# Patient Record
Sex: Female | Born: 1960 | Race: Black or African American | Hispanic: No | Marital: Married | Smoking: Former smoker
Health system: Southern US, Community
[De-identification: ages and names within clinical notes are randomized; demographics above are authoritative.]

## PROBLEM LIST (undated history)

## (undated) DIAGNOSIS — I4891 Unspecified atrial fibrillation: Secondary | ICD-10-CM

## (undated) DIAGNOSIS — I219 Acute myocardial infarction, unspecified: Secondary | ICD-10-CM

## (undated) DIAGNOSIS — I4892 Unspecified atrial flutter: Secondary | ICD-10-CM

## (undated) DIAGNOSIS — E7800 Pure hypercholesterolemia, unspecified: Secondary | ICD-10-CM

## (undated) DIAGNOSIS — I10 Essential (primary) hypertension: Secondary | ICD-10-CM

## (undated) DIAGNOSIS — I509 Heart failure, unspecified: Secondary | ICD-10-CM

## (undated) DIAGNOSIS — Z9289 Personal history of other medical treatment: Secondary | ICD-10-CM

## (undated) DIAGNOSIS — E669 Obesity, unspecified: Secondary | ICD-10-CM

## (undated) DIAGNOSIS — Z951 Presence of aortocoronary bypass graft: Secondary | ICD-10-CM

## (undated) DIAGNOSIS — I2510 Atherosclerotic heart disease of native coronary artery without angina pectoris: Secondary | ICD-10-CM

## (undated) DIAGNOSIS — I214 Non-ST elevation (NSTEMI) myocardial infarction: Secondary | ICD-10-CM

## (undated) HISTORY — DX: Unspecified atrial fibrillation: I48.91

## (undated) HISTORY — DX: Obesity, unspecified: E66.9

## (undated) HISTORY — PX: ABDOMINAL HYSTERECTOMY: SHX81

## (undated) HISTORY — DX: Pure hypercholesterolemia, unspecified: E78.00

## (undated) HISTORY — DX: Personal history of other medical treatment: Z92.89

## (undated) HISTORY — PX: MITRAL VALVE REPAIR: SHX2039

## (undated) HISTORY — DX: Essential (primary) hypertension: I10

## (undated) HISTORY — DX: Presence of aortocoronary bypass graft: Z95.1

## (undated) HISTORY — DX: Atherosclerotic heart disease of native coronary artery without angina pectoris: I25.10

---

## 2000-01-12 ENCOUNTER — Other Ambulatory Visit: Admission: RE | Admit: 2000-01-12 | Discharge: 2000-01-12 | Payer: Self-pay | Admitting: Obstetrics and Gynecology

## 2000-12-27 ENCOUNTER — Ambulatory Visit (HOSPITAL_COMMUNITY): Admission: RE | Admit: 2000-12-27 | Discharge: 2000-12-27 | Payer: Self-pay | Admitting: Obstetrics and Gynecology

## 2000-12-27 ENCOUNTER — Encounter: Payer: Self-pay | Admitting: Obstetrics and Gynecology

## 2000-12-30 ENCOUNTER — Encounter: Admission: RE | Admit: 2000-12-30 | Discharge: 2000-12-30 | Payer: Self-pay | Admitting: Obstetrics and Gynecology

## 2000-12-30 ENCOUNTER — Encounter: Payer: Self-pay | Admitting: Obstetrics and Gynecology

## 2001-07-19 HISTORY — PX: CORONARY ANGIOPLASTY WITH STENT PLACEMENT: SHX49

## 2001-10-13 ENCOUNTER — Encounter: Payer: Self-pay | Admitting: Family Medicine

## 2001-10-13 ENCOUNTER — Encounter: Admission: RE | Admit: 2001-10-13 | Discharge: 2001-10-13 | Payer: Self-pay | Admitting: Family Medicine

## 2002-02-21 ENCOUNTER — Encounter: Payer: Self-pay | Admitting: Interventional Cardiology

## 2002-02-21 ENCOUNTER — Encounter (INDEPENDENT_AMBULATORY_CARE_PROVIDER_SITE_OTHER): Payer: Self-pay | Admitting: Interventional Cardiology

## 2002-02-21 ENCOUNTER — Inpatient Hospital Stay (HOSPITAL_COMMUNITY): Admission: EM | Admit: 2002-02-21 | Discharge: 2002-02-25 | Payer: Self-pay | Admitting: Emergency Medicine

## 2005-07-19 DIAGNOSIS — I219 Acute myocardial infarction, unspecified: Secondary | ICD-10-CM

## 2005-07-19 HISTORY — DX: Acute myocardial infarction, unspecified: I21.9

## 2006-03-19 HISTORY — PX: CORONARY ARTERY BYPASS GRAFT: SHX141

## 2006-03-25 ENCOUNTER — Encounter (INDEPENDENT_AMBULATORY_CARE_PROVIDER_SITE_OTHER): Payer: Self-pay | Admitting: Interventional Cardiology

## 2006-03-25 ENCOUNTER — Inpatient Hospital Stay (HOSPITAL_COMMUNITY): Admission: EM | Admit: 2006-03-25 | Discharge: 2006-04-01 | Payer: Self-pay | Admitting: Emergency Medicine

## 2006-03-28 ENCOUNTER — Encounter: Payer: Self-pay | Admitting: Vascular Surgery

## 2006-03-29 ENCOUNTER — Ambulatory Visit: Payer: Self-pay | Admitting: Dentistry

## 2006-03-30 ENCOUNTER — Ambulatory Visit: Payer: Self-pay | Admitting: Dentistry

## 2006-04-06 ENCOUNTER — Inpatient Hospital Stay (HOSPITAL_COMMUNITY): Admission: RE | Admit: 2006-04-06 | Discharge: 2006-04-13 | Payer: Self-pay | Admitting: Cardiothoracic Surgery

## 2006-08-19 ENCOUNTER — Encounter: Admission: RE | Admit: 2006-08-19 | Discharge: 2006-08-19 | Payer: Self-pay | Admitting: Family Medicine

## 2006-08-25 ENCOUNTER — Ambulatory Visit (HOSPITAL_COMMUNITY): Admission: RE | Admit: 2006-08-25 | Discharge: 2006-08-25 | Payer: Self-pay | Admitting: Family Medicine

## 2006-08-25 ENCOUNTER — Ambulatory Visit: Payer: Self-pay | Admitting: Vascular Surgery

## 2008-09-13 ENCOUNTER — Encounter: Admission: RE | Admit: 2008-09-13 | Discharge: 2008-09-13 | Payer: Self-pay | Admitting: Obstetrics and Gynecology

## 2010-08-09 ENCOUNTER — Encounter: Payer: Self-pay | Admitting: Family Medicine

## 2010-10-28 ENCOUNTER — Other Ambulatory Visit: Payer: Self-pay | Admitting: Obstetrics and Gynecology

## 2010-10-28 DIAGNOSIS — Z1231 Encounter for screening mammogram for malignant neoplasm of breast: Secondary | ICD-10-CM

## 2010-11-03 ENCOUNTER — Ambulatory Visit: Payer: Self-pay

## 2010-12-04 NOTE — Cardiovascular Report (Signed)
NAMEJENAY, [name] NO.:  [id number]   MEDICAL RECORD NO.:  [id number]          PATIENT TYPE:  INP   LOCATION:  3307                         FACILITY:  [facility]   PHYSICIAN:  Lyn Records, M.D.   DATE OF BIRTH:  Dec 11, 1960   DATE OF PROCEDURE:  03/28/2006  DATE OF DISCHARGE:                              CARDIAC CATHETERIZATION   INDICATIONS FOR PROCEDURE:  The patient is 31 and has a prior history of  anterior myocardial infarction in 2003 treated with angioplasty and  stenting.  Over the past 2 years, she has been off of medical therapy  because she has lost her insurance and has been unable to afford her  medications.  For the past 2-3 weeks, she has noticed increasing shortness  of breath and chest tightness and was admitted to the hospital on March 25, 2006, because of CHF and chest discomfort.  Evaluation while hospitalized  demonstrated clinical evidence of significant mitral regurgitation which was  confirmed by echocardiography which demonstrated 3-4+ MR.  There is also a  new lateral wall motion abnormality.  This study is being done to define  coronary anatomy.   PROCEDURES PERFORMED:  1. Left heart catheterization.  2. Selective coronary angiography..  3. Left ventriculography.  4. Right heart catheterization.   DESCRIPTION:  After informed consent, a 6-French sheath was placed in the  right femoral artery using modified Seldinger technique, and a 7-French  sheath in the right femoral vein, again using modified Seldinger technique.  Both sheaths were placed for local Xylocaine infiltration.   Right heart catheterization was performed with a 6-French balloon-tipped  Swan-Ganz catheter.  Thermodilution cardiac outputs were obtained, and Fick  outputs were obtained from the Swan-Ganz catheter and also from an A2  multipurpose catheter which was used for right heart catheterization.  A2  catheter had been advanced to the ascending aorta over a  guidewire.   After hemodynamic measurements were made, we performed selective coronary  angiography using the A2 multipurpose catheter.  We then performed left  ventriculography using an angled pigtail catheter; 40 mL of contrast at 12  mL per second was administered.  This demonstrated severe mitral  regurgitation and decreased LV function.  The procedure was then terminated.  The patient was taken to the holding area for manual compression for  hemostasis.   RESULTS:  1. Hemodynamic data.      a.     Left ventricular pressure 143/27.      b.     Aortic pressure 143/103.      c.     Right atrial mean pressure 15 mmHg.[name]      d.     Right ventricular pressure 64/69.      e.     Pulmonary artery pressure 63/32.      f.     Pulmonary capillary wedge mean pressure 29 with a V-wave to 48       mmHg.      g.     Cardiac output by thermodilution 2.4 liters per minute and by  Fick 2.3 liters per minute.   1. Left ventriculography:  Left ventricle is mildly dilated.  There is      distal inferior wall severe hypokinesis as well as distal anteroapical      hypokinesis.  EF is estimated to be 30-40%.  Mitral regurgitation is      3+.   1. Coronary angiography.      a.     Left main coronary:  Widely patent.      b.     Left anterior descending coronary:  This is large vessel that       wraps around left ventricular apex and gives origin to two diagonals.       The second diagonal is a large vessel.  The LAD contains proximal 50-       60% narrowing.  The mid LAD, which contains a drug-eluting stent,       contains mild to moderate in-stent restenosis with up to 50%       narrowing.  The first diagonal contains a mid vessel 50% narrowing.       The LAD, just at the proximal margin of the stent in the mid LAD,       contains a saccular aneurysm..      c.     Circumflex artery:  Circumflex is totally occluded after a small       first obtuse marginal branch.  The first OM contains an  ostial 70%       narrowing.      d.     Right coronary:  This vessel was moderately diffusely diseased       with 50-60% narrowing proximally and in the mid vessel.   CONCLUSION:  1. Severe mitral regurgitation that is ischemic in origin secondary to      papillary muscle dysfunction.  The mitral regurgitation is greater 3+.      The consequence of this is pulmonary hypertension.  Her presentation      with congestive heart failure is certainly due in part to the severity      of the mitral regurgitation, but also decreased left ventricular      function has some impact.  2. Moderately severe coronary artery disease with totally occluded mid      circumflex, 60-70% obtuse marginal #1, 50-60% proximal and mid left      anterior descending (including in-stent restenosis of the Cypher      stent), and moderate right coronary disease.  3. Decreased left ventricular function with ejection fraction in the 30-      40% range depending upon views and technique used to assess left      ventricle.  The patient has had both an anterior and inferolateral      myocardial infarction in the past.  The inferolateral infarction is the      cause of the patient's severe mitral regurgitation.  4. Pulmonary hypertension secondary to systolic and diastolic dysfunction      as well as severe mitral regurgitation.   PLAN:  1. Intensification of medical therapy.  2. CVTS evaluation for consideration of coronary artery bypass grafting      and mitral valve repair.  3. The patient may need transesophageal echo for further evaluation.      Lyn Records, M.D.  Electronically Signed     HWS/MEDQ  D:  03/28/2006  T:  03/28/2006  Job:  [id number]   cc:   Bryan Lemma. Manus Gunning, M.D.  CVTS

## 2010-12-04 NOTE — Cardiovascular Report (Signed)
[name], [name]                            ACCOUNT NO.:  [id number]   MEDICAL RECORD NO.:  [id number]                   PATIENT TYPE:  INP   LOCATION:  6533                                 FACILITY:  [facility]   PHYSICIAN:  Lesleigh Noe, M.D.            DATE OF BIRTH:  1961/03/20   DATE OF PROCEDURE:  02/22/2002  DATE OF DISCHARGE:                              CARDIAC CATHETERIZATION   INDICATIONS FOR PROCEDURE:  Recent apical infarction documented by EKG and 2-  D echocardiogram.   PROCEDURE PERFORMED:  1. Left heart catheterization.  2. Selective coronary angiography.  3. Left ventriculography.  4. Stent LAD.   DESCRIPTION OF PROCEDURE:  After informed consent, a 6 French sheath was  placed in the right femoral artery using the modified Seldinger technique.  A 6 French _________  catheter  was used for hemodynamic quadrants, left  ventriculography by hand injection, left-to-right selective coronary  angiography.   After studying the cineangiograms, it was felt that intervention on the LAD  which was a covered vessel was indicated.  We changed to a 6 [place]  guide catheter using BMW 0.14 short wire and gave the patient a bolus  followed by an infusion of Angiomax.  The ACT was documented to be greater  than 300 seconds.  An angioplasty was then performed using a 2.5 mm 9 mm  long Maverick balloon.  After predilatation we placed a 13 mm long 2.5 mm  diameter Cyper drug eluting stent to 11 atmospheres.  Because there appeared  to be inadequate stent expansion, we upgraded to a 2.75 mm Maverick 9 mm  long balloon and expanded to 14 atmospheres which was an effective diameter  of 3.1 mm.  With this expansion there appeared to be better stent  apposition.  There was noticeable thrombus in the septal perforator.  TIMI  flow was graded at 2.5 to 3.  The patient was stable without chest pain post  procedure.  Post stent angiographic appearance in the LAD was less than  10%.  ACT post procedure 284 seconds.  A single bolus followed by an infusion of  Integrelin was started because of what appeared to be thrombus within the  septal perforator. The patient tolerated the procedure without  complications.   RESULTS:  1. Hemodynamic data:     a. Aortic pressure 145/89.     b. Left ventricular pressure 143/21 mmHg.  2. Left ventriculography:  There is enteral apical akinesis with an apical     thrombus noted.  Ejection fraction is 55 to 60%.  3. Selective coronary angiography.     a. Left main coronary:  Normal.     b. Left anterior descending coronary artery:  The left anterior        descending gives origin in two diagonal branches, the second diagonal        branch is large and contains 50  to 70% mid stenosis.  The proximal and        mid left anterior descending contain luminal irregularities after the        second septal perforator.  There is 99% stenosis in the left anterior        descending. There is TIMI grade 1 to 2 flow and luminal irregularities        noted beyond the stenosis within the left anterior descending.  The        left anterior descending wraps around the left ventricular apex.     c. Circumflex artery:  Circumflex coronary artery gives origin to three        obtuse marginal branches.  The third obtuse marginal trifurcation on        the inferior wall.  There is an eccentric 50% mid circumflex stenosis        beyond the first obtuse marginal.     d. Right coronary artery:  The right coronary artery contains luminal        irregularities but no significant obstruction. The right coronary        artery is dominant.  4. Percutaneous coronary intervention:  The left anterior descending is     reduced from 99% to less than 10% with TIMI grade 3 flow.  Despite     expansion of the stent to 3.1 mm, there still appears to be some mild     recoil within the stented region, although flow is brisk. There also     appears to be some thrombus  within the ostium of the septal perforator     over which the proximal portion of the stent is deployed.   CONCLUSION:  1. Successful left anterior descending stent from 99% to less than 10% with     TIMI grade 3 flow.  Stenting was done with a drug eluting Cypher stent to     3.1 mm diameter effective orifice.  2. Normal overall left ventricular function with apical akinesis and apical     thrombus noted.  3. Moderate circumflex and mild right coronary disease.   PLAN:  1. Integrelin x18 to 24 hours because of what appears to be thrombus in the     septal perforator.  2. Aspirin and Plavix for three months.  3. Coumadin therapy will be started for the apical thrombus.  4. Close clinical follow-up.                                               Lesleigh Noe, M.D.    HWS/MEDQ  D:  02/22/2002  T:  02/27/2002  Job:  [id number]   cc:   Desma Maxim, M.D.

## 2010-12-04 NOTE — H&P (Signed)
[name], [name]                            ACCOUNT NO.:  [id number]   MEDICAL RECORD NO.:  [id number]                   PATIENT TYPE:  EMS   LOCATION:  MAJO                                 FACILITY:  [facility]   PHYSICIAN:  Salomon Fick, N.P.                 DATE OF BIRTH:  04-19-1961   DATE OF ADMISSION:  02/21/2002  DATE OF DISCHARGE:                                HISTORY & PHYSICAL   PRIMARY CARE PHYSICIAN:  Desma Maxim, M.D.   IMPRESSION:  As dictated by Lyn Records, M.D.  1. Status post anterior myocardial infarction.  Initial event likely nine     days prior to admission.  The first set of cardiac enzymes was elevated,     but likely reflecting tracking downward from initial event.  The EKG was     consistent with old MI in evolution.  A 2-D echocardiogram revealed     severe apical hypokinesis with low-end normal ejection fraction.  The     patient continues with mild left arm tingling and left subscapular     discomfort which is without change from the last nine days.  2. Hypertension.  Good control of blood pressure on current medical regimen.  3. History of tobacco abuse.   PLAN:  As dictated by Lyn Records, M.D.  1. Admit to telemetry.  Continue with serial cardiac enzymes x 3 with daily     EKG.  2. Aspirin, IV nitrates, IV heparin, and Plavix.  3. Cardiac catheterization tomorrow with possible percutaneous intervention     if indicated and able.  The risks, potential complications, benefits, and     alternatives of the procedure were discussed in detail.  The patient has     indicated that her questions and concerns have been addressed and is     agreeable to proceed.   HISTORY OF PRESENT ILLNESS:  The patient is a very pleasant 50 year old with  a history of tobacco use.  Nine days prior to admission, she had an episode  of sudden onset of mid substernal dull achiness which was constant with  subsequent left side of the head, left jaw, left face,  left arm, and left  leg down to feet achiness.  She had associated symptoms of presyncope,  weakness, nausea, diaphoresis, and palpitations.  She denies shortness of  breath.  She felt like she would pass out if she laid down.  She rated the  pain an 8/10 on scale.  The discomfort lasted about 45 minutes.  She had  residual persistent left arm tingling with slight achiness and left  subscapular achiness as well, nonpleuritic and not musculoskeletal.  The  left subscapular ache seemed to be exacerbated with movement of the left  arm.  She had recurrent episode that next day and a latter episode four days  prior to admission.  Otherwise  she feels okay, except for residual left arm  tingling and left subscapular achiness _________  last nine days.  She  presented to her primary M.D. today.  Her EKG changes were consistent with  MI in evolution, inferior/anterior/lateral leads.  A 2-D echocardiogram here  in the emergency room revealed severe hypokinesis of her apex with an EF of  approximately 50%.   PAST MEDICAL HISTORY:  1. Hypertension.  2. Tobacco abuse.   PAST SURGICAL HISTORY:  1. C-section.  2. Hysterectomy without BSO.   ALLERGIES:  No known drug allergies.  Okay with seafood, shellfish, and  iodine products.   MEDICATIONS:  1. Lotensin/HCTZ 20/12.5 mg p.o. q.d.  2. Clarinex one p.o. q.d. for the last two months.  3. Nexium p.r.n.   SOCIAL HISTORY:  Tobacco Use:  One pack per day for the 23 years.  ETOH:  Occasional weekend light use.  She works out of her home for [employer].  Married for 23 years.  A 30 year old son, alive and well.   FAMILY HISTORY:  Her mother is age 33 with no history of CAD and positive  hypertension.  Her father died at age 15 and had cardiac problems and  history of multiple strokes.  Ten siblings, none with CAD.   REVIEW OF SYSTEMS:  Wears glasses.  Episodic GERD.  Otherwise the total  review of systems was benign.   PHYSICAL  EXAMINATION:  As performed by Lyn Records, M.D.  VITAL SIGNS:  Blood pressure 146/102, heart rate 72 and regular, respiratory  rate 20, temperature 97.9 degrees.  GENERAL APPEARANCE:  She is a well-nourished, pleasantly conversant, middle-  aged female in no acute distress with some residual left arm tingling and  left subscapular achiness.  NECK:  Brisk bilateral carotid upstroke without bruit.  No JVD.  No  thyromegaly.  CHEST:  Lung sounds clear with equal bilateral excursion.  CARDIAC:  Regular rate and rhythm without murmur, rub, or gallop.  She does  have a soft S4.  ABDOMEN:  Soft and nondistended.  Normoactive bowel sounds.  Negative  abdominal aorta, renal, or femoral bruit.  Nontender to applied pressure.  No masses.  No organomegaly appreciated.  VASCULAR:  Negative pedal edema.  Distal pulses intact.  NEUROLOGIC:  cranial nerves II-XII grossly intact.  Alert and oriented x 3.  GENITALIA:  Deferred.  RECTAL:  Deferred.   LABORATORY DATA:  The EKG reveals changes consistent with old MI in  evolution involving inferior/anterior/lateral areas.   First CK of 258, MB fraction 11.8, relative index 4.6, and troponin I  elevated at 1.4.   The 2-D echocardiogram revealed severe hypokinesis of the apex, EF low  normal, and otherwise appeared normal.   WBC 9.1, hemoglobin 14.9, hematocrit 43.8, platelets 245.  PT 12.4, INR 0.9,  PTT 27.  Sodium 138, K 3.7, chloride 103, CO2 28, glucose 89, BUN 9,  creatinine 0.9.  LFTs all within normal range, though slightly elevated  alkaline phosphatase at 120.                                               Salomon Fick, N.P.    MES/MEDQ  D:  02/21/2002  T:  02/25/2002  Job:  [id number]

## 2010-12-04 NOTE — Op Note (Signed)
[name], [name]                ACCOUNT NO.:  [id number]   MEDICAL RECORD NO.:  [id number]          PATIENT TYPE:  INP   LOCATION:  3728                         FACILITY:  [facility]   PHYSICIAN:  Charlynne Pander, D.D.S.DATE OF BIRTH:  Dec 16, 1960   DATE OF PROCEDURE:  03/30/2006  DATE OF DISCHARGE:                                 OPERATIVE REPORT   PREOPERATIVE DIAGNOSES:  1. Severe mitral regurgitation.  2. Pre-Mitral valve replacement/repair dental protocol.  3. Coronary artery disease.  4. Chronic apical periodontitis.  5. Chronic periodontitis.  6. Multiple retained root segments.  7. Bilateral mandibular lingual tori.  8. Accretions.   POSTOPERATIVE DIAGNOSES:  1. Severe mitral regurgitation.  2. Pre-Mitral valve replacement/repair dental protocol.  3. Coronary artery disease.  4. Chronic apical periodontitis.  5. Chronic periodontitis.  6. Multiple retained root segments.  7. Bilateral mandibular lingual tori.  8. Accretions.   OPERATIONS:  1. Extraction of tooth numbers 3, 4, 5, 6, 7, 8, 9, 10, 12, 13, 14, 18,      21, 29 and 31.  2. Four quadrants of alveoloplasty.  3. Gross debridement of the remaining dentition.   SURGEON:  Charlynne Pander, D.D.S.   ASSISTANT:  Elliot Dally (Sales executive).   ANESTHESIA:  General anesthesia via an oral endotracheal tube.   MEDICATIONS:  1. Ancef 1 g IV prior to invasive dental procedures.  2. Local anesthesia with a total utilization of 4 carpules each containing      36 mg of Xylocaine with 0.018 mg of epinephrine as well as 2 carpules      each containing 9 mg of bupivacaine with 0.009 mg of epinephrine.   SPECIMENS:  There were 15 teeth, which were discarded.   COMPLICATIONS:  None.   FLUIDS:  700 mL lactated Ringer solution.   ESTIMATED BLOOD LOSS:  100 mL.   INDICATIONS:  The patient was recently diagnosed with severe mitral  regurgitation as well as coronary artery disease.  Patient with anticipated  mitral valve replacement/repair heart surgery along with a coronary artery  bypass graft procedure with Dr. Donata Clay.  Dental consultation was  requested to rule out dental infection which would affect the patient's  systemic health and anticipated heart valve surgery.  The patient was  examined and treatment planned for multiple extractions with alveoplasty and  gross debridement of the remaining dentition.  Preprosthetic surgery of the  bilateral mandibular lingual tori would be evaluated at the time of surgery.  This treatment plan was formulated, again, to decrease the risks and  complications associated with dental infection from an affecting the  patient's systemic health.   OPERATIVE FINDINGS:  The patient was examined in operating room #10.  The  teeth were identified for extraction.  The patient was noted to be affected  by chronic apical periodontitis, chronic periodontitis, multiple retained  root segments, accretions, and the presence of small bilateral mandibular  lingual tori.  It was determined that the mandibular lingual tori would be  left as is, as a mandibular cast partial denture should be able to be  fabricated around these small mandibular tori.  The aforementioned,  therefore, necessitated the removal of all the remaining upper teeth as well  as tooth numbers 18, 21, 29 and 31 with alveoloplasty and gross debridement  of the remaining dentition.  No mandibular tori reductions would be  performed at this time.   DESCRIPTION OF PROCEDURE:  The patient was brought to the main operating  room #10.  The patient was then placed in a supine position on the operating  room table.  General anesthesia was induced via an oral endotracheal tube.  A throat pack was placed at this time.  The patient was then prepped and  draped in usual manner for a dental medicine procedure.  The oral cavity was  thoroughly examined with the findings as noted above.  The patient was then   ready for the dental medicine procedure as follows:   Local anesthesia was administered sequentially over the dental procedure  with a total utilization of 4 carpules each containing 36 mg Xylocaine with  0.018 mg of epinephrine as well as 2 carpules each containing 9 mg of  bupivacaine with 0.009 mg of epinephrine.   The maxillary quadrants were first approached.  Anesthesia was achieved  utilizing the Xylocaine with epinephrine.  A 15 blade incision was made from  the distal of #2 and extended to the distal of #15.  A surgical flap was  then carefully reflected.  The maxillary teeth were then subluxated with a  series of straight elevators.  Appropriate amounts of buccal and interseptal  bone were removed with the surgical handpiece and bur and copious amounts  sterile saline as indicated.  Tooth numbers 3, 4, 5, 6, 7, 8, 9, 10, 12, 13  and 14 were then removed with a 150 forceps without complications.  Alveoplasty was then performed utilizing rongeurs and bone file.  These  surgical sites were then irrigated with copious amounts of sterile saline.  A piece of Surgicel was then placed carefully into each extraction socket  appropriately.  Maxillary right surgical site was then closed utilizing 3-0  chromic gut suture from the distal of #2 and extended to the mesial of #8.  The maxillary left quadrant was then closed from the distal of #15 to the  mesial of #9 utilizing 3-0 chromic gut suture in a continuous interrupted  suture technique x1.   At this point time the mandibular arches were approached.  The patient was  given bilateral inferior alveolar nerve blocks utilizing the bupivacaine  with epinephrine.  Further infiltration was then achieved utilizing  Xylocaine with epinephrine.  A 15 blade incision was made from the distal of  #17 and extended to the mesial of #21.  A surgical flap was then carefully  reflected.  Appropriate amounts of buccal and interseptal bone were  removed around tooth #17 and 21 appropriately.  The coronal segment of tooth #17 was  removed at this time.  Tooth #21 was then removed with a 151 forceps without  complications.  Tooth #17 was then sectioned with a surgical handpiece and  bur and copious amounts sterile saline.  The roots were then elevated out  separately with a Theatre manager.  Alveoplasty was then performed utilizing  rongeurs and bone file.  The surgical site was then irrigated with copious  amounts of sterile saline.  The tissues were approximated and trimmed  appropriately.  A piece of Surgicel was placed in the extraction site of #17  and #21 appropriately.  The surgical site was then closed from the distal of  #17 and extended to the mesial of #21 utilizing 3-0 chromic gut suture in a  continuous interrupted suture technique x1.  The mandibular right quadrant  was then approached.  The root segments in the area of #29 and 31 were then  removed with a rongeur appropriately.  There were no complications.  These  sockets were curetted and compressed appropriately.  The extraction site was  then irrigated with copious amounts of sterile saline.  A piece of Surgicel  was then placed in each extraction socket appropriately.  The surgical site  #29 was then closed utilizing 3 separate interrupted sutures.  The  extraction site #31 was then closed utilizing 2 separate interrupted  sutures.   At this point in time the remaining teeth were approached.  A KaVo [employer]  Scaler was then utilized to remove accretions around tooth numbers 20  through 27 appropriately.  A series of hand curettes were then utilized to  further complete the debridement.  The KaVo [employer] was then again  utilized to refine the removal of the accretions.  At this point in time the  entire mouth was irrigated with copious amounts of sterile saline.  The  patient was examined for complications.  Seeing none, the dental medicine  procedure was  deemed to be complete.  The throat pack was removed at this  time.  A series of 4x4 gauzes was placed in the mouth to aid hemostasis.  The patient was then handed off to the anesthesia team for final  disposition.  After the appropriate amount of time the patient was extubated  and taken to the post anesthesia care unit with stable vital signs and a  good oxygenation level.  All counts were correct for the dental medicine  procedure.  The patient will be given Amicar 5% rinses, rinsing with 10 mL  every hour for the next 10 hours.  This will aid hemostasis and prevent  fibrinolysis.  The patient will then be followed and Lovenox therapy will be  reinstituted per the medical team.  The patient will be followed for healing  and then mitral valve replacement heart surgery will proceed with Dr. Donata Clay as per his scheduling.      Charlynne Pander, D.D.S.  Electronically Signed     RFK/MEDQ  D:  03/30/2006  T:  03/31/2006  Job:  [id number]   cc:   Lyn Records, M.D.  Kerin Perna, M.D.

## 2010-12-04 NOTE — Consult Note (Signed)
[name], [name]                ACCOUNT NO.:  [id number]   MEDICAL RECORD NO.:  [id number]          PATIENT TYPE:  INP   LOCATION:  3728                         FACILITY:  [facility]   PHYSICIAN:  Charlynne Pander, D.D.S.DATE OF BIRTH:  Dec 02, 1960   DATE OF CONSULTATION:  03/29/2006  DATE OF DISCHARGE:                                   CONSULTATION   [name] is a 50 year old female referred by Dr. Kathlee Nations Thomas for  dental consultation.  Patient with recent identification of severe mitral  regurgitation.  Patient with anticipated mitral valve replacement along with  coronary artery bypass graft procedure.  The patient now seen as part of a  pre-heart valve surgery dental protocol evaluation.   MEDICAL HISTORY.:  1. Severe mitral regurgitation requiring mitral valve replacement or      repair with Dr. Donata Thomas.  2. Class IV congestive heart failure.  3. Coronary artery disease.      a.     History of apical MI in 2003.      b.     Status post drug-eluting stent placement to the LAD in 2003 by       Dr. Verdis Thomas.      c.     Recent cardiac catheterization which revealed severe mitral       regurgitation, coronary artery disease and ejection fraction of 30-       40%.      d.     Patient with anticipated mitral valve replacement and coronary       artery bypass graft heart procedure with Dr. Donata Thomas.  4. History of hypertension.  5. Hyperlipidemia.  6. Status post hysterectomy.   ALLERGIES:  ACE INHIBITORS cause a cough.   MEDICATIONS:  1. Aspirin 325 mg daily.  2. Lipitor 40 mg every evening.  3. Coreg 3.125 mg twice daily.  4. Lasix 40 mg twice daily.  5. Heparin per protocol.  6. Avapro 3 mg daily.  7. Imdur 60 mg daily.  8. K-Dur 20 mEq daily.   SOCIAL HISTORY:  The patient is married.  The patient to quit smoking  approximately 2 years ago.  The patient is a nondrinker.  The patient has a  80 year old son.   FAMILY HISTORY:  Is positive for diabetes  mellitus, hypertension and a  stroke.   FUNCTIONAL ASSESSMENT:  The patient was independent for ADLs prior to this  admission.   REVIEW OF SYSTEMS:  This is reviewed from the chart and Health and History  Assessment form for this admission.   DENTAL HISTORY.:   CHIEF COMPLAINT:  The patient needs a pre-heart valve surgery dental  protocol evaluation.   HISTORY OF PRESENT ILLNESS:  Patient with recent identification of severe  mitral regurgitation requiring a mitral valve repair replacement with Dr.  Donata Thomas.  The patient is now seen as part of a pre-heart valve surgery  dental protocol evaluation to rule out dental infection which may affect the  patient's systemic health and anticipated heart valve surgery.   The patient currently denies acute toothache, swellings  or abscesses.  The  patient was last seen approximately 2 years ago to have a tooth extracted.  The patient denies complications from that dental extraction.  The patient  does not have a regular dentist.  The patient has no dentures.   PHYSICAL EXAMINATION:  GENERAL:  The patient is well-developed, well-  nourished female in no acute distress.  VITAL SIGNS:  Blood pressure is 132/83, pulse equal to 104. Respirations are  22, temperature 97.7.  HEAD AND NECK: There is no palpable lymphadenopathy.  There are no acute TMJ  symptoms.   DENTAL EXAM:  Intraoral Exam: The patient has normal saliva.  The patient  has small bilateral mandibular lingual tori. There is no evidence of abscess  formation at this time.  DENTITION.  Patient with multiple missing teeth.  Patient with multiple  retained root segments.  PERIODONTAL: Patient with chronic advanced periodontal disease with plaque  and calculus accumulations, generalized gingival recession, generalized  tooth mobility, and moderate to severe bone loss.  DENTAL CARIES:  There are dental caries noted within the mouth.  CROWN OR BRIDGE:  There are no crown or bridge  restorations.  ENDODONTIST: The patient currently denies acute pulpitis symptoms.  The  patient does have multiple areas of chronic apical periodontitis.  PROSTHODONTIC: There are no dentures.  OCCLUSION: Patient with a poor occlusal scheme secondary to multiple missing  teeth, multiple retained root segments, supereruption and drifting of the  unopposed teeth into the edentulous areas, and lack of replacement of the  missing teeth with dental prosthesis.  [name]   RADIOGRAPHIC IRRITATION:  Panoramic x-ray was taken in the Department of  Radiology on March 29, 2006.   There are multiple missing teeth.  There are multiple retained root  segments.  There is moderate to severe bone loss.  There are dental caries  noted.  There is poor occlusal scheme noted.  There is evidence of chronic  apical periodontitis.   ASSESSMENT:  1. Plaque and calculus accumulations.  2. Chronic periodontitis bone loss.  3. Gingival recession.  4. Tooth mobility.  5. Multiple missing teeth.  6. Multiple retained root segments.  7. Dental caries.  8. Bilateral mandibular lingual tori.  9. Supereruption and drifting of the unopposed teeth into the edentulous      areas.  10.Poor occlusal scheme.  11.History of oral neglect.  12.Heparin therapy with a risk for bleeding with invasive dental      procedures.  13.Need for antibiotic premedication prior to invasive dental procedures.   PLAN AND RECOMMENDATIONS:  1. I have discussed the risks, benefits, complications of various      treatment option with the patient in relationship to patient's medical      and dental conditions and anticipated heart valve surgery, and risk for      subacute bacterial endocarditis.  We discussed various treatment      options to include no treatment, total and subtotal extractions with      alveoplasty, periodontal therapy, dental restorations, crown or bridge     therapy, root canal therapy, implant therapy, replacing missing  teeth      as indicated after adequate healing.  Patient currently wishes to      proceed with extraction of the remaining upper teeth and the lower      teeth with the exception of tooth numbers 22-27.  The patient also      agrees to alveoloplasty, preprosthetic surgery as indicated, and gross  debridement of the remaining dentition.  This will take place in the      operating room under general anesthesia.  Patient is aware of the      significant risks up to including death due to significant      cardiovascular compromise.  The operating room procedure will be      scheduled as soon as possible pending operating room availability.  2. Discussion of findings with Dr. Verdis Thomas and Dr. Kathlee Nations Thomas as      indicated.      Charlynne Pander, D.D.S.  Electronically Signed     RFK/MEDQ  D:  03/29/2006  T:  03/29/2006  Job:  [id number]   cc:   Kerin Perna, M.D.  Lyn Records, M.D.

## 2010-12-04 NOTE — H&P (Signed)
NAMESHALEKA, BRINES NO.:  [id number]   MEDICAL RECORD NO.:  [id number]          PATIENT TYPE:  INP   LOCATION:  1846                         FACILITY:  [facility]   PHYSICIAN:  Lyn Records, M.D.   DATE OF BIRTH:  1961-04-10   DATE OF ADMISSION:  03/25/2006  DATE OF DISCHARGE:                                HISTORY & PHYSICAL   PRIMARY CARE PHYSICIAN:  Donia Guiles, M.D.   CARDIOLOGIST:  Lyn Records, M.D.   CHIEF COMPLAINT:  Shortness of breath and chest tightness.   HPI:  Ms. Barman is a 50 year old African-American female who is well known  to Dr. Katrinka Blazing.  She has a known history of an anterior MI, single vessel  coronary artery disease status post LAD stent implantation with moderate  circumflex and RCA disease, hypertension, and dyslipidemia.  The patient  reports medication noncompliance for the past year and a half to two years  secondary to financial difficulty.  Today she presented to her primary care  physician's office with a chief complaint of shortness of breath and chest  tightness for the past 2 weeks.  Her primary care physician referred her to  the [facility] Emergency Department.  The shortness of breath and chest  tightness was sudden in onset.  The patient admits to orthopnea (sleeping in  a chair or on her knees for the past week), PND for the past week, lower  extremity edema, however denies short term weight gain.  She also admits to  a nonproductive cough, denies fever or chills.  She denies a previous  history of this problem.  The patient's chest tightness is located above her  left breast and radiates to the left shoulder and back.  She admits to heat  (questionable diaphoresis), nausea and vomiting for the past 4 days without  diarrhea, and dizziness (lightheadedness), however, denies syncope.  Chest  tightness is rated a 4/10 and is described as a dull ache.  She admits to  chest tightness upon arrival to the emergency department  today; however,  admits that the tightness is less severe.  There is no change in the chest  tightness with movement or meals.  Upon arrival to the emergency department,  a 12 lead EKG was obtained and revealed normal sinus rhythm at 89 b.p.m.  with nonspecific ST-T wave abnormalities.  No ischemic changes were noted.  Point of care enzymes were obtained and were negative x1 except a mildly  elevated troponin I at 0.10.  A chest x-ray was obtained and revealed  bibasilar atelectasis with airspace disease right greater than left.  Right  lower lobe pneumonia could not be excluded.  Normal LV systolic function  with an EF of 55-65%.  Severe hypokinesis of apical anterior wall.  LV wall  thickness mildly to moderately increased via 2D echocardiogram February 21, 2002.   PAST MEDICAL HISTORY:  1. Status post anterior MI August, 2003.  2. Single vessel coronary artery disease status post LAD stent      implantation with moderate circumflex and RCA disease February 22, 2002.  3. Hypertension.  4. Dyslipidemia.  5. History of tobacco abuse with cessation in 2004.   ALLERGIES:  ACE INHIBITOR - COUGH.   MEDICATIONS:  None.   PAST SURGICAL HISTORY:  1. Cesarean section.  2. Partial hysterectomy without BSO.   FAMILY HISTORY:  1. Father deceased age 83, hypertension, diabetes mellitus, CVA.  2. Mother living, age 51, hypertension.  3. Ten siblings - none with coronary artery disease.  4. Son age 68, healthy.   SOCIAL HISTORY:  Married.  Lives with husband and son.  Employed for a [employer].  Former tobacco abuse with a 25 pack year history with  smoking cessation in 2004.  Positive occasional alcohol use on the weekends.  Denies illicit drug use and a consistent exercise regimen.   REVIEW OF SYSTEMS:  All other systems reviewed are negative, other than what  is stated in the HPI.   PHYSICAL EXAM:  GENERAL:  A 50 year old female, pleasant and cooperative,  NAD.  VITALS:   Temperature 97.1, pulse 87, blood pressure 159/115, repeat blood  pressure 143/98, respiration 28, repeat respirations 24, O2 saturation 98%  over 2L.  HEENT:  Unremarkable.  NECK:  Supple without JVD, carotid bruits bilaterally, or thyromegaly.  Carotid upstrokes 2+.  LUNGS:  Bilateral crackles throughout.  HEART:  Regular rate and rhythm.  Normal S1 and S2 with a I/VI systolic  murmur.  ABDOMEN:  Soft, nontender, nondistended with active bowel sounds.  No  masses, hepatomegaly, or bilateral bruits noted.  EXTREMITIES:  No peripheral edema.  DP and PT pulses 2+/2 bilaterally.  SKIN:  Warm and dry without rashes or lesions.  NEURO:  Alert and oriented x3.  No focal deficits.  PSYCH:  Normal mood and affect.  BACK:  No kyphosis or scoliosis.   LABORATORY DATA:  White blood count 8.6, hemoglobin 12.9, hematocrit 38.7,  platelets 298.  Sodium 138, potassium 3.7, chloride 106, bicarbonate 22.5,  BUN 12, creatinine 1.1, glucose 103.  Point of care enzymes:  Myoglobin  72.8, CK MB 2.6, troponin I 0.1.  BNP, PT, PTT, INR, and D-dimer are  pending.  EKG and chest x-ray as stated in the HPI.   ASSESSMENT:  1. Dyspnea.  2. Chest tightness consistent with unstable angina.  3. Nonproductive cough.  Afebrile.  4. Nausea.  5. Vomiting.  6. Hypertension.  7. Heart murmur.  8. Coronary artery disease status post left anterior descending stent      implantation with moderate circumflex and right coronary artery      disease.  9. Status post anterior myocardial infarction.  10.Dyslipidemia.  11.History of tobacco abuse with cessation in 2004.  12.Medication noncompliance secondary to financial issues.   PLAN:  1. Admit to cardiac telemetry unit under the service of Dr. Verdis Prime.  2. Obtain cardiac panel with troponin-I every 8 hours x3.  First set now.  3. Diet 2 g sodium modified.  4. Medications.      a.     Start aspirin 325 mg daily.      b.     IV Lasix 40 mg q.12h.     c.      K-Dur 20 mEq now and daily.      d.     Coreg 3.125 mg twice daily.  Hold for systolic blood pressure       less than 100 mmHg or heart rate less than 60.      e.     Lipitor 40 mg  daily.  5. Obtain daily weights and strict I/O's.  6. Initiate cardiology p.r.n. orders.  7. Oxygen at 2L per minute.  8. Admit to Step Down Unit.  9. Obtain a STAT 2D echocardiogram for the diagnosis of dyspnea and heart      murmur.  10.Check FLP, EKG, BMET, and CBC in the a.m.  11.The patient was seen, interviewed and examined by Dr. Verdis Prime who      participated in the medical decision-making and plan of care.      Tylene Fantasia, [place]      Lyn Records, M.D.  Electronically Signed    RDM/MEDQ  D:  03/25/2006  T:  03/25/2006  Job:  [id number]   cc:   Donia Guiles, M.D.

## 2010-12-04 NOTE — Discharge Summary (Signed)
[name], [name]                ACCOUNT NO.:  [id number]   MEDICAL RECORD NO.:  [id number]          PATIENT TYPE:  INP   LOCATION:  2025                         FACILITY:  [facility]   PHYSICIAN:  Kerin Perna, M.D.  DATE OF BIRTH:  [birth year]   DATE OF ADMISSION:  04/06/2006  DATE OF DISCHARGE:  04/13/2006                                 DISCHARGE SUMMARY   ADMIT DIAGNOSIS:  Coronary artery disease and mitral regurgitation.   PAST MEDICAL HISTORY AND DISCHARGE DIAGNOSES:  1. Coronary artery disease status post drug-alluding stent to the LAD in      2003.  Status post coronary artery bypass grafting x4.  2. Ischemic mitral regurgitation status post mitral valve annuloplasty.  3. Ischemic cardiomyopathy.  4. Hypertension.  5. Hyperlipidemia.  6. History of tobacco abuse.  7. Hysterectomy.  8. Postoperative left pleural effusion status post ultrasound-guided      thoracentesis.  9. Pulmonary hypertension.   ALLERGIES:  ACE INHIBITORS CAUSE A COUGH.   BRIEF HISTORY:  The patient is a 50 year old African-American female with a  history of myocardial infarction in 2003 treated with PCI of the LAD.  She  recently returned with symptoms of CHF and was found to be in significant  class IV heart failure with reduced left ventricular function and ejection  fraction of 20% with 3+ moderate to severe mitral regurgitation.  The  patient was admitted and stabilized.  She was then taken for cardiac cath on  March 28, 2006 which demonstrated multi-vessel coronary artery disease  and moderate to severe mitral regurgitation.  Secondary to these findings,  Dr. Kathlee Nations Trigt of the CVTS service was consulted and he evaluated the  patient on March 28, 2006.  It was his opinion the patient should  proceed with coronary artery bypass grafting as well as mitral valve repair,  however, the patient would require a dental examination prior to valve  surgery.  This is carried out and the  patient underwent several tooth  extractions.  In order to give the patient time to heal from her oral  surgery, she was allowed to be discharged home.  The patient was then  readmitted for cardiac surgery.   HOSPITAL COURSE:  The patient was admitted and taken to the OR on April 06, 2006 for coronary artery bypass grafting x4 and a mitral valve  annuloplasty with a 26 mm Edwards Ring.  The left internal mammary artery  was grated to the LAD, saphenous vein was grafted to the diagonal, saphenous  vein was grafted to the diagonal, saphenous vein was grafted to the  circumflex marginal, and saphenous vein was grated to the right coronary  artery.  Endoscopic vessel harvesting was performed of the right lower  extremity.  The patient tolerated the procedure well, was hemodynamically  stable immediately postoperatively.  The patient was transferred from the OR  to the [facility] in stable condition.  The patient was extubated without  complication and woke up from anesthesia neurologically intact.   The patient's postoperative course progressed as expected.  On postoperative  day one  she was afebrile with stable vital signs and maintaining a normal  sinus rhythm.  All invasive lines and chest tubes were discontinued in a  routine manner.  The patient has been volume overloaded and has been  diuresed accordingly.  The patient began cardiac rehab on postoperative day  two and has continued to increase her tolerance with a satisfactory level at  this time.  She was noted to have a left pleural effusion on postoperative  day three.  This was monitored for several days and did require a left  thoracentesis in interventional radiology on March 22, 2006.  The patient  tolerated this procedure well and has experienced no difficulties from it.   The remainder of the patient's postoperative course progressed without  difficulty.  On postoperative day seven she is ambulating well and her bowel   function has returned.  She is without complaint.  She is afebrile with  stable vital signs and maintained on normal sinus rhythm.   PHYSICAL EXAMINATION:  Cardiac is regular rate and rhythm.  There are  decreased breath sounds in the bilateral bases.  Abdomen is benign.  Extremities reveal positive edema, right greater than left.  Incision is  clean, dry, and intact.  The patient has recovered well.  Her postoperative  pleural effusion and thoracentesis.  She continues to be volume overloaded  and require further diuresis as an outpatient.  The patient has progressed  and is in stable condition for discharge at this time.   LABS:  INR on April 13, 2006 2.3.  BMP on April 11, 2006.  Sodium  155, potassium 0.6, BUN 17, creatinine 1.1  CBC on April 10, 2006 white  count is 12.5, hemoglobin 9.7, hematocrit 28.5, platelets 230.   CONDITION ON DISCHARGE:  Improved.   INSTRUCTIONS:   MEDICATIONS:  1. Aspirin 81 mg daily.  2. Coreg 6.25 mg b.i.d.  3. Avapro 150 mg daily.  4. Lipitor 40 mg nightly.  5. Amiodarone 200 mg b.i.d.  6. Coumadin 2.5 mg nightly.  7. Lasix 40 mg daily x7 days.  8. K-Dur 20 mEq daily for seven days.  9. Tylox one to two q.4h-q.6h. p.r.n. pain.   The patient was given specific instructions regarding activity, diet, and  wound care.   FOLLOWUP:  1. PT INR blood work on April 18, 2006 at [facility] at 2:30 p.m.  2. EKG on April 19, 2006 at 9:30 a.m. at [facility].  3. Chest x-ray at [facility] on April 27, 2006 at 10 a.m.  4. Dr. Katrinka Blazing on April 27, 1006 at 10:30 a.m.  5. Dr. Donata Clay three weeks after discharge.  His office will consult the      patient with the date and time of that appointment.      Pecola Leisure, [place]      Kerin Perna, M.D.  Electronically Signed    AY/MEDQ  D:  04/13/2006  T:  04/13/2006  Job:  [id number]   cc:   Kerin Perna, M.D. Katrinka Blazing, M.D.

## 2010-12-04 NOTE — Discharge Summary (Signed)
[name], [name]                            ACCOUNT NO.:  [id number]   MEDICAL RECORD NO.:  [id number]                   PATIENT TYPE:  INP   LOCATION:  3742                                 FACILITY:  [facility]   PHYSICIAN:  Lyn Records, MD                  DATE OF BIRTH:  11-18-1960   DATE OF ADMISSION:  02/21/2002  DATE OF DISCHARGE:  02/25/2002                                 DISCHARGE SUMMARY   PROCEDURES:  1. (02/21/2002) A 2-D echocardiogram revealing severe apical hypokinesia.  Low     to normal ejection fraction.  2. (02/22/2002)  Stent to the left anterior descending artery with Cypher drug     eluting stent.  Left ventriculogram was notable for apical akinesia with     thrombus.  Left vein normal, left anterior descending artery 99% mid     after ST #1, 70 diagonal, circumflex 50 to 70% mid stenosis.  Right     coronary artery had luminal irregularities.   DISCHARGE DIAGNOSES:  1. Status post apical wall myocardial infarction with initial event     approximately nine days prior to admission.  The first set of cardiac     enzymes were elevated with CK 58, MB fraction 1.8, troponin I 1.41;     second troponin I elevated at 2.29 but tracking down to CK/MB fraction.     Initial electrocardiogram was consistent with old myocardial infarction     in evolution.  A 2-D echocardiogram in the emergency room revealed severe     apical hypokinesis with low to normal ejection fraction.  The patient was     initially treated with aspirin, intravenous nitrates, intravenous     heparin, and Plavix.  On 02/22/2002, she was taken to the cardiac     catheterization laboratory with results as detailed above.  Underwent     successful percutaneous transluminal coronary angioplasty and drug     eluting Cypher stent mid left anterior descending artery.  Residual 70%     diagonal, 50 to 70% mid circumflex.  She was treated with Integrelin for     approximately 18 hours secondary to thrombus and  ST#1 .  As well, went     with cardiac rehabilitation without problems.  No evidence of congestive     heart failure.  Discharged home on 02/25/2002 in stable condition.  2. Ischemic left ventricular dysfunction secondary to anterior wall     myocardial infarction with left ventriculogram revealing apical akinesia     with thrombus.  Ejection fraction 60%.  She was initially placed on     intravenous heparin.  Post catheterization, started on Coumadin in     anticipation of an three-month course for treatment of the left     ventricular apical thrombus.  At time of discharge, her INR was 1.8.  She     will follow  up with Dawn in our Coumadin clinic with subsequent     adjustment of Coumadin pending INR checks thereafter.  3. Risk factor modification in the setting of coronary artery disease:     History of tobacco abuse at one pack per day for approximately 23 years.     She met with smoking cessation Counsellor who felt patient was in the     contemplation stage.  Counsellor encouraged patient to develop a firm     mind set to quit and will be followed up with private phone call to     patient after discharge.  4. History of hypertension.  Blood pressure within acceptable range during     the course of admission.  5. Insomnia.  Discharged on p.r.n. Ambien.   PLAN:  The patient is discharged home in improved condition.   DISCHARGE MEDICATIONS:  1. (New) Plavix 75 mg 1 p.o. q.d. for six months.  2. (New) Aspirin 81 mg, coated, 1 a day forever.  3. (New) Coumadin 10 mg p.o. on day of discharge (two 5 mg tablets), then     one 5 mg tablet p.o. q.d. thereafter or as otherwise directed.  4. Lotensin/HCTZ 10/12.5 p.o. q.d.  5. (New) Lopressor 50 mg 1/2 tablet p.o. b.i.d.  6. (New) Nitroglycerin tablets 0.4 mg sublingual p.r.n. chest pain, up to 3     tablets in 15 minutes.  7. (New) Ambien 5 mg p.o. q.h.s. p.r.n. insomnia.   DIET:  Low-fat, low-cholesterol, no-added-salt.   SPECIAL  INSTRUCTIONS:  Stop smoking.   FOLLOW UP:  Coumadin clinic at [facility], Tuesday post discharge.  She will follow up with Dr. Katrinka Blazing in approximately two weeks.  (Our office  will call her with that followup office visit.)   HISTORY OF PRESENT ILLNESS:  The patient is an extremely pleasant 50-year-  old female with history of hypertension and tobacco abuse as well as  positive family history for CAD.  On the day prior to admission, she had an  episode of sudden onset mid substernal dull ashiness which was constant with  subsequent left side of head, left jaw, left face, left arm, and left leg  down to feet ashiness with associated symptoms of presyncope, weakness,  nausea, diaphoresis, and palpitations.  She denies shortness of breath.  She  felt like she would pass  out if she did not lie down.  Pain was rated at 8  out of 10 on pain scale.  Discomfort lasted approximately 45 minutes.  She  had residual persistent left arm tingling with slight achiness and left  subscapular achiness as well.  Discomfort was not pleuritic nor  musculoskeletal.  The left subscapular achiness seemed to be exacerbated  with movement of the left arm.  She had a recurrent episode the next day and  later episode four days prior to admission.  Otherwise she felt okay except  for residual left arm tingling and left subscapular achiness, pretty much  consistent over the last nine days.  She saw her primary care Jerad Dunlap where  EKG revealed changes consistent with MI in evolution.  This involved the  inferior, anterior, and lateral leads.  A 2-D echocardiogram in the  emergency room revealed severe hypokinesis of her apex with EF approximately  50%.  Her first CK was elevated at 258, MB fraction 11.8, relative index  4.6, and troponin I elevated 1.4.  She was started on IV nitrates, aspirin,  heparin, and Plavix.  Further details of hospital  admission as above.  LABORATORY TESTS:  Admission WBC 9.1,  hemoglobin 14.9, hematocrit 43.8,  platelets 245.  Admission coags within normal range; discharge pro time  19.7, INR 1.8, PTT 38.  Admission sodium 138, potassium 3.7, chloride 102,  CO2 28, glucose 89, BUN 9, creatinine 0.9.  LFTs all within normal range,  though alkaline phosphatase slightly elevated at 120.  First CK 258, MB  fraction 11.8, MB fraction 1.41.  Second CK 204, MB fraction 7.1, troponin I  2.29.  Cholesterol 167, triglycerides 139, HDL 47, LDL 92.   Admission EKG revealed changes consistent with old MI in evolution  involving inferior, anterior, and lateral leads.  Normal sinus rhythm.   Total time preparing discharge greater than 40 minutes including dictating  discharge summary, filling and reviewing discharge instructions with patient  and setting up followup office visits.      Salomon Fick, NP                         Lyn Records, MD    MES/MEDQ  D:  04/05/2002  T:  04/09/2002  Job:  [id number]   cc:   Desma Maxim, M.D.

## 2010-12-04 NOTE — Op Note (Signed)
NAMEBONNY, VANLEEUWEN                ACCOUNT NO.:  [id number]   MEDICAL RECORD NO.:  [id number]          PATIENT TYPE:  INP   LOCATION:  2316                         FACILITY:  [facility]   PHYSICIAN:  Kerin Perna, M.D.  DATE OF BIRTH:  1961/04/01   DATE OF PROCEDURE:  04/06/2006  DATE OF DISCHARGE:                                 OPERATIVE REPORT   OPERATION:  1. Coronary artery bypass grafting x4 (left internal mammary artery to      left anterior descending, saphenous vein graft to diagonal, saphenous      vein graft to circumflex marginal, saphenous vein graft to right      coronary artery).  2. Mitral valve annuloplasty with a 26-mm Edwards ring, model 4100, serial      number [id number] (McCarthy-Adams).   SURGEON:  Kerin Perna, MD   ASSISTANT:  Danelle Earthly, PA-C   ANESTHESIA:  General by Bedelia Person, MD   PREOPERATIVE DIAGNOSES:  1. Ischemic mitral regurgitation.  2. Severe three-vessel coronary disease.  3. Ischemic cardiomyopathy.   POSTOPERATIVE DIAGNOSES:  1. Ischemic mitral regurgitation.  2. Severe three-vessel coronary disease.  3. Ischemic cardiomyopathy.   INDICATIONS:  The patient is a 50 year old female with a history of an MI in  2003 treated with a PCI of the LAD.  She returned with symptoms of CHF and  was found to be an significant class IV heart failure with reduced LV  function, EF of 20% and 3+ moderate to severe mitral regurgitation.  Cardiac  catheterization demonstrated a recent occlusion of her circumflex with a  subtotal stenosis of the LAD stent and 75% stenosis of the right coronary.  She had pulmonary hypertension by right heart cath and it was felt she would  benefit from surgical revascularization and mitral valve repair.  Prior to  surgery, she was examined by the dentist, who extracted several necrotic  teeth, and while she was recovering from her oral surgery, she was  maintained on Coreg, Avapro, Lasix, and Lipitor.  After recovering  from her  oral surgery, she now presents for surgical coronary revascularization and  mitral valve repair.  I examined the patient in the hospital on several  occasions and reviewed results the cardiac cath and echo with the patient  and discussed CABG with mitral valve repair including the alternatives to  surgery, the details of the operation and the risks involved.  The risks  that we specifically discussed were the risks of MI, CVA, bleeding, blood  transfusion requirement, infection, and death.  After our discussions, she  demonstrated her understanding of the operation and agreed to proceed with  what I felt was an informed consent.   OPERATIVE FINDINGS:  The patient has significant pulmonary hypertension with  PA pressures of 60/30.  She has severe global LV dysfunction and scar was  determined to be present in the anterior and inferior walls.  The  transesophageal echo documented the moderate to severe mitral regurgitation  and after the angioplasty repair, there was no residual mitral  regurgitation.  The patient was given the aprotinin protocol  for this  operation due to her severe LV dysfunction and risk for bleeding.  She  received 2 units of packed cells during the operation to maintain a  hemoglobin over 8 g.   PROCEDURE:  The patient was brought to the operating room and placed supine  on the operating table, where general anesthesia was induced under invasive  hemodynamic monitoring.  The chest, abdomen and legs were prepped with  Betadine and draped as a sterile field.  A sternal incision was made as the  saphenous vein was harvested endoscopically from the right thigh.  The left  internal mammary artery was harvested as a pedicle graft from its origin at  the subclavian vessels.  It was a good vessel with excellent flow.  Heparin  was administered and ACT was documented as being therapeutic.  The sternal  retractor was placed and the pericardium was opened and  suspended.  Pursestrings were placed in the ascending aorta and right atrium and the  patient was cannulated and placed on bypass.  A second pursestring was  placed in the lower right atrium for bicaval venous drainage.  Vesseloops  were placed around the IVC and SVC and the interatrial groove was dissected  out.  The coronaries were identified for grafting and the mammary artery and  vein grafts were prepared for the distal anastomoses.  Cardioplegia  catheters were placed for both antegrade and retrograde cold blood  cardioplegia.  The patient was cooled to 32 degrees and the aortic  crossclamp was applied.  Eight hundred milliliters of cold blood  cardioplegia were delivered in split doses between the antegrade aortic and  retrograde coronary sinus catheters.  There was a good cardioplegic arrest  and the septal temperature dropped to less than 12 degrees.  Topical iced  saline was used to augment myocardial preservation.   The distal coronary anastomoses were then performed.  The first distal  anastomosis was to the distal right coronary.  It was a 1.5-mm vessel and  had a proximal 80% stenosis.  A reversed saphenous vein was sewn end-to-side  with a running 7-0 Prolene and there was good flow through the graft.  The  second distal anastomosis was to the diagonal branch of the LAD.  This a 1.5-  mm vessel and had a proximal 80% stenosis at the origin from the LAD.  A  reversed saphenous vein was sewn end-to-side with a running 7-0 Prolene and  there was good flow through the graft.  Cardioplegia was redosed.  The third  distal anastomosis was to the circumflex marginal.  This had been occluded  proximally.  It was a 1.5-mm vessel and a reversed saphenous vein was sewn  end-to-side with a running 7-0 Prolene.  There was good flow through this  graft.  Cardioplegia was redosed.  The fourth distal anastomosis was to the distal third of the LAD.  This was a 1.7-mm vessel with proximal  90%  stenosis.  The left IMA pedicle was brought through an opening created in  the left lateral pericardium and was brought down onto the LAD and sewn end-  to-side with a running 8-0 Prolene.  There was excellent flow through the  anastomosis after briefly releasing the pedicle bulldog on the mammary  pedicle.  The pedicle was secured to the epicardium and cardioplegia was  then redosed.   Attention was then directed to the mitral valve.  A left atriotomy was  performed and the mitral retractors were positioned to expose  the mitral  valve.  Exposure was felt to be adequate to good.  It was apparent there was  tethering of the P3 segment at the posterior commissure as the cause for the  leak.  2-0 Ethicon sutures were placed around the annulus, numbering 11  total sutures.  The anterior leaflet was sized to a 26-mm McCarthy-Adams  annuloplasty ring.  The sutures were placed through the angioplasty ring and  the ring was seated and sutures were tied.  The valve was again tested with  saline and there was no mitral regurgitation.  The atriotomy was closed in 2  layers using a running 4-0 Prolene.  Cardioplegia was redosed every 20  minutes during the period of crossclamp.   The 3 proximal anastomoses were then placed on the ascending aorta using a  4.0-mm punch with running 6-0 Prolene.  Prior to tying down the final  proximal anastomosis, air was vented from the coronaries and the left side  of heart using a dose of retrograde warm blood cardioplegia and the usual de-  airing maneuvers on bypass.  The final proximal anastomosis was tied and the  crossclamp was removed.   The heart resumed a spontaneous rhythm.  Air was aspirated from the vein  grafts with a 27-gauge needle and each graft was opened and had good flow.  The cardioplegia sutures were removed.  The proximal and distal coronary  anastomoses were checked and found to be hemostatic.  The left atrial  closure was found to be  hemostatic.  The patient was rewarmed to 37 degrees.  Temporary pacing wires were applied.  The lungs re-expanded and the  ventilator was resumed.  When the patient was adequately rewarmed and  reperfused, she was weaned from bypass on low-dose dopamine and milrinone.  Blood pressure and cardiac output were stable.  Protamine was administered  without adverse reaction.  The cannulas were removed.   The transesophageal echo showed no residual mitral regurgitation with  baseline LV dysfunction, although her hemodynamics were improved with a  cardiac output of approximately 4 L.  The mediastinum was irrigated with  warm antibiotic irrigation.  The leg incision was irrigated and closed in a  standard fashion.  The patient was transfused platelets and FFP for a  coagulopathy which persisted after administration of protamine.  The  superior pericardial fat was closed over the aorta and vein grafts.  Two mediastinal and a left pleural chest tube were placed and brought through  separate incisions.  The sternum was closed with interrupted steel wire.  The pectoralis fascia was closed with a running #1 Vicryl.  The subcutaneous  and skin layers were closed with a running Vicryl and sterile dressings were  applied.  Total bypass time was 210 minutes with crossclamp time of 140  minutes.      Kerin Perna, M.D.  Electronically Signed     PV/MEDQ  D:  04/06/2006  T:  04/08/2006  Job:  [id number]   cc:   [facility], M.D.

## 2010-12-04 NOTE — Discharge Summary (Signed)
NAMEKHUSHBU, [name]                ACCOUNT NO.:  [id number]   MEDICAL RECORD NO.:  [id number]          PATIENT TYPE:  INP   LOCATION:  7001                         FACILITY:  [facility]   PHYSICIAN:  Lyn Records, M.D.   DATE OF BIRTH:  11/23/1960   DATE OF ADMISSION:  04/06/2006  DATE OF DISCHARGE:  04/13/2006                               DISCHARGE SUMMARY   DISCHARGE DIAGNOSES:  1. Coronary artery disease, patient is being discharged to home on      leave of absence to return for bypass surgery next week.  2. Mitral regurgitation, patient for mitral valve surgery at the same      time as bypass.  3. Chronic periodontitis, status post multiple dental extractions by      Dr. Kristin Bruins.  4. Congestive heart failure, resolved.  5. Ischemic cardiomyopathy, EF 30% to 40%.  6. Hypertension, treated.  7. Dyslipidemia.  8. Tobacco abuse history.  9. ACE inhibitor causes cough.   HOSPITAL COURSE:  Ms. [name] is a 50 year old female who is admitted to  [facility] on March 25, 2006 with substernal chest pain.  She has  known coronary artery disease and has had multiple stents implanted.  CK-  MBs were normal with the exception of troponins, which were elevated  minimally to 0.0 to 0.9.  In addition the patient's BNP was elevated at  1652 and she was digressively diuresed.   Initially the patient underwent a left and a right heart catheterization  when she had a 3+ MR, EF around 30% to 40%.  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  In addition, the previous stent sites showed  first diagonal 50% stenosis and circumflex was occluded in the mid  vessel and the first OM 70% occluded; RCA had a 50% proximal and a 60%  mid lesion.   With the above findings, the patient was seen in consultation by Dr.  Kathlee Nations Trigt prior to coronary artery bypass grafting and mitral  valve angioplasty.  The patient required multiple dental extractions  under the care of Dr. Kristin Bruins.   Patient tolerated these procedures well  and was discharged on March 31, 2006 to home with the intent of her  returning next week for coronary artery bypass grafting and mitral valve  angioplasty.  The patient was discharged to home on Lovenox until the  surgery could be performed.   MEDICATIONS:  Include Coreg 6.25 mg p.o. b.i.d., potassium 20 mEq,  enteric-coated aspirin 325 mg a day, Avapro 300 mg daily, Lasix 40 mg a  day, Imdur 60 mg a day, sublingual nitroglycerin p.r.n. chest pain,  Lipitor 40 mg a day.   Patient is to call for any recurrent chest pain.  The patient to return  on Wednesday, April 06, 2006, as short stay, section A, for coronary  artery bypass grafting and mitral valve angioplasty by Dr. Donata Clay.   Part of the patient's workup included carotid artery evaluation.  There  was no RCA stenosis bilaterally and the patient's palpable lower  extremity pulses and did not require  lower extremity ABI.      Guy Franco, P.A.      Lyn Records, M.D.  Electronically Signed    LB/MEDQ  D:  06/08/2006  T:  06/09/2006  Job:  [id number]   cc:   Kerin Perna, M.D.  Lyn Records, M.D.

## 2010-12-04 NOTE — Op Note (Signed)
NAMEMILEA, KLINK                ACCOUNT NO.:  [id number]   MEDICAL RECORD NO.:  [id number]          PATIENT TYPE:  INP   LOCATION:  2316                         FACILITY:  [facility]   PHYSICIAN:  Bedelia Person, M.D.        DATE OF BIRTH:  Sep 10, 1960   DATE OF PROCEDURE:  04/06/2006  DATE OF DISCHARGE:                                 OPERATIVE REPORT   ANESTHESIOLOGIST:  Bedelia Person, M.D.   PROCEDURE:  Coronary artery bypass grafting with mitral valve repair or  replacement; the transesophageal echocardiography will be used  intraoperatively to assess the extent of the mitral valve damage, and  whether repair or replacement is necessary; the patient has no history of  esophageal or gastric abnormalities.   DESCRIPTION OF PROCEDURE:  After induction with general anesthesia, the  airway was secured with an oroendotracheal tube, and the gastric contents  were suctioned with a temporary orogastric tube, which was then removed.  The transesophageal probe was lubricated and placed in a sleeve, which was  then heavily lubricated and then placed blindly down the oropharynx to the  40-cm mark, where it remained throughout the remainder of the case.  Multiplane views were obtained throughout the case.  At the completion of  the procedure, the probe was removed.  There was no evidence of oral or  pharyngeal damage.  Prebypass examination revealed the left ventricle to be  slightly enlarged.  The appendix was clean. The septum was intact.  The left  ventricle was overall hypokinetic in nature, estimated ejection fraction of  35 to 40%.  There was akinesis noted of the anteroseptal wall, as well as  the anterior septal wall and hypokinesis of the posterior wall.  There were  no aneurysmal dilatations noted.  The mitral valve was normal in appearance,  with thin, freely mobile leaflets.  There was no calcification noted.  Color  Doppler reveals a 3 to 4+ central regurgitant jet.  Depending upon the  patient's blood pressure, there could be evidence of reversal of flow in  both pulmonaries, both the right and the left pulmonary veins.  The aortic  valve had 3 leaflets.  No calcifications were noted.  They were all opening  and closing appropriately.  No aortic insufficiency was noted.  The patient  was placed on cardiopulmonary bypass and underwent coronary artery bypass  grafting, as well as Carpentier-Edwards ring placement in the mitral valve  position.  Post bypass, there were a few air bubbles, which were coming from  the right pulmonary vein.  These quickly cleared with left ventricular vent.  Post-bypass examination revealed the left ventricle to show overall improved  contractility on inotropic dopamine and milrinone vasal dilatation.  There  was a continued akinesis of the anteroseptal and inferior septal walls.  The  posterior wall hypokinesis had improved marginally.  The mitral valve ring  appeared to be well seated.  Leaflets continued to open and close  appropriately.  Color Doppler revealed no mitral regurgitation.  The aortic  valve was unchanged in appearance, continued to have 3 freely mobile  leaflets, and no aortic insufficiency was detected.           ______________________________  Bedelia Person, M.D.     LK/MEDQ  D:  04/06/2006  T:  04/06/2006  Job:  [id number]

## 2010-12-04 NOTE — Consult Note (Signed)
NAMELUCEE, BRISSETT NO.:  [id number]   MEDICAL RECORD NO.:  [id number]          PATIENT TYPE:  INP   LOCATION:  3307                         FACILITY:  [facility]   PHYSICIAN:  Kerin Perna, M.D.  DATE OF BIRTH:  1960-08-14   DATE OF CONSULTATION:  03/28/2006  DATE OF DISCHARGE:                                   CONSULTATION   REFERRING PHYSICIAN:  Lyn Records, M.D.   PRIMARY CARE PHYSICIAN:  Donia Guiles, M.D.   REASON FOR CONSULTATION:  Ischemic mitral regurgitation with class IV CHF.   CHIEF COMPLAINT:  Shortness of breath and chest tightness.   HISTORY OF PRESENT ILLNESS:  I was asked to evaluate this 50 year old  African-American female for possible surgical coronary revascularization and  mitral valve repair for recently diagnosed severe mitral regurgitation with  class IV symptoms of congestive heart failure.  The patient has a history of  coronary artery disease and underwent an LAD stent placement with a drug  eluting stent by Dr. Katrinka Blazing in 2003, at which time she presented with an  apical MI.  She did well until approximately 2 weeks ago when she developed  shortness of breath and chest pain.  She developed orthopnea and PND as well  as dyspnea on exertion and ankle edema.  She has had intermittent left chest  tightness associated with some nausea and vomiting.  The patient has been  unable to comply with her medical treatment of her hypertension and  hyperlipidemia and known coronary artery disease due to financial  difficulties from losing her job.  She presented to her family physician on  Friday and was told to report to the emergency department at [facility]  where she was admitted.  Her chest x-ray showed pulmonary edema and her  troponin was 0.1.  She was in a sinus rhythm with nonspecific ST segment  changes.  An echocardiogram performed on that date demonstrated reduced LV  function with severe mitral regurgitation.  She was placed on  Lasix and ARB  and beta blocker therapy with significant diuresis and improvement of her  symptoms.  Her cardiac enzymes were never significantly elevated.   The patient underwent diagnostic left and right heart catheterization today  by Dr. Katrinka Blazing.  This demonstrated a patent stent to the LAD with a 50-60%  stenosis.  A circumflex was completely occluded at the proximal segment  without good collateralization.  The dominant right coronary had a 60-70%  mid distal stenosis.  Ejection fraction was 30% and there was severe mitral  regurgitation.  Right heart catheterization data included PA pressures of  63/32, wedge pressure of 30, cardiac output of 2.4 liters per minute, mixed  venous saturation of 49%.  The patient had undergone a CT scan of the chest  on admission which showed no pulmonary emboli and air space disease at the  right lower lung base.  Because of the patient's severe mitral regurgitation  associated with LV dysfunction and coronary artery disease, a cardiothoracic  surgical evaluation was requested.   PAST MEDICAL HISTORY:  1. Status post drug  eluting stent to the LAD in 2003.  2. Hypertension.  3. Hyperlipidemia.  4. History of tobacco abuse stopped in 2004.  5. Medical noncompliance due to financial issues.   PAST SURGICAL HISTORY:  Positive for hysterectomy.   HOME MEDICATIONS:  None.   ALLERGIES:  ACE INHIBITOR causes cough.   SOCIAL HISTORY:  The patient is married and previously worked for an  Scientist, forensic.  She stopped smoking 2 years ago and does not use  alcohol.  She has a 36 year old son.   FAMILY HISTORY:  Positive for diabetes, hypertension, and stroke.  Negative  for coronary artery disease.   REVIEW OF SYSTEMS:  CONSTITUTIONAL:  Negative for fever or weight loss.  ENT:  Significant for no dental exams for the past several years, but no  dental complaints.  She denies difficulty swallowing.  THORACIC:  Negative  for history of chest trauma or  abnormal chest x-ray with pulmonary nodule.  Positive for her shortness of breath on admission.  CARDIAC:  Positive for a  prior known history of coronary artery disease and now ischemic mitral  regurgitation.  She has maintained sinus rhythm.  She has a new onset mitral  murmur at this presentation.  GI:  Negative for hepatitis, jaundice,  gallstones, or blood per rectum.  GU:  Negative for UTI or kidney stones.  ENDOCRINE:  Negative for diabetes or thyroid disease.  VASCULAR:  Negative  for DVT, claudication, or TIA.  HEMATOLOGIC:  Negative for bleeding  disorder, blood transfusion.  NEUROLOGY:  Negative for stroke or seizure.  MUSCULOSKELETAL:  Negative for significant trauma or fracture.   PHYSICAL EXAMINATION:  VITAL SIGNS:  The patient is 5 feet 4 inches and  weighs 170 pounds after diuresis.  Blood pressure 140/90, pulse 80 and  sinus, respirations 20.  GENERAL:  Small, middle-aged, black female in the catheterization lab  holding are, anxious, but in no acute distress.  She is breathing  comfortably while supine.  She has a compression dressing in the right  groin.  HEENT:  Normocephalic.  She has poor dental hygiene with several necrotic  teeth in the mandible and maxilla.  NECK:  Without JVD, mass, or carotid bruit.  LYMPHATICS:  Revealed no palpable adenopathy.  LUNGS:  Breath sounds are with basilar rales right greater than left.  HEART:  Grade 2-3/6 holosystolic murmur without S3 gallop.  ABDOMEN:  Soft without pulsatile mass or focal tenderness.  EXTREMITIES:  No cyanosis, clubbing, or edema.  Peripheral pulses are 2+ in  the upper extremities, 1+ in the pedal region.  SKIN:  Without rash or lesion.  NEUROLOGY:  Alert and oriented without focal motor deficit.  She is limited  to bed rest at this time following cardiac catheterization.   LABORATORY DATA:  I reviewed the coronary arteriograms and two-dimensional echocardiogram from Dr. Katrinka Blazing as well as the chest x-rays and  CT scan of the  chest.  The patient has severe ischemic mitral regurgitation with LV  dysfunction and three-vessel coronary artery disease.   PLAN:  The patient will need preoperative dental evaluation and probable  dental extraction.  After her dental issues are resolved, she will be  scheduled for CABG with mitral valve repair.   Thank you for the consultation.      Kerin Perna, M.D.  Electronically Signed     PV/MEDQ  D:  03/28/2006  T:  03/28/2006  Job:  [id number]

## 2010-12-15 ENCOUNTER — Ambulatory Visit
Admission: RE | Admit: 2010-12-15 | Discharge: 2010-12-15 | Disposition: A | Discharge status: Home / Self Care | Payer: 59 | Source: Ambulatory Visit | Attending: Obstetrics and Gynecology | Admitting: Obstetrics and Gynecology

## 2010-12-15 DIAGNOSIS — Z1231 Encounter for screening mammogram for malignant neoplasm of breast: Secondary | ICD-10-CM

## 2010-12-17 ENCOUNTER — Other Ambulatory Visit: Payer: Self-pay | Admitting: Obstetrics and Gynecology

## 2010-12-17 DIAGNOSIS — R928 Other abnormal and inconclusive findings on diagnostic imaging of breast: Secondary | ICD-10-CM

## 2010-12-25 ENCOUNTER — Ambulatory Visit
Admission: RE | Admit: 2010-12-25 | Discharge: 2010-12-25 | Disposition: A | Discharge status: Home / Self Care | Payer: 59 | Source: Ambulatory Visit | Attending: Obstetrics and Gynecology | Admitting: Obstetrics and Gynecology

## 2010-12-25 DIAGNOSIS — R928 Other abnormal and inconclusive findings on diagnostic imaging of breast: Secondary | ICD-10-CM

## 2012-12-14 ENCOUNTER — Other Ambulatory Visit: Payer: Self-pay

## 2012-12-14 DIAGNOSIS — Z1231 Encounter for screening mammogram for malignant neoplasm of breast: Secondary | ICD-10-CM

## 2012-12-15 ENCOUNTER — Ambulatory Visit: Payer: 59

## 2013-05-28 ENCOUNTER — Encounter: Payer: Self-pay | Admitting: Interventional Cardiology

## 2013-08-11 ENCOUNTER — Encounter: Payer: Self-pay | Admitting: *Deleted

## 2013-08-11 ENCOUNTER — Encounter: Payer: Self-pay | Admitting: Interventional Cardiology

## 2013-08-11 DIAGNOSIS — E669 Obesity, unspecified: Secondary | ICD-10-CM | POA: Insufficient documentation

## 2013-08-11 DIAGNOSIS — I4891 Unspecified atrial fibrillation: Secondary | ICD-10-CM | POA: Insufficient documentation

## 2013-08-11 DIAGNOSIS — I10 Essential (primary) hypertension: Secondary | ICD-10-CM | POA: Insufficient documentation

## 2013-08-11 DIAGNOSIS — I5042 Chronic combined systolic (congestive) and diastolic (congestive) heart failure: Secondary | ICD-10-CM | POA: Insufficient documentation

## 2013-08-11 DIAGNOSIS — I25709 Atherosclerosis of coronary artery bypass graft(s), unspecified, with unspecified angina pectoris: Secondary | ICD-10-CM | POA: Insufficient documentation

## 2013-08-11 DIAGNOSIS — E7800 Pure hypercholesterolemia, unspecified: Secondary | ICD-10-CM | POA: Insufficient documentation

## 2013-08-15 ENCOUNTER — Ambulatory Visit: Payer: 59 | Admitting: Interventional Cardiology

## 2013-10-02 ENCOUNTER — Ambulatory Visit: Payer: 59 | Admitting: Interventional Cardiology

## 2014-01-15 ENCOUNTER — Other Ambulatory Visit: Payer: Self-pay

## 2014-01-15 DIAGNOSIS — Z1231 Encounter for screening mammogram for malignant neoplasm of breast: Secondary | ICD-10-CM

## 2014-01-22 ENCOUNTER — Encounter (INDEPENDENT_AMBULATORY_CARE_PROVIDER_SITE_OTHER): Payer: Self-pay

## 2014-01-22 ENCOUNTER — Ambulatory Visit
Admission: RE | Admit: 2014-01-22 | Discharge: 2014-01-22 | Disposition: A | Discharge status: Home / Self Care | Payer: 59 | Source: Ambulatory Visit

## 2014-01-22 ENCOUNTER — Telehealth: Payer: Self-pay | Admitting: *Deleted

## 2014-01-22 DIAGNOSIS — Z1231 Encounter for screening mammogram for malignant neoplasm of breast: Secondary | ICD-10-CM

## 2014-01-22 NOTE — Telephone Encounter (Signed)
[facility] [address] requests furosemide refill for patient. Thanks, MI

## 2014-01-23 MED ORDER — FUROSEMIDE 40 MG PO TABS: 40.0000 mg | ORAL_TABLET | Freq: Every day | ORAL | Status: DC

## 2014-01-23 NOTE — Telephone Encounter (Signed)
Furosemide refilled pt needs to schedule an appt for add refills

## 2014-01-29 ENCOUNTER — Other Ambulatory Visit: Payer: Self-pay

## 2014-01-29 MED ORDER — LISINOPRIL 40 MG PO TABS: 40.0000 mg | ORAL_TABLET | Freq: Every day | ORAL | Status: DC

## 2014-02-07 ENCOUNTER — Telehealth: Payer: Self-pay

## 2014-02-07 MED ORDER — LISINOPRIL 40 MG PO TABS: 40.0000 mg | ORAL_TABLET | Freq: Every day | ORAL | Status: DC

## 2014-02-07 MED ORDER — FUROSEMIDE 40 MG PO TABS: 40.0000 mg | ORAL_TABLET | Freq: Every day | ORAL | Status: DC

## 2014-02-07 NOTE — Telephone Encounter (Signed)
pt past due for f/u with Dr.Smith appt sh for 9/24 @8am . furosemide and lisinopril refilled [facility]. pt aware of appt an adv she needs to keep appt to receiveing add refills

## 2014-02-07 NOTE — Telephone Encounter (Signed)
Candice StanleyLisa, pt sees Dr. Katrinka BlazingSmith. Can we refill? She is overdue for 8 month follow up appt.

## 2014-03-29 ENCOUNTER — Encounter: Payer: Self-pay | Admitting: *Deleted

## 2014-04-02 ENCOUNTER — Other Ambulatory Visit: Payer: Self-pay | Admitting: Interventional Cardiology

## 2014-04-11 ENCOUNTER — Ambulatory Visit: Payer: 59 | Admitting: Interventional Cardiology

## 2014-04-26 ENCOUNTER — Other Ambulatory Visit: Payer: Self-pay | Admitting: *Deleted

## 2014-04-26 MED ORDER — CARVEDILOL 12.5 MG PO TABS: 12.5000 mg | ORAL_TABLET | Freq: Two times a day (BID) | ORAL | Status: DC

## 2014-06-04 ENCOUNTER — Other Ambulatory Visit: Payer: Self-pay | Admitting: Interventional Cardiology

## 2014-07-03 ENCOUNTER — Ambulatory Visit: Payer: 59 | Admitting: Interventional Cardiology

## 2014-08-29 ENCOUNTER — Ambulatory Visit: Payer: 59 | Admitting: Interventional Cardiology

## 2014-09-11 ENCOUNTER — Other Ambulatory Visit: Payer: Self-pay | Admitting: Interventional Cardiology

## 2014-09-11 ENCOUNTER — Other Ambulatory Visit: Payer: Self-pay | Admitting: *Deleted

## 2014-09-11 MED ORDER — CARVEDILOL 12.5 MG PO TABS: 12.5000 mg | ORAL_TABLET | Freq: Two times a day (BID) | ORAL | Status: DC

## 2014-10-31 ENCOUNTER — Ambulatory Visit: Payer: Self-pay | Admitting: Interventional Cardiology

## 2014-11-06 ENCOUNTER — Telehealth: Payer: Self-pay

## 2014-11-07 ENCOUNTER — Other Ambulatory Visit: Payer: Self-pay

## 2014-11-07 NOTE — Telephone Encounter (Signed)
No further refills should be granted

## 2014-12-17 ENCOUNTER — Other Ambulatory Visit: Payer: Self-pay | Admitting: Interventional Cardiology

## 2015-01-01 ENCOUNTER — Other Ambulatory Visit: Payer: Self-pay

## 2015-01-01 DIAGNOSIS — Z1231 Encounter for screening mammogram for malignant neoplasm of breast: Secondary | ICD-10-CM

## 2015-01-06 ENCOUNTER — Ambulatory Visit: Payer: Self-pay | Admitting: Physician Assistant

## 2015-01-07 ENCOUNTER — Ambulatory Visit: Payer: Self-pay | Admitting: Physician Assistant

## 2015-01-07 ENCOUNTER — Ambulatory Visit: Payer: Self-pay

## 2015-01-21 ENCOUNTER — Other Ambulatory Visit: Payer: Self-pay | Admitting: Interventional Cardiology

## 2015-01-23 ENCOUNTER — Other Ambulatory Visit: Payer: Self-pay | Admitting: *Deleted

## 2015-01-23 ENCOUNTER — Telehealth: Payer: Self-pay

## 2015-01-23 MED ORDER — CARVEDILOL 12.5 MG PO TABS: 12.5000 mg | ORAL_TABLET | Freq: Two times a day (BID) | ORAL | Status: DC

## 2015-01-23 NOTE — Telephone Encounter (Signed)
I don't see where I've ever seen this patient.  Has seen Hank Smith in past.

## 2015-01-23 NOTE — Telephone Encounter (Signed)
Pt. requesting Lasix refill, however has not ever been seen in this office. She has scheduled appts., but cancelled.

## 2015-01-23 NOTE — Telephone Encounter (Signed)
Okay to refill but needs OV with me in next 4-6 weeks with a BMET.

## 2015-01-28 NOTE — Telephone Encounter (Signed)
Needs furosemide refilled. Give enough for 1 month. She needs to see me in that time frame to re-establish.

## 2015-01-31 ENCOUNTER — Other Ambulatory Visit: Payer: Self-pay

## 2015-01-31 MED ORDER — FUROSEMIDE 40 MG PO TABS: ORAL_TABLET | ORAL | Status: DC

## 2015-03-05 ENCOUNTER — Ambulatory Visit: Payer: Self-pay | Admitting: Physician Assistant

## 2015-03-11 ENCOUNTER — Other Ambulatory Visit: Payer: Self-pay | Admitting: Interventional Cardiology

## 2015-03-11 NOTE — Telephone Encounter (Signed)
Ok to refuse.

## 2015-03-11 NOTE — Telephone Encounter (Signed)
Just wanted to check with you before this is refused. Patient has once again cancelled her appt and re-scheduled it for next month.

## 2015-03-13 ENCOUNTER — Other Ambulatory Visit: Payer: Self-pay | Admitting: Interventional Cardiology

## 2015-03-24 ENCOUNTER — Other Ambulatory Visit: Payer: Self-pay | Admitting: Interventional Cardiology

## 2015-04-03 ENCOUNTER — Telehealth: Payer: Self-pay | Admitting: Interventional Cardiology

## 2015-04-03 ENCOUNTER — Encounter: Payer: Self-pay | Admitting: Interventional Cardiology

## 2015-04-03 NOTE — Telephone Encounter (Signed)
Called pt and left message informing her that we needed more information concerning her family history and if she could give our office a call back to update her information.

## 2015-04-07 ENCOUNTER — Ambulatory Visit: Payer: Self-pay | Admitting: Physician Assistant

## 2015-05-06 ENCOUNTER — Other Ambulatory Visit: Payer: Self-pay | Admitting: Interventional Cardiology

## 2015-05-21 ENCOUNTER — Ambulatory Visit: Payer: Self-pay | Admitting: Physician Assistant

## 2015-06-09 ENCOUNTER — Other Ambulatory Visit: Payer: Self-pay | Admitting: Interventional Cardiology

## 2015-06-17 ENCOUNTER — Other Ambulatory Visit: Payer: Self-pay | Admitting: Interventional Cardiology

## 2015-07-07 ENCOUNTER — Telehealth: Payer: Self-pay | Admitting: Interventional Cardiology

## 2015-07-07 NOTE — Telephone Encounter (Signed)
Called pt and left message asking pt to call back to get fm hx updated and if she could not call back that we would get information on her appt date on 07/16/15.

## 2015-07-16 ENCOUNTER — Other Ambulatory Visit: Payer: Self-pay | Admitting: Interventional Cardiology

## 2015-07-16 ENCOUNTER — Ambulatory Visit: Payer: 59 | Admitting: Physician Assistant

## 2015-08-05 ENCOUNTER — Telehealth: Payer: Self-pay | Admitting: Interventional Cardiology

## 2015-08-05 ENCOUNTER — Encounter: Payer: Self-pay | Admitting: Interventional Cardiology

## 2015-08-05 NOTE — Telephone Encounter (Signed)
Called pt and left message for pt to call back to update her Fm and medical information.

## 2015-08-14 NOTE — Progress Notes (Signed)
Cardiology Office Note:    Date:  08/14/2015   ID:  [name], DOB December 24, 1960, MRN [id number]  PCP:  No primary care provider on file.  Cardiologist:  Dr. Verdis Prime   Electrophysiologist:  n/a  Chief Complaint  Patient presents with  . Coronary Artery Disease    Follow-up    History of Present Illness:    [name] is a 55 y.o. female with a hx of     Past Medical History  Diagnosis Date  . Hypercholesteremia   . Atrial fibrillation (HCC)   . HF (heart failure) (HCC)     with LVEF 40% 2008, Echo, functional class 2.  . Obesity   . Coronary atherosclerosis of native coronary artery   . HTN (hypertension)   . Hx of CABG     Past Surgical History  Procedure Laterality Date  . Coronary artery bypass graft      Current Medications: Outpatient Prescriptions Prior to Visit  Medication Sig Dispense Refill  . amLODipine (NORVASC) 5 MG tablet Take 5 mg by mouth daily.    [name] aspirin 81 MG tablet Take 81 mg by mouth daily.    . carvedilol (COREG) 12.5 MG tablet TAKE 1 TABLET (12.5 MG TOTAL) BY MOUTH 2 (TWO) TIMES DAILY WITH A MEAL. 60 tablet 0  . furosemide (LASIX) 40 MG tablet TAKE 1 TABLET (40 MG TOTAL) BY MOUTH DAILY. 30 tablet 2  . furosemide (LASIX) 40 MG tablet TAKE 1 TABLET BY MOUTH EVERY DAY PLEASE SCHEDULE APPT [id number] 30 tablet 0  . lisinopril (PRINIVIL,ZESTRIL) 40 MG tablet TAKE 1 TABLET (40 MG TOTAL) BY MOUTH DAILY. 30 tablet 1  . nitroGLYCERIN (NITROSTAT) 0.4 MG SL tablet Place 0.4 mg under the tongue every 5 (five) minutes as needed for chest pain.    . potassium chloride (K-DUR) 10 MEQ tablet Take 10 mEq by mouth daily.    . pravastatin (PRAVACHOL) 40 MG tablet Take 40 mg by mouth daily.     No facility-administered medications prior to visit.     Allergies:   Prinivil   Social History   Social History  . Marital Status: Married    Spouse Name: N/A  . Number of Children: N/A  . Years of Education: N/A   Social History Main Topics  .  Smoking status: Former Games developer  . Smokeless tobacco: Not on file  . Alcohol Use: 0.0 oz/week    0 Standard drinks or equivalent per week  . Drug Use: No  . Sexual Activity: Not on file   Other Topics Concern  . Not on file   Social History Narrative     Family History:  The patient's Family history is unknown by patient.   ROS:   Please see the history of present illness.    ROS All other systems reviewed and are negative.   Physical Exam:    VS:  There were no vitals taken for this visit.   GEN: Well nourished, well developed, in no acute distress HEENT: normal Neck: no JVD, no masses Cardiac: Normal S1/S2, RRR; no murmurs, rubs, or gallops, no edema;   carotid bruits,   Respiratory:  clear to auscultation bilaterally; no wheezing, rhonchi or rales GI: soft, nontender, nondistended, + BS MS: no deformity or atrophy Skin: warm and dry, no rash Neuro:  Bilateral strength equal, no focal deficits  Psych: Alert and oriented x 3, normal affect  Wt Readings from Last 3 Encounters:  No data found for  Wt      Studies/Labs Reviewed:    EKG:  EKG is  ordered today.  The ekg ordered today demonstrates   Recent Labs: No results found for requested labs within last 365 days.   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  PLAN:    In order of problems listed above:  1.     Medication Adjustments/Labs and Tests Ordered: Current medicines are reviewed at length with the  patient today.  Concerns regarding medicines are outlined above.  Medication changes, Labs and Tests ordered today are outlined in the Patient Instructions noted below. There are no Patient Instructions on file for this visit.   Signed, Tereso Newcomer, PA-C  08/14/2015 5:44 PM    [facility] [address] [place], [place], [place]  [address] Phone: [phone]; Fax: [phone]     This encounter was created in error - please disregard.

## 2015-08-15 ENCOUNTER — Encounter: Payer: 59 | Admitting: Physician Assistant

## 2015-08-20 ENCOUNTER — Encounter: Payer: Self-pay | Admitting: Interventional Cardiology

## 2015-09-10 ENCOUNTER — Other Ambulatory Visit: Payer: Self-pay | Admitting: Interventional Cardiology

## 2015-09-10 ENCOUNTER — Other Ambulatory Visit: Payer: Self-pay

## 2015-09-10 MED ORDER — LISINOPRIL 40 MG PO TABS: ORAL_TABLET | ORAL | Status: DC

## 2015-09-10 NOTE — Telephone Encounter (Signed)
Sent refill in for lisinopril in error. I discovered that she was not to have any refills granted from past telephone encounter. Patient chart says she is allergic to lisinopril. Patient has an upcoming appt to see Dr. Katrinka Blazing in June but has cancelled many times and not showed up to appts. [facility] and [facility] her [facility] on lisinopril.

## 2015-09-25 ENCOUNTER — Ambulatory Visit: Payer: 59 | Admitting: Physician Assistant

## 2015-10-13 ENCOUNTER — Emergency Department (HOSPITAL_COMMUNITY): Payer: 59

## 2015-10-13 ENCOUNTER — Encounter (HOSPITAL_COMMUNITY): Payer: Self-pay | Admitting: Emergency Medicine

## 2015-10-13 ENCOUNTER — Other Ambulatory Visit: Payer: Self-pay | Admitting: Interventional Cardiology

## 2015-10-13 ENCOUNTER — Inpatient Hospital Stay (HOSPITAL_COMMUNITY)
Admission: EM | Admit: 2015-10-13 | Discharge: 2015-10-15 | DRG: 247 | Disposition: A | Discharge status: Home / Self Care | Payer: 59 | Attending: Interventional Cardiology | Admitting: Interventional Cardiology

## 2015-10-13 DIAGNOSIS — I249 Acute ischemic heart disease, unspecified: Secondary | ICD-10-CM | POA: Diagnosis present

## 2015-10-13 DIAGNOSIS — I10 Essential (primary) hypertension: Secondary | ICD-10-CM | POA: Diagnosis not present

## 2015-10-13 DIAGNOSIS — Z7982 Long term (current) use of aspirin: Secondary | ICD-10-CM

## 2015-10-13 DIAGNOSIS — I214 Non-ST elevation (NSTEMI) myocardial infarction: Principal | ICD-10-CM

## 2015-10-13 DIAGNOSIS — I2510 Atherosclerotic heart disease of native coronary artery without angina pectoris: Secondary | ICD-10-CM | POA: Diagnosis present

## 2015-10-13 DIAGNOSIS — I25810 Atherosclerosis of coronary artery bypass graft(s) without angina pectoris: Secondary | ICD-10-CM | POA: Diagnosis present

## 2015-10-13 DIAGNOSIS — E669 Obesity, unspecified: Secondary | ICD-10-CM | POA: Diagnosis present

## 2015-10-13 DIAGNOSIS — I480 Paroxysmal atrial fibrillation: Secondary | ICD-10-CM | POA: Diagnosis not present

## 2015-10-13 DIAGNOSIS — Z6831 Body mass index (BMI) 31.0-31.9, adult: Secondary | ICD-10-CM | POA: Diagnosis not present

## 2015-10-13 DIAGNOSIS — Z79899 Other long term (current) drug therapy: Secondary | ICD-10-CM

## 2015-10-13 DIAGNOSIS — E7800 Pure hypercholesterolemia, unspecified: Secondary | ICD-10-CM | POA: Diagnosis present

## 2015-10-13 DIAGNOSIS — I4891 Unspecified atrial fibrillation: Secondary | ICD-10-CM | POA: Diagnosis present

## 2015-10-13 DIAGNOSIS — Z888 Allergy status to other drugs, medicaments and biological substances status: Secondary | ICD-10-CM | POA: Diagnosis not present

## 2015-10-13 DIAGNOSIS — E785 Hyperlipidemia, unspecified: Secondary | ICD-10-CM | POA: Diagnosis present

## 2015-10-13 DIAGNOSIS — Z87891 Personal history of nicotine dependence: Secondary | ICD-10-CM

## 2015-10-13 DIAGNOSIS — Z23 Encounter for immunization: Secondary | ICD-10-CM

## 2015-10-13 DIAGNOSIS — I25110 Atherosclerotic heart disease of native coronary artery with unstable angina pectoris: Secondary | ICD-10-CM | POA: Diagnosis not present

## 2015-10-13 DIAGNOSIS — I25709 Atherosclerosis of coronary artery bypass graft(s), unspecified, with unspecified angina pectoris: Secondary | ICD-10-CM | POA: Diagnosis present

## 2015-10-13 DIAGNOSIS — T82897A Other specified complication of cardiac prosthetic devices, implants and grafts, initial encounter: Secondary | ICD-10-CM | POA: Diagnosis present

## 2015-10-13 DIAGNOSIS — I110 Hypertensive heart disease with heart failure: Secondary | ICD-10-CM | POA: Diagnosis present

## 2015-10-13 DIAGNOSIS — Z9119 Patient's noncompliance with other medical treatment and regimen: Secondary | ICD-10-CM

## 2015-10-13 DIAGNOSIS — I5042 Chronic combined systolic (congestive) and diastolic (congestive) heart failure: Secondary | ICD-10-CM | POA: Diagnosis present

## 2015-10-13 DIAGNOSIS — Z955 Presence of coronary angioplasty implant and graft: Secondary | ICD-10-CM

## 2015-10-13 DIAGNOSIS — I25710 Atherosclerosis of autologous vein coronary artery bypass graft(s) with unstable angina pectoris: Secondary | ICD-10-CM | POA: Diagnosis not present

## 2015-10-13 HISTORY — DX: Heart failure, unspecified: I50.9

## 2015-10-13 HISTORY — DX: Non-ST elevation (NSTEMI) myocardial infarction: I21.4

## 2015-10-13 HISTORY — DX: Acute myocardial infarction, unspecified: I21.9

## 2015-10-13 LAB — COMPREHENSIVE METABOLIC PANEL
ALT: 15 U/L (ref 14–54)
ANION GAP: 7 (ref 5–15)
AST: 17 U/L (ref 15–41)
Albumin: 3.8 g/dL (ref 3.5–5.0)
Alkaline Phosphatase: 54 U/L (ref 38–126)
BUN: 10 mg/dL (ref 6–20)
CALCIUM: 9.3 mg/dL (ref 8.9–10.3)
CHLORIDE: 109 mmol/L (ref 101–111)
CO2: 27 mmol/L (ref 22–32)
CREATININE: 1.05 mg/dL — AB (ref 0.44–1.00)
GFR, EST NON AFRICAN AMERICAN: 58 mL/min — AB (ref >60)
Glucose, Bld: 99 mg/dL (ref 65–99)
Potassium: 4.4 mmol/L (ref 3.5–5.1)
SODIUM: 143 mmol/L (ref 135–145)
Total Bilirubin: 0.5 mg/dL (ref 0.3–1.2)
Total Protein: 6.8 g/dL (ref 6.5–8.1)

## 2015-10-13 LAB — CBC
HCT: 39.9 % (ref 36.0–46.0)
Hemoglobin: 13.1 g/dL (ref 12.0–15.0)
MCH: 30.8 pg (ref 26.0–34.0)
MCHC: 32.8 g/dL (ref 30.0–36.0)
MCV: 93.9 fL (ref 78.0–100.0)
Platelets: 205 10*3/uL (ref 150–400)
RBC: 4.25 MIL/uL (ref 3.87–5.11)
RDW: 14.2 % (ref 11.5–15.5)
WBC: 4.6 10*3/uL (ref 4.0–10.5)

## 2015-10-13 LAB — BASIC METABOLIC PANEL
Anion gap: 7 (ref 5–15)
BUN: 11 mg/dL (ref 6–20)
CO2: 25 mmol/L (ref 22–32)
Calcium: 8.6 mg/dL — ABNORMAL LOW (ref 8.9–10.3)
Chloride: 111 mmol/L (ref 101–111)
Creatinine, Ser: 1.03 mg/dL — ABNORMAL HIGH (ref 0.44–1.00)
GFR calc Af Amer: >60 mL/min (ref >60)
GFR calc non Af Amer: 60 mL/min — ABNORMAL LOW (ref >60)
Glucose, Bld: 106 mg/dL — ABNORMAL HIGH (ref 65–99)
Potassium: 5.1 mmol/L (ref 3.5–5.1)
Sodium: 143 mmol/L (ref 135–145)

## 2015-10-13 LAB — CBC WITH DIFFERENTIAL/PLATELET
BASOS ABS: 0 10*3/uL (ref 0.0–0.1)
Basophils Relative: 1 %
EOS PCT: 3 %
Eosinophils Absolute: 0.2 10*3/uL (ref 0.0–0.7)
HCT: 41.1 % (ref 36.0–46.0)
Hemoglobin: 13.4 g/dL (ref 12.0–15.0)
LYMPHS PCT: 39 %
Lymphs Abs: 2.2 10*3/uL (ref 0.7–4.0)
MCH: 30.6 pg (ref 26.0–34.0)
MCHC: 32.6 g/dL (ref 30.0–36.0)
MCV: 93.8 fL (ref 78.0–100.0)
MONO ABS: 0.4 10*3/uL (ref 0.1–1.0)
Monocytes Relative: 7 %
Neutro Abs: 3 10*3/uL (ref 1.7–7.7)
Neutrophils Relative %: 52 %
PLATELETS: 207 10*3/uL (ref 150–400)
RBC: 4.38 MIL/uL (ref 3.87–5.11)
RDW: 14 % (ref 11.5–15.5)
WBC: 5.7 10*3/uL (ref 4.0–10.5)

## 2015-10-13 LAB — PROTIME-INR
INR: 1.18 (ref 0.00–1.49)
PROTHROMBIN TIME: 15.2 s (ref 11.6–15.2)

## 2015-10-13 LAB — BRAIN NATRIURETIC PEPTIDE: B NATRIURETIC PEPTIDE 5: 38.8 pg/mL (ref 0.0–100.0)

## 2015-10-13 LAB — I-STAT TROPONIN, ED: Troponin i, poc: 0.12 ng/mL (ref 0.00–0.08)

## 2015-10-13 LAB — MAGNESIUM: MAGNESIUM: 2.2 mg/dL (ref 1.7–2.4)

## 2015-10-13 LAB — TSH: TSH: 0.721 u[IU]/mL (ref 0.350–4.500)

## 2015-10-13 LAB — HEPARIN LEVEL (UNFRACTIONATED): Heparin Unfractionated: 0.62 IU/mL (ref 0.30–0.70)

## 2015-10-13 MED ORDER — SODIUM CHLORIDE 0.9 % IV SOLN
250.0000 mL | INTRAVENOUS | Status: DC | PRN
Start: 2015-10-13 — End: 2015-10-14

## 2015-10-13 MED ORDER — NITROGLYCERIN 2 % TD OINT: 1.0000 [in_us] | TOPICAL_OINTMENT | Freq: Three times a day (TID) | TRANSDERMAL | Status: DC

## 2015-10-13 MED ORDER — ONDANSETRON HCL 4 MG/2ML IJ SOLN
4.0000 mg | Freq: Four times a day (QID) | INTRAMUSCULAR | Status: DC | PRN
Start: 2015-10-13 — End: 2015-10-15

## 2015-10-13 MED ORDER — SODIUM CHLORIDE 0.9 % WEIGHT BASED INFUSION
1.0000 mL/kg/h | INTRAVENOUS | Status: DC
  Administered 2015-10-14: 1 mL/kg/h via INTRAVENOUS

## 2015-10-13 MED ORDER — HEPARIN BOLUS VIA INFUSION
3500.0000 [IU] | Freq: Once | INTRAVENOUS | Status: AC
Start: 2015-10-13 — End: 2015-10-13
  Administered 2015-10-13: 3500 [IU] via INTRAVENOUS
  Filled 2015-10-13: qty 3500

## 2015-10-13 MED ORDER — INFLUENZA VAC SPLIT QUAD 0.5 ML IM SUSY
0.5000 mL | PREFILLED_SYRINGE | INTRAMUSCULAR | Status: AC
  Administered 2015-10-15: 11:00:00 0.5 mL via INTRAMUSCULAR
  Filled 2015-10-13: qty 0.5

## 2015-10-13 MED ORDER — ASPIRIN 81 MG PO CHEW
81.0000 mg | CHEWABLE_TABLET | ORAL | Status: AC
Start: 2015-10-14 — End: 2015-10-14
  Administered 2015-10-14: 81 mg via ORAL
  Filled 2015-10-13: qty 1

## 2015-10-13 MED ORDER — ASPIRIN EC 81 MG PO TBEC
81.0000 mg | DELAYED_RELEASE_TABLET | Freq: Every day | ORAL | Status: DC
  Administered 2015-10-15: 11:00:00 81 mg via ORAL
  Filled 2015-10-13: qty 1

## 2015-10-13 MED ORDER — NITROGLYCERIN 2 % TD OINT
1.0000 [in_us] | TOPICAL_OINTMENT | Freq: Three times a day (TID) | TRANSDERMAL | Status: DC
  Administered 2015-10-14: 1 [in_us] via TOPICAL
  Filled 2015-10-13: qty 1

## 2015-10-13 MED ORDER — NITROGLYCERIN 0.4 MG SL SUBL: 0.4000 mg | SUBLINGUAL_TABLET | SUBLINGUAL | Status: DC | PRN

## 2015-10-13 MED ORDER — NITROGLYCERIN 2 % TD OINT
1.0000 [in_us] | TOPICAL_OINTMENT | Freq: Once | TRANSDERMAL | Status: AC
  Administered 2015-10-13: 1 [in_us] via TOPICAL
  Filled 2015-10-13: qty 1

## 2015-10-13 MED ORDER — LISINOPRIL 40 MG PO TABS: 40.0000 mg | ORAL_TABLET | Freq: Every day | ORAL | Status: DC

## 2015-10-13 MED ORDER — ASPIRIN 81 MG PO CHEW: 324.0000 mg | CHEWABLE_TABLET | ORAL | Status: AC

## 2015-10-13 MED ORDER — ATORVASTATIN CALCIUM 80 MG PO TABS
80.0000 mg | ORAL_TABLET | Freq: Every day | ORAL | Status: DC
  Administered 2015-10-13 – 2015-10-14 (×2): 80 mg via ORAL
  Filled 2015-10-13 (×2): qty 1

## 2015-10-13 MED ORDER — FUROSEMIDE 10 MG/ML IJ SOLN: 40.0000 mg | Freq: Every day | INTRAMUSCULAR | Status: DC

## 2015-10-13 MED ORDER — ASPIRIN 300 MG RE SUPP: 300.0000 mg | RECTAL | Status: AC

## 2015-10-13 MED ORDER — SODIUM CHLORIDE 0.9% FLUSH: 3.0000 mL | Freq: Two times a day (BID) | INTRAVENOUS | Status: DC

## 2015-10-13 MED ORDER — ACETAMINOPHEN 325 MG PO TABS
650.0000 mg | ORAL_TABLET | ORAL | Status: DC | PRN
  Administered 2015-10-13 – 2015-10-14 (×2): 650 mg via ORAL
  Filled 2015-10-13 (×2): qty 2

## 2015-10-13 MED ORDER — HEPARIN (PORCINE) IN NACL 100-0.45 UNIT/ML-% IJ SOLN
775.0000 [IU]/h | INTRAMUSCULAR | Status: DC
  Administered 2015-10-13: 800 [IU]/h via INTRAVENOUS
  Filled 2015-10-13: qty 250

## 2015-10-13 MED ORDER — METOPROLOL TARTRATE 12.5 MG HALF TABLET
12.5000 mg | ORAL_TABLET | Freq: Two times a day (BID) | ORAL | Status: DC
  Administered 2015-10-13 – 2015-10-15 (×3): 12.5 mg via ORAL
  Filled 2015-10-13 (×3): qty 1

## 2015-10-13 MED ORDER — SODIUM CHLORIDE 0.9% FLUSH: 3.0000 mL | INTRAVENOUS | Status: DC | PRN

## 2015-10-13 NOTE — H&P (Signed)
[name] is a 55 y.o. female  Admit Date: 10/13/2015 Referring Physician: Kirtland Bouchard. 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 Cardiovascular follow-up has been spotty.  This morning while preparing for work she suddenly developed left arm pain, dizziness, and chest pressure. After the discomfort persisted for 10 minutes, she called EMS. NTG was administered and subsequently NTG paste applied in the ER. She is currently pain free. Says the discomfort persisted for 1 hour before the mentioned therapies led to improvement.  Denies recent chest or similar symptoms. Does note that last week after missing furosemide, she became short of breath. She sleeps on her stomach on her sides without orthopnea. No lower extremity swelling. She has not had palpitations and there is no history of arrhythmia. [name] CABG with MVR 2007: OPERATION: 1. Coronary artery bypass grafting x4 (left internal mammary artery to  left anterior descending, saphenous vein graft to diagonal, saphenous  vein graft to circumflex marginal, saphenous vein graft to right  coronary artery). 2. Mitral valve annuloplasty with a 26-mm Edwards ring, model 4100, serial  number [id number] (McCarthy-Adams).  PMH:    Past Medical History  Diagnosis Date  . Hypercholesteremia   . Atrial fibrillation (HCC)   . HF (heart failure) (HCC)     with LVEF 40% 2008, Echo, functional class 2.  . Obesity   . Coronary atherosclerosis of native coronary artery   . HTN (hypertension)   . Hx of CABG     PSH:    Past Surgical History  Procedure Laterality Date  . Coronary artery bypass graft     ALLERGIES:   Prinivil Prior to Admit Meds:   (Not in a hospital admission) Family HX:    Family History  Problem Relation Age of  Onset  . Family history unknown: Yes   Social HX:    Social History   Social History  . Marital Status: Married    Spouse Name: N/A  . Number of Children: N/A  . Years of Education: N/A   Occupational History  . Not on file.   Social History Main Topics  . Smoking status: Former Games developer  . Smokeless tobacco: Not on file  . Alcohol Use: 0.6 oz/week    0 Standard drinks or equivalent, 1 Cans of beer per week  . Drug Use: No  . Sexual Activity: Not on file   Other Topics Concern  . Not on file   Social History Narrative     ROS She denies tobacco use, chills, fever, and history of syncope. There is dyspnea with moderate exertion. 12 point review of systems is otherwise unremarkable other than as noted above.  Physical Exam: Blood pressure 117/66, pulse 57, temperature 98.1 F (36.7 C), temperature source Oral, resp. rate 22, height  (1.575 m), weight 172 lb (78.019 kg), SpO2 98 %.    Moderately obese African-American female who is in no acute distress. Lying comfortably on her left side watching television on the hospital gurney in room e43. Skin is clear. No jaundice or cyanosis is noted. HEENT exam reveals no jaundice or pallor. Extraocular movements are full. Neck exam reveals no JVD or carotid bruits. Chest is clear to auscultation and percussion.  Cardiac exam reveals no rub. An apical systolic murmur graded 2/6 is  heard. No gallop is audible. Abdomen is soft. Bowel sounds are normal. No fluid wave is noted. Bowel sounds are normal. Extremities reveal no edema. Radial pulses are 2+ and symmetric. Posterior tibial pulses are 1-2+ on the right trace 1+ on the left. Neuro exam reveals no focal deficits.   Labs: Lab Results  Component Value Date   WBC 4.6 10/13/2015   HGB 13.1 10/13/2015   HCT 39.9 10/13/2015   MCV 93.9 10/13/2015   PLT 205 10/13/2015    Recent Labs Lab 10/13/15 0940  NA 143  K 5.1  CL 111  CO2 25  BUN 11  CREATININE 1.03*  CALCIUM  8.6*  GLUCOSE 106*      Radiology:  Dg Chest 2 View  10/13/2015  CLINICAL DATA:  Chest pain and left arm numbness, initial encounter EXAM: CHEST  2 VIEW COMPARISON:  08/19/2006 FINDINGS: Postsurgical changes are again seen. Cardiac shadow is within normal limits. Stable aortic calcifications are seen. The lungs are well aerated bilaterally. No focal infiltrate or sizable effusion is seen. No bony abnormality is noted. IMPRESSION: No active cardiopulmonary disease. Electronically Signed   By: Alcide CleverMark  Lukens M.D.   On: 10/13/2015 10:02   Echocardiogram: 03/2006 SUMMARY - The left ventricle was mildly dilated. Overall left ventricular    systolic function was mildly to moderately decreased. Left    ventricular ejection fraction was estimated , range being 40    % to 45 %. There was mild diffuse left ventricular    hypokinesis with moderate hypokinesis of the posterior wall. - There was trivial aortic valvular regurgitation. - There was severe mitral valvular regurgitation. - The left atrium was mildly dilated. - The estimated peak pulmonary artery systolic pressure was    moderately increased. - There was mild to moderate tricuspid valvular regurgitation. - The right atrium was mildly dilated. - The inferior vena cava was mildly dilated. ---------------------------------------------------------------  EKG:  NSR with inferolateral TWI.  ASSESSMENT: 1. Non-ST elevation myocardial infarction. Currently clinically stable. Suspect bypass graft occlusion. Rule out progression of disease. 2. Status post mitral valve repair with persistent although I suspect clinically mild mitral regurgitation. 3. Hyperlipidemia 4. Essential hypertension 5. Probable medical compliance issues.  Plan: 1. Admit to the hospital for an ischemic evaluation in setting of non-ST elevation MI 2.The patient was counseled to undergo left heart catheterization, coronary angiography,  and possible percutaneous coronary intervention with stent implantation. The procedural risks and benefits were discussed in detail. The risks discussed included death, stroke, myocardial infarction, life-threatening bleeding, limb ischemia, kidney injury, allergy, and possible emergency cardiac surgery. The risk of these significant complications were estimated to occur less than 1% of the time. After discussion, the patient has agreed to proceed. 3. IV heparin 4. Long-acting nitrates  5. 2-D Doppler echocardiogram 6. High intensity statin therapy 7. Hemoglobin A1c  Lesleigh NoeSMITH III,Yeslin Delio W 10/13/2015 12:37 PM

## 2015-10-13 NOTE — ED Notes (Signed)
Per EMS, patient at home with chest pain L chest that radiates to back.   Patient states that she was sitting at her computer/desk and started having chest pain 6/10.  Patient states some nausea with chest pain but denies other symptoms.   Per EMS, patient given NTG x 2, ASA 324mg  en route.   Patient states her pain at 1/10 at this point.   Patient vital signs WNL until after her NTG and had a small drop in pressure (no values given from EMS).    Patient A&O x 4.

## 2015-10-13 NOTE — ED Notes (Signed)
Verified heparin drip with Chrislyn, RN.

## 2015-10-13 NOTE — ED Notes (Signed)
Paged and spoke with Cardiology regarding no troponin labs ordered. Stated will order troponin labs.

## 2015-10-13 NOTE — ED Provider Notes (Signed)
CSN: [id number]     Arrival date & time 10/13/15  0909 History   First MD Initiated Contact with Patient 10/13/15 0913     Chief Complaint  Patient presents with  . Chest Pain     (Consider location/radiation/quality/duration/timing/severity/associated sxs/prior Treatment) Patient is a 55 y.o. female presenting with chest pain. The history is provided by the patient.  Chest Pain Pain location:  L chest Pain quality: tightness   Pain radiates to:  L shoulder and L arm Pain radiates to the back: no   Pain severity:  Moderate Onset quality:  Sudden Duration:  2 hours Timing:  Constant Progression:  Partially resolved Chronicity:  New Context: at rest   Relieved by:  Aspirin and nitroglycerin Worsened by:  Nothing tried Ineffective treatments:  None tried Associated symptoms: nausea   Associated symptoms: no abdominal pain, no cough, no fever, no shortness of breath and not vomiting   Risk factors: coronary artery disease (s/p remote CABG), high cholesterol, hypertension and obesity     Past Medical History  Diagnosis Date  . Hypercholesteremia   . Atrial fibrillation (HCC)   . HF (heart failure) (HCC)     with LVEF 40% 2008, Echo, functional class 2.  . Obesity   . Coronary atherosclerosis of native coronary artery   . HTN (hypertension)   . Hx of CABG    Past Surgical History  Procedure Laterality Date  . Coronary artery bypass graft     Family History  Problem Relation Age of Onset  . Family history unknown: Yes   Social History  Substance Use Topics  . Smoking status: Former Games developer  . Smokeless tobacco: Not on file  . Alcohol Use: 0.0 oz/week    0 Standard drinks or equivalent per week   OB History    No data available     Review of Systems  Constitutional: Negative for fever.  Respiratory: Negative for cough and shortness of breath.   Cardiovascular: Positive for chest pain.  Gastrointestinal: Positive for nausea. Negative for vomiting and abdominal  pain.  All other systems reviewed and are negative.     Allergies  Prinivil  Home Medications   Prior to Admission medications   Medication Sig Start Date End Date Taking? Authorizing Provider  amLODipine (NORVASC) 5 MG tablet Take 5 mg by mouth daily.    Historical Provider, MD  aspirin 81 MG tablet Take 81 mg by mouth daily.    Historical Provider, MD  carvedilol (COREG) 12.5 MG tablet TAKE 1 TABLET (12.5 MG TOTAL) BY MOUTH 2 (TWO) TIMES DAILY WITH A MEAL. 09/10/15   Lyn Records, MD  furosemide (LASIX) 40 MG tablet TAKE 1 TABLET BY MOUTH EVERY DAY PLEASE SCHEDULE APPT [phone] 09/10/15   Lyn Records, MD  lisinopril (PRINIVIL,ZESTRIL) 40 MG tablet TAKE 1 TABLET (40 MG TOTAL) BY MOUTH DAILY. 09/10/15   Lyn Records, MD  nitroGLYCERIN (NITROSTAT) 0.4 MG SL tablet Place 0.4 mg under the tongue every 5 (five) minutes as needed for chest pain.    Historical Provider, MD  potassium chloride (K-DUR) 10 MEQ tablet Take 10 mEq by mouth daily.    Historical Provider, MD  pravastatin (PRAVACHOL) 40 MG tablet Take 40 mg by mouth daily.    Historical Provider, MD   There were no vitals taken for this visit. Physical Exam  Constitutional: She is oriented to person, place, and time. She appears well-developed and well-nourished. No distress.  HENT:  Head: Normocephalic.  Eyes: Conjunctivae are normal.  Neck: Neck supple. No tracheal deviation present.  Cardiovascular: Normal rate, regular rhythm and normal heart sounds.   Pulmonary/Chest: Effort normal and breath sounds normal. No respiratory distress.  Abdominal: Soft. She exhibits no distension. There is no tenderness.  Neurological: She is alert and oriented to person, place, and time.  Skin: Skin is warm and dry.  Psychiatric: She has a normal mood and affect.  Vitals reviewed.   ED Course  Procedures (including critical care time)  CRITICAL CARE Performed by: Lyndal PulleyKnott, Ellakate Gonsalves Total critical care time: 30 minutes Critical care  time was exclusive of separately billable procedures and treating other patients. Critical care was necessary to treat or prevent imminent or life-threatening deterioration. Critical care was time spent personally by me on the following activities: development of treatment plan with patient and/or surrogate as well as nursing, discussions with consultants, evaluation of patient's response to treatment, examination of patient, obtaining history from patient or surrogate, ordering and performing treatments and interventions, ordering and review of laboratory studies, ordering and review of radiographic studies, pulse oximetry and re-evaluation of patient's condition.   Labs Review Labs Reviewed  Rosezena SensorI-STAT TROPOININ, ED - Abnormal; Notable for the following:    Troponin i, poc 0.12 (*)    All other components within normal limits  CBC  BASIC METABOLIC PANEL  MAGNESIUM    Imaging Review Dg Chest 2 View  10/13/2015  CLINICAL DATA:  Chest pain and left arm numbness, initial encounter EXAM: CHEST  2 VIEW COMPARISON:  08/19/2006 FINDINGS: Postsurgical changes are again seen. Cardiac shadow is within normal limits. Stable aortic calcifications are seen. The lungs are well aerated bilaterally. No focal infiltrate or sizable effusion is seen. No bony abnormality is noted. IMPRESSION: No active cardiopulmonary disease. Electronically Signed   By: Alcide CleverMark  Lukens M.D.   On: 10/13/2015 10:02   I have personally reviewed and evaluated these images and lab results as part of my medical decision-making.   EKG Interpretation   Date/Time:  Monday October 13 2015 09:20:21 EDT Ventricular Rate:  70 PR Interval:  250 QRS Duration: 102 QT Interval:  448 QTC Calculation: 483 R Axis:   24 Text Interpretation:  Sinus rhythm Prolonged PR interval LVH with  secondary repolarization abnormality Nonspecific T wave abnormality now  evident in Inferolateral leads New since previous tracing from 04-11-2006  Reconfirmed by  Glendale Wherry MD, Diamantina Edinger [phone]) on 10/13/2015 9:27:57 AM      MDM   Final diagnoses:  NSTEMI (non-ST elevated myocardial infarction) [facility](HCC)    55 year old female with history of coronary artery disease status post stenting and remote CABG presents with chest pain described as tightness over her left chest at rest, improved with sublingual nitroglycerin and aspirin en route with EMS. No recent EKGs are available but compared to previous tracing there are new T-wave changes concerning for cardiac ischemia accompanied by typical symptoms. Initial troponin is positive despite recent onset of pain, started on heparin drip and nitroglycerin paste applied for treatment of NSTEMI. Cardiology was consulted for admission and will see the patient in the emergency department.     Lyndal Pulleyaniel Shambhavi Salley, MD 10/13/15 2204

## 2015-10-13 NOTE — ED Notes (Signed)
ED Doctor stated to wait Cardiology if patient can eat or drink. Patient verbalized understanding.

## 2015-10-13 NOTE — Progress Notes (Signed)
ANTICOAGULATION CONSULT NOTE - Follow Up Consult  Pharmacy Consult for heparin Indication: chest pain/ACS  Allergies  Allergen Reactions  . Prinivil [Lisinopril]     COUGH    Patient Measurements: Height: 5\' 2"  (157.5 cm) Weight: 172 lb (78.019 kg) IBW/kg (Calculated) : 50.1 Heparin Dosing Weight: 65  Vital Signs: Temp: 98.1 F (36.7 C) (03/27 0925) Temp Source: Oral (03/27 0925) BP: 115/57 mmHg (03/27 1645) Pulse Rate: 79 (03/27 1515)  Labs:  Recent Labs  10/13/15 0940 10/13/15 1345 10/13/15 1701  HGB 13.1 13.4  --   HCT 39.9 41.1  --   PLT 205 207  --   LABPROT  --  15.2  --   INR  --  1.18  --   HEPARINUNFRC  --   --  0.62  CREATININE 1.03* 1.05*  --     Medical History: Past Medical History  Diagnosis Date  . Hypercholesteremia   . Atrial fibrillation (HCC)   . HF (heart failure) (HCC)     with LVEF 40% 2008, Echo, functional class 2.  . Obesity   . Coronary atherosclerosis of native coronary artery   . HTN (hypertension)   . Hx of CABG   . NSTEMI (non-ST elevated myocardial infarction) (HCC) 10/13/2015    Assessment: [age] yoF admitted with left CP radiating to shoulder and arm. Troponin 0.12 and EKG concerning for T-wave changes. Pharmacy consulted for heparin dosing. No anticoagulation PTA, ASA received en route to hospital Heparin drip 800 uts/hr HL 0.62 at goal.  No bleeding noted  Goal of Therapy:  Heparin level 0.3-0.7 units/ml Monitor platelets by anticoagulation protocol: Yes   Plan:  Continue Heparin drip 800 uts/hr Daily HL, CBC  Leota SauersLisa Laiyah Exline Pharm.D. CPP, BCPS Clinical Pharmacist [phone] 10/13/2015 6:29 PM

## 2015-10-13 NOTE — ED Notes (Signed)
Gave pt Sprite, per Tammy SoursGreg - RN.

## 2015-10-13 NOTE — Progress Notes (Signed)
ANTICOAGULATION CONSULT NOTE - Initial Consult  Pharmacy Consult for heparin Indication: chest pain/ACS  Allergies  Allergen Reactions  . Prinivil [Lisinopril]     COUGH    Patient Measurements: Height: 5\' 2"  (157.5 cm) Weight: 172 lb (78.019 kg) IBW/kg (Calculated) : 50.1 Heparin Dosing Weight: 65  Vital Signs: Temp: 98.1 F (36.7 C) (03/27 0925) Temp Source: Oral (03/27 0925) BP: 109/67 mmHg (03/27 0945) Pulse Rate: 57 (03/27 0945)  Labs:  Recent Labs  10/13/15 0940  HGB 13.1  HCT 39.9  PLT 205    Medical History: Past Medical History  Diagnosis Date  . Hypercholesteremia   . Atrial fibrillation (HCC)   . HF (heart failure) (HCC)     with LVEF 40% 2008, Echo, functional class 2.  . Obesity   . Coronary atherosclerosis of native coronary artery   . HTN (hypertension)   . Hx of CABG     Assessment: [name] admitted with left CP radiating to shoulder and arm. Troponin 0.12 and EKG concerning for T-wave changes. Pharmacy consulted for heparin dosing. No anticoagulation PTA, ASA received en route to hospital  Goal of Therapy:  Heparin level 0.3-0.7 units/ml Monitor platelets by anticoagulation protocol: Yes   Plan:  Give 3500 units bolus x 1 Start heparin infusion at 800 units/hr Check anti-Xa level in 6 hours and daily while on heparin Continue to monitor H&H and platelets  Candice NightingaleJames A Amardeep Thomas 10/13/2015,10:25 AM

## 2015-10-14 ENCOUNTER — Encounter (HOSPITAL_COMMUNITY)
Admission: EM | Disposition: A | Discharge status: Home / Self Care | Payer: Self-pay | Source: Home / Self Care | Attending: Interventional Cardiology

## 2015-10-14 ENCOUNTER — Other Ambulatory Visit (HOSPITAL_COMMUNITY): Payer: 59

## 2015-10-14 ENCOUNTER — Encounter (HOSPITAL_COMMUNITY): Payer: Self-pay | Admitting: Cardiology

## 2015-10-14 DIAGNOSIS — I480 Paroxysmal atrial fibrillation: Secondary | ICD-10-CM

## 2015-10-14 DIAGNOSIS — I25710 Atherosclerosis of autologous vein coronary artery bypass graft(s) with unstable angina pectoris: Secondary | ICD-10-CM

## 2015-10-14 HISTORY — PX: CARDIAC CATHETERIZATION: SHX172

## 2015-10-14 LAB — LIPID PANEL
CHOLESTEROL: 177 mg/dL (ref 0–200)
HDL: 50 mg/dL (ref >40)
LDL CALC: 105 mg/dL — AB (ref 0–99)
TRIGLYCERIDES: 108 mg/dL (ref <150)
Total CHOL/HDL Ratio: 3.5 RATIO
VLDL: 22 mg/dL (ref 0–40)

## 2015-10-14 LAB — HEMOGLOBIN A1C
HEMOGLOBIN A1C: 5.9 % — AB (ref 4.8–5.6)
MEAN PLASMA GLUCOSE: 123 mg/dL

## 2015-10-14 LAB — BASIC METABOLIC PANEL
Anion gap: 7 (ref 5–15)
BUN: 13 mg/dL (ref 6–20)
CO2: 25 mmol/L (ref 22–32)
Calcium: 8.9 mg/dL (ref 8.9–10.3)
Chloride: 109 mmol/L (ref 101–111)
Creatinine, Ser: 1.07 mg/dL — ABNORMAL HIGH (ref 0.44–1.00)
GFR calc Af Amer: >60 mL/min (ref >60)
GFR, EST NON AFRICAN AMERICAN: 57 mL/min — AB (ref >60)
GLUCOSE: 111 mg/dL — AB (ref 65–99)
POTASSIUM: 3.8 mmol/L (ref 3.5–5.1)
Sodium: 141 mmol/L (ref 135–145)

## 2015-10-14 LAB — HEPARIN LEVEL (UNFRACTIONATED): Heparin Unfractionated: 0.53 IU/mL (ref 0.30–0.70)

## 2015-10-14 LAB — POCT ACTIVATED CLOTTING TIME
ACTIVATED CLOTTING TIME: 214 s
ACTIVATED CLOTTING TIME: 286 s
ACTIVATED CLOTTING TIME: 461 s

## 2015-10-14 SURGERY — LEFT HEART CATH AND CORS/GRAFTS ANGIOGRAPHY

## 2015-10-14 MED ORDER — SODIUM CHLORIDE 0.9 % IV SOLN: 250.0000 mL | INTRAVENOUS | Status: DC | PRN

## 2015-10-14 MED ORDER — MIDAZOLAM HCL 2 MG/2ML IJ SOLN
INTRAMUSCULAR | Status: DC | PRN
  Administered 2015-10-14: 1 mg via INTRAVENOUS

## 2015-10-14 MED ORDER — FUROSEMIDE 10 MG/ML IJ SOLN
INTRAMUSCULAR | Status: AC
  Filled 2015-10-14: qty 4

## 2015-10-14 MED ORDER — IOPAMIDOL (ISOVUE-370) INJECTION 76%
INTRAVENOUS | Status: DC | PRN
  Administered 2015-10-14: 210 mL via INTRA_ARTERIAL

## 2015-10-14 MED ORDER — VERAPAMIL HCL 2.5 MG/ML IV SOLN
INTRAVENOUS | Status: DC | PRN
  Administered 2015-10-14 (×2): 20 mL via INTRA_ARTERIAL

## 2015-10-14 MED ORDER — LIDOCAINE HCL (PF) 1 % IJ SOLN
INTRAMUSCULAR | Status: AC
  Filled 2015-10-14: qty 30

## 2015-10-14 MED ORDER — MIDAZOLAM HCL 2 MG/2ML IJ SOLN
INTRAMUSCULAR | Status: AC
  Filled 2015-10-14: qty 2

## 2015-10-14 MED ORDER — FENTANYL CITRATE (PF) 100 MCG/2ML IJ SOLN
INTRAMUSCULAR | Status: DC | PRN
  Administered 2015-10-14: 25 ug via INTRAVENOUS

## 2015-10-14 MED ORDER — SODIUM CHLORIDE 0.9% FLUSH
3.0000 mL | Freq: Two times a day (BID) | INTRAVENOUS | Status: DC
  Administered 2015-10-14: 3 mL via INTRAVENOUS

## 2015-10-14 MED ORDER — IOPAMIDOL (ISOVUE-370) INJECTION 76%
INTRAVENOUS | Status: AC
  Filled 2015-10-14: qty 100

## 2015-10-14 MED ORDER — HEPARIN (PORCINE) IN NACL 2-0.9 UNIT/ML-% IJ SOLN
INTRAMUSCULAR | Status: AC
  Filled 2015-10-14: qty 1000

## 2015-10-14 MED ORDER — HEPARIN SODIUM (PORCINE) 1000 UNIT/ML IJ SOLN
INTRAMUSCULAR | Status: AC
  Filled 2015-10-14: qty 1

## 2015-10-14 MED ORDER — VERAPAMIL HCL 2.5 MG/ML IV SOLN
INTRAVENOUS | Status: AC
  Filled 2015-10-14: qty 2

## 2015-10-14 MED ORDER — SODIUM CHLORIDE 0.9% FLUSH: 3.0000 mL | INTRAVENOUS | Status: DC | PRN

## 2015-10-14 MED ORDER — HEPARIN (PORCINE) IN NACL 2-0.9 UNIT/ML-% IJ SOLN
INTRAMUSCULAR | Status: DC | PRN
  Administered 2015-10-14: 1000 mL

## 2015-10-14 MED ORDER — FUROSEMIDE 10 MG/ML IJ SOLN
40.0000 mg | Freq: Once | INTRAMUSCULAR | Status: AC
  Administered 2015-10-14: 40 mg via INTRAVENOUS

## 2015-10-14 MED ORDER — HEPARIN SODIUM (PORCINE) 1000 UNIT/ML IJ SOLN
INTRAMUSCULAR | Status: DC | PRN
  Administered 2015-10-14: 2000 [IU] via INTRAVENOUS
  Administered 2015-10-14: 4000 [IU] via INTRAVENOUS
  Administered 2015-10-14: 3000 [IU] via INTRAVENOUS

## 2015-10-14 MED ORDER — TICAGRELOR 90 MG PO TABS
ORAL_TABLET | ORAL | Status: AC
  Filled 2015-10-14: qty 2

## 2015-10-14 MED ORDER — FENTANYL CITRATE (PF) 100 MCG/2ML IJ SOLN
INTRAMUSCULAR | Status: AC
  Filled 2015-10-14: qty 2

## 2015-10-14 MED ORDER — TICAGRELOR 90 MG PO TABS
90.0000 mg | ORAL_TABLET | Freq: Two times a day (BID) | ORAL | Status: DC
Start: 2015-10-14 — End: 2015-10-15
  Administered 2015-10-14 – 2015-10-15 (×2): 90 mg via ORAL
  Filled 2015-10-14 (×2): qty 1

## 2015-10-14 MED ORDER — LIDOCAINE HCL (PF) 1 % IJ SOLN
INTRAMUSCULAR | Status: DC | PRN
  Administered 2015-10-14: 5 mL

## 2015-10-14 MED ORDER — SODIUM CHLORIDE 0.9 % WEIGHT BASED INFUSION: 1.0000 mL/kg/h | INTRAVENOUS | Status: AC

## 2015-10-14 MED ORDER — TICAGRELOR 90 MG PO TABS
ORAL_TABLET | ORAL | Status: DC | PRN
  Administered 2015-10-14: 180 mg via ORAL

## 2015-10-14 MED ORDER — IOPAMIDOL (ISOVUE-370) INJECTION 76%
INTRAVENOUS | Status: AC
  Filled 2015-10-14: qty 50

## 2015-10-14 MED ORDER — MORPHINE SULFATE (PF) 2 MG/ML IV SOLN: 2.0000 mg | INTRAVENOUS | Status: DC | PRN

## 2015-10-14 SURGICAL SUPPLY — 19 items
BALLN EUPHORA RX 3.0X12 (BALLOONS) ×2
BALLN ~~LOC~~ EMERGE MR 4.5X12 (BALLOONS) ×2
BALLOON EUPHORA RX 3.0X12 (BALLOONS) IMPLANT
BALLOON ~~LOC~~ EMERGE MR 4.5X12 (BALLOONS) IMPLANT
CATH INFINITI 5FR MULTPACK ANG (CATHETERS) ×1 IMPLANT
DEVICE RAD COMP TR BAND LRG (VASCULAR PRODUCTS) ×1 IMPLANT
FILTERWIRE EZ 3.5-5.5 190CM (WIRE) ×1 IMPLANT
GLIDESHEATH SLEND A-KIT 6F 22G (SHEATH) ×1 IMPLANT
GUIDE CATH RUNWAY 6FR AL 1 (CATHETERS) ×1 IMPLANT
GUIDE CATH RUNWAY 6FR FR4 (CATHETERS) ×1 IMPLANT
KIT ENCORE 26 ADVANTAGE (KITS) ×1 IMPLANT
KIT HEART LEFT (KITS) ×2 IMPLANT
PACK CARDIAC CATHETERIZATION (CUSTOM PROCEDURE TRAY) ×2 IMPLANT
STENT SYNERGY DES 4X16 (Permanent Stent) ×1 IMPLANT
SYR MEDRAD MARK V 150ML (SYRINGE) ×2 IMPLANT
TRANSDUCER W/STOPCOCK (MISCELLANEOUS) ×2 IMPLANT
TUBING CIL FLEX 10 FLL-RA (TUBING) ×2 IMPLANT
WIRE HI TORQ BMW 190CM (WIRE) ×1 IMPLANT
WIRE SAFE-T 1.5MM-J .035X260CM (WIRE) ×1 IMPLANT

## 2015-10-14 NOTE — Care Management Note (Signed)
Case Management Note  Patient Details  Name: [name] MRN: [id number] Date of Birth: 14-Feb-1961  Subjective/Objective:   Patient will be participating in the Twilight study for [place].                  Action/Plan:   Expected Discharge Date:                  Expected Discharge Plan:  Home/Self Care  In-House Referral:     Discharge planning Services  CM Consult  Post Acute Care Choice:    Choice offered to:     DME Arranged:    DME Agency:     HH Arranged:    HH Agency:     Status of Service:  Completed, signed off  Medicare Important Message Given:    Date Medicare IM Given:    Medicare IM give by:    Date Additional Medicare IM Given:    Additional Medicare Important Message give by:     If discussed at Long Length of Stay Meetings, dates discussed:    Additional Comments:  Leone Havenaylor, Makynzee Tigges Clinton, RN 10/14/2015, 2:13 PM

## 2015-10-14 NOTE — Research (Signed)
TWILIGHT Informed Consent   Subject Name: [name]  Subject met inclusion and exclusion criteria.  The informed consent form, study requirements and expectations were reviewed with the subject and questions and concerns were addressed prior to the signing of the consent form.  The subject verbalized understanding of the trail requirements.  The subject agreed to participate in the TWILIGHT trial and signed the informed consent.  The informed consent was obtained prior to performance of any protocol-specific procedures for the subject.  A copy of the signed informed consent was given to the subject and a copy was placed in the subject's medical record.  Jake Bathe Jr. 10/14/2015, 1240PM

## 2015-10-14 NOTE — Progress Notes (Signed)
Assisted to BR to void. Back to stretcher. Not as short of breath.

## 2015-10-14 NOTE — Progress Notes (Signed)
       Patient Name: Candice ShortJanet Thomas Date of Encounter: 10/14/2015    SUBJECTIVE: No recurrent chest or dyspnea. Having cath soon.  TELEMETRY:  NSR Filed Vitals:   10/13/15 1645 10/13/15 1839 10/14/15 0552 10/14/15 0905  BP: 115/57 123/57 140/72   Pulse:  59 70   Temp:   98 F (36.7 C)   TempSrc:   Oral   Resp: 28  20   Height:      Weight:   169 lb (76.658 kg)   SpO2:  100% 100% 100%   No intake or output data in the 24 hours ending 10/14/15 0950 LABS: Basic Metabolic Panel:  Recent Labs  13/02/6502/27/17 0940 10/13/15 1345 10/14/15 0431  NA 143 143 141  K 5.1 4.4 3.8  CL 111 109 109  CO2 25 27 25   GLUCOSE 106* 99 111*  BUN 11 10 13   CREATININE 1.03* 1.05* 1.07*  CALCIUM 8.6* 9.3 8.9  MG 2.2  --   --    CBC:  Recent Labs  10/13/15 0940 10/13/15 1345  WBC 4.6 5.7  NEUTROABS  --  3.0  HGB 13.1 13.4  HCT 39.9 41.1  MCV 93.9 93.8  PLT 205 207   Hemoglobin A1C:  Recent Labs  10/13/15 1346  HGBA1C 5.9*   Fasting Lipid Panel:  Recent Labs  10/14/15 0431  CHOL 177  HDL 50  LDLCALC 105*  TRIG 108  CHOLHDL 3.5    Radiology/Studies:  CXR with no acute problem.  Physical Exam: Blood pressure 140/72, pulse 70, temperature 98 F (36.7 C), temperature source Oral, resp. rate 20, height 5\' 2"  (1.575 m), weight 169 lb (76.658 kg), SpO2 100 %. Weight change:   Wt Readings from Last 3 Encounters:  10/14/15 169 lb (76.658 kg)   2/6 systolic murmur  ASSESSMENT:  1. CAD with suspected bypass graft failure. 2. Hypertension 3. Mitral regurgitation, ? Significance.  Plan:  1. Cath today 2. Cycle cardiac markers 3. Encourage medication compliance.   Selinda EonSigned, SMITH III,HENRY W 10/14/2015, 9:50 AM

## 2015-10-14 NOTE — Progress Notes (Signed)
Unable to use bedpan after 2 attempts. Assisted to BR to void; uop missed measuring hat. Back to stretcher. O2 sat 98% room air. RR 27, regular and slightly labored. Skin w/d. Pink. Placed on O2 2L [place].

## 2015-10-15 ENCOUNTER — Telehealth: Payer: Self-pay

## 2015-10-15 ENCOUNTER — Encounter (HOSPITAL_COMMUNITY): Payer: Self-pay | Admitting: Cardiology

## 2015-10-15 ENCOUNTER — Inpatient Hospital Stay (HOSPITAL_COMMUNITY): Payer: 59

## 2015-10-15 ENCOUNTER — Other Ambulatory Visit: Payer: Self-pay | Admitting: *Deleted

## 2015-10-15 DIAGNOSIS — I2510 Atherosclerotic heart disease of native coronary artery without angina pectoris: Secondary | ICD-10-CM

## 2015-10-15 DIAGNOSIS — I249 Acute ischemic heart disease, unspecified: Secondary | ICD-10-CM | POA: Diagnosis present

## 2015-10-15 LAB — CBC
HEMATOCRIT: 39.3 % (ref 36.0–46.0)
Hemoglobin: 12.7 g/dL (ref 12.0–15.0)
MCH: 30.3 pg (ref 26.0–34.0)
MCHC: 32.3 g/dL (ref 30.0–36.0)
MCV: 93.8 fL (ref 78.0–100.0)
PLATELETS: 192 10*3/uL (ref 150–400)
RBC: 4.19 MIL/uL (ref 3.87–5.11)
RDW: 14.1 % (ref 11.5–15.5)
WBC: 5.4 10*3/uL (ref 4.0–10.5)

## 2015-10-15 LAB — ECHOCARDIOGRAM COMPLETE
Height: 62 in
WEIGHTICAEL: 2719.59 [oz_av]

## 2015-10-15 LAB — BASIC METABOLIC PANEL
Anion gap: 10 (ref 5–15)
BUN: 11 mg/dL (ref 6–20)
CHLORIDE: 107 mmol/L (ref 101–111)
CO2: 25 mmol/L (ref 22–32)
CREATININE: 1.09 mg/dL — AB (ref 0.44–1.00)
Calcium: 9.8 mg/dL (ref 8.9–10.3)
GFR calc Af Amer: >60 mL/min (ref >60)
GFR calc non Af Amer: 56 mL/min — ABNORMAL LOW (ref >60)
GLUCOSE: 111 mg/dL — AB (ref 65–99)
POTASSIUM: 4.3 mmol/L (ref 3.5–5.1)
Sodium: 142 mmol/L (ref 135–145)

## 2015-10-15 MED ORDER — ASPIRIN 81 MG PO TBEC: 81.0000 mg | DELAYED_RELEASE_TABLET | Freq: Every day | ORAL | Status: DC

## 2015-10-15 MED ORDER — LISINOPRIL 40 MG PO TABS: 40.0000 mg | ORAL_TABLET | Freq: Every day | ORAL | Status: DC

## 2015-10-15 MED ORDER — FUROSEMIDE 40 MG PO TABS: 40.0000 mg | ORAL_TABLET | Freq: Every day | ORAL | Status: DC

## 2015-10-15 MED ORDER — ANGIOPLASTY BOOK
Freq: Once | Status: DC
  Filled 2015-10-15: qty 1

## 2015-10-15 MED ORDER — AMBULATORY NON FORMULARY MEDICATION: 90.0000 mg | Freq: Two times a day (BID) | Status: DC

## 2015-10-15 MED ORDER — CARVEDILOL 12.5 MG PO TABS: 12.5000 mg | ORAL_TABLET | Freq: Two times a day (BID) | ORAL | Status: DC

## 2015-10-15 MED ORDER — TICAGRELOR 90 MG PO TABS: 90.0000 mg | ORAL_TABLET | Freq: Two times a day (BID) | ORAL | Status: DC

## 2015-10-15 MED ORDER — AMBULATORY NON FORMULARY MEDICATION: 81.0000 mg | Freq: Every day | Status: DC

## 2015-10-15 MED ORDER — METOPROLOL TARTRATE 25 MG PO TABS: 12.5000 mg | ORAL_TABLET | Freq: Two times a day (BID) | ORAL | Status: DC

## 2015-10-15 MED ORDER — LOSARTAN POTASSIUM 25 MG PO TABS: 25.0000 mg | ORAL_TABLET | Freq: Every day | ORAL | Status: DC

## 2015-10-15 MED ORDER — HEART ATTACK BOUNCING BOOK
Freq: Once | Status: AC
  Administered 2015-10-15: 11:00:00
  Filled 2015-10-15: qty 1

## 2015-10-15 MED ORDER — ATORVASTATIN CALCIUM 80 MG PO TABS: 80.0000 mg | ORAL_TABLET | Freq: Every day | ORAL | Status: DC

## 2015-10-15 MED ORDER — NITROGLYCERIN 0.4 MG SL SUBL: 0.4000 mg | SUBLINGUAL_TABLET | SUBLINGUAL | Status: DC | PRN

## 2015-10-15 MED FILL — Verapamil HCl IV Soln 2.5 MG/ML: INTRAVENOUS | Qty: 2 | Status: AC

## 2015-10-15 NOTE — Progress Notes (Signed)
  Echocardiogram 2D Echocardiogram has been performed.  Janalyn HarderWest, Kessa Fairbairn R 10/15/2015, 10:52 AM

## 2015-10-15 NOTE — Discharge Summary (Signed)
Discharge Summary    Patient ID: [name],  MRN: [id number], DOB/AGE: [birth year] 55 y.o.  Admit date: 10/13/2015 Discharge date: 10/15/2015  Primary Care Provider: Cam Hai Primary Cardiologist: Dr Katrinka Blazing  Discharge Diagnoses    Principal Problem:   ACS (acute coronary syndrome) [facility]) Active Problems:   Hypercholesteremia   Atrial fibrillation (HCC)   Chronic combined systolic and diastolic HF (heart failure), NYHA class 2 (HCC)   Coronary atherosclerosis of native coronary artery   HTN (hypertension)   NSTEMI (non-ST elevated myocardial infarction) (HCC)   Allergies Allergies  Allergen Reactions  . Prinivil [Lisinopril]     COUGH    Diagnostic Studies/Procedures    Cardiac cath: 10/14/2015 1. Mid Graft lesion in SVG-dRCA , 95% stenosed. Post protected intervention Synergy DES 4.0 mm x 60 mm (4.6 m) , there is a 0% residual stenosis. 2. Mid RCA lesion, 100% stenosed. Mid RCA to Dist RCA lesion, 45% stenosed - at the anastomosis. Minimal disease downstream. 3. Prox Cx lesion, 70% stenosed. Prox Cx to Mid Cx lesion, 100% stenosed. 1st Mrg lesion, 65% stenosed. 4. Prox LAD to Mid LAD lesion, 100% stenosed. - Fills the widely patent LIMA graft 5. SVG-DIAG widely patent. Antegrade flow fills a moderate caliber diagonal vessel. Retrograde flow fills into the LAD, perfusing the SP1 with competitive flow in the LAD 6. Origin to Prox SVG-OM Graft lesion, 100% stenosed. 7. There is moderate to severe left ventricular systolic dysfunction, EF 25-35%. Initial post CABG angiography reveals widely patent LIMA graft to a relatively normal distal LAD as well as widely patent vein graft to moderate size D1. The native circumflex that was noted to be occluded is still occluded and the graft to this vessel is also occluded. The native RCA is now 100% occluded in the mid vessel with severe disease in the vein graft to the distal RCA. Successful PCI of the large caliber vein graft to  the RCA with a DES stent. There is some mild-moderate disease at the anastomosis. Plan:  Transfer to 6 [facility] for standard post radial PCI care. TR band removal per protocol.  Oral Brilinta plus aspirin for minimum of 3 months. At that time aspirin can be suspended with Brilinta monotherapy.  Continue aggressive risk factor modification with blood pressure and lipid control.  Would recommend 2-D echocardiogram to better assess the LVEF and consider addition of standing diuretic as well as increased afterload reduction. _____________   History of Present Illness     55 yo with h/o CAD, prior LAD Promus DES 2003 and ultimately CABG with MV repair in 2007 for CHF related to multivessel CAD and MR. Cardiovascular follow-up has been spotty.  While preparing for work she suddenly developed left arm pain, dizziness, and chest pressure. She called EMS and was pain-free on nitrates. She was admitted for further evaluation and treatment.  Hospital Course     Consultants: None   Her POC troponin was mildly elevated at 0.12. Her symptoms were concerning for angina/ACS, so she was taken to the cath lab on 03/22.  Results are above, she got a DES to the SVG-dRCA, with improvement from 95% to 0%. Medical therapy for 100% CFX and its graft occlusion, other grafts patent with minimal distal disease. EF 25-35% at cath, echo performed for further evaluation of EF and MR, results pending.  On 03/29, Ms Kerkman was seen by Dr 57 Katrinka Blazing and all data were reviewed. She is reportedly intolerant of Prinivil 2nd cough,  but has been tolerating lisinopril 40 mg daily without difficulty. She was also on Coreg 12.5 mg and Lasix 40 mg PTA, these were continued. She will be on Brilinta plus ASA for now, participating in the [place] study. She was seen by cardiac rehab and educated on stent restrictions, heart-healthy lifestyle modifications and exercise guidelines. No further inpatient workup was  indicated and she is considered stable for discharge, to follow up as an outpatient. _____________  Discharge Vitals Blood pressure 157/60, pulse 65, temperature 98.2 F (36.8 C), temperature source Oral, resp. rate 19, height  (1.575 m), weight 169 lb 15.6 oz (77.1 kg), SpO2 100 %.  Filed Weights   10/13/15 0925 10/14/15 0552 10/15/15 0602  Weight: 172 lb (78.019 kg) 169 lb (76.658 kg) 169 lb 15.6 oz (77.1 kg)  General: Well developed, well nourished, female in no acute distress Head: Eyes PERRLA, No xanthomas.   Normocephalic and atraumatic  Lungs: Clear bilaterally to auscultation. Heart: HRRR S1 S2, without MRG.  Pulses are 2+ & equal. No JVD. Abdomen: Bowel sounds are present, abdomen soft and non-tender without masses or  hernias noted. Msk: Normal strength and tone for age. Extremities: No clubbing, cyanosis or edema.    Skin:  No rashes or lesions noted. Neuro: Alert and oriented X 3. Psych:  Good affect, responds appropriately   Labs & Radiologic Studies    CBC  Recent Labs  10/13/15 1345 10/15/15 0525  WBC 5.7 5.4  NEUTROABS 3.0  --   HGB 13.4 12.7  HCT 41.1 39.3  MCV 93.8 93.8  PLT 207 192   Basic Metabolic Panel  Recent Labs  10/13/15 0940  10/14/15 0431 10/15/15 0525  NA 143  < > 141 142  K 5.1  < > 3.8 4.3  CL 111  < > 109 107  CO2 25  < > 25 25  GLUCOSE 106*  < > 111* 111*  BUN 11  < > 13 11  CREATININE 1.03*  < > 1.07* 1.09*  CALCIUM 8.6*  < > 8.9 9.8  MG 2.2  --   --   --   < > = values in this interval not displayed. Liver Function Tests  Recent Labs  10/13/15 1345  AST 17  ALT 15  ALKPHOS 54  BILITOT 0.5  PROT 6.8  ALBUMIN 3.8   Hemoglobin A1C  Recent Labs  10/13/15 1346  HGBA1C 5.9*   Fasting Lipid Panel  Recent Labs  10/14/15 0431  CHOL 177  HDL 50  LDLCALC 105*  TRIG 108  CHOLHDL 3.5   Thyroid Function Tests  Recent Labs  10/13/15 1345  TSH 0.721   _____________  Dg Chest 2 View 10/13/2015  CLINICAL  DATA:  Chest pain and left arm numbness, initial encounter EXAM: CHEST  2 VIEW COMPARISON:  08/19/2006 FINDINGS: Postsurgical changes are again seen. Cardiac shadow is within normal limits. Stable aortic calcifications are seen. The lungs are well aerated bilaterally. No focal infiltrate or sizable effusion is seen. No bony abnormality is noted. IMPRESSION: No active cardiopulmonary disease. Electronically Signed   By: Alcide Clever M.D.   On: 10/13/2015 10:02   Disposition   Pt is being discharged home today in good condition.  Follow-up Plans & Appointments    Follow-up Information    Follow up with Tereso Newcomer, PA-C On 10/30/2015.   Specialties:  Physician Assistant, Radiology, Interventional Cardiology   Why:  See provider at 9:10 am, please arrive 15 minutes early for paperwork.  Contact information:   [address] [place] [id number] [phone]      Discharge Instructions    (HEART FAILURE PATIENTS) Call MD:  Anytime you have any of the following symptoms: 1) 3 pound weight gain in 24 hours or 5 pounds in 1 week 2) shortness of breath, with or without a dry hacking cough 3) swelling in the hands, feet or stomach 4) if you have to sleep on extra pillows at night in order to breathe.    Complete by:  As directed      Amb Referral to Cardiac Rehabilitation    Complete by:  As directed   Diagnosis:   Myocardial Infarction PCI       Diet - low sodium heart healthy    Complete by:  As directed      Increase activity slowly    Complete by:  As directed            Discharge Medications   Discharge Medication List as of 10/15/2015 11:31 AM    START taking these medications   Details  aspirin EC 81 MG EC tablet Take 1 tablet (81 mg total) by mouth daily., Starting 10/15/2015, Until Discontinued, OTC    atorvastatin (LIPITOR) 80 MG tablet Take 1 tablet (80 mg total) by mouth daily at 6 PM., Starting 10/15/2015, Until Discontinued, Normal      CONTINUE  these medications which have CHANGED   Details  carvedilol (COREG) 12.5 MG tablet Take 1 tablet (12.5 mg total) by mouth 2 (two) times daily with a meal., Starting 10/15/2015, Until Discontinued, Normal    furosemide (LASIX) 40 MG tablet Take 1 tablet (40 mg total) by mouth daily., Starting 10/15/2015, Until Discontinued, Normal    lisinopril (PRINIVIL,ZESTRIL) 40 MG tablet Take 1 tablet (40 mg total) by mouth daily., Starting 10/15/2015, Until Discontinued, Normal    nitroGLYCERIN (NITROSTAT) 0.4 MG SL tablet Place 1 tablet (0.4 mg total) under the tongue every 5 (five) minutes as needed for chest pain., Starting 10/15/2015, Until Discontinued, Normal    ticagrelor (BRILINTA) 90 MG TABS tablet Take 1 tablet (90 mg total) by mouth 2 (two) times daily., Starting 10/15/2015, Until Discontinued, Normal      STOP taking these medications     metoprolol tartrate (LOPRESSOR) 25 MG tablet          Aspirin prescribed at discharge?  Yes High Intensity Statin Prescribed? (Lipitor 40-80mg  or Crestor 20-40mg ): Yes Beta Blocker Prescribed? Yes For EF 45% or less, Was ACEI/ARB Prescribed? Yes ADP Receptor Inhibitor Prescribed? (i.e. Plavix etc.-Includes Medically Managed Patients): Yes For EF <40%, Aldosterone Inhibitor Prescribed? No: BP too low Was EF assessed during THIS hospitalization? Yes Was Cardiac Rehab II ordered? (Included Medically managed Patients): Yes   Outstanding Labs/Studies   2D echocardiogram  Duration of Discharge Encounter   Greater than 30 minutes including physician time.  Melida QuitterSigned, Barrett, Rhonda NP 10/15/2015, 2:37 PM

## 2015-10-15 NOTE — Progress Notes (Signed)
   Ready for DC  RTW Monday  Cancel tomorrows OV and reschedule for 1-2 weeks.

## 2015-10-15 NOTE — Care Management Note (Signed)
Case Management Note  Patient Details  Name: Candice ShortJanet Gilleland MRN: [id number] Date of Birth: Nov 03, 1960  Subjective/Objective:   Patient is for dc today, pta indep, she has PCP, does not need HH services. Will be participating in the [place] study for Brilinta.                  Action/Plan:   Expected Discharge Date:                  Expected Discharge Plan:  Home/Self Care  In-House Referral:     Discharge planning Services  CM Consult  Post Acute Care Choice:    Choice offered to:     DME Arranged:    DME Agency:     HH Arranged:    HH Agency:     Status of Service:  Completed, signed off  Medicare Important Message Given:    Date Medicare IM Given:    Medicare IM give by:    Date Additional Medicare IM Given:    Additional Medicare Important Message give by:     If discussed at Long Length of Stay Meetings, dates discussed:    Additional Comments:  Leone Havenaylor, Demetrick Eichenberger [place], RN 10/15/2015, 11:42 AM

## 2015-10-15 NOTE — Telephone Encounter (Signed)
Prior auth for Brilinta 90mg  obtained through [employer]. PA- [id number].

## 2015-10-15 NOTE — Discharge Instructions (Signed)

## 2015-10-15 NOTE — Research (Signed)
Ticagrelor 90 mg and Asa 81 mg dispensed to patient via Twilight Study. Subject verbalized understanding to take Ticagrelor 90mg  capsules twice daily, 12 hours apart. Subject verbalized to take Asa 81mg  tablets one by mouth once daily. Subject verbalized to place above medications in pillbox to decrease risk of omitting medication doses. Subject given contact information for study coordinator and verbalized understanding to call with any questions regarding Ticagrelor or Asa. Subject will expect phone call from study coordinator in one month.

## 2015-10-15 NOTE — Progress Notes (Addendum)
CARDIAC REHAB PHASE I   PRE:  Rate/Rhythm: 70 SR  BP:  Sitting: 140/54        SaO2: 100 RA  MODE:  Ambulation: 500 ft   POST:  Rate/Rhythm: 85 SR  BP:  Sitting: 157/60         SaO2: 100 RA  Pt ambulated 500 ft on RA, independent, steady gait, tolerated well.  Pt c/o mild DOE, denies cp, dizziness, declined rest stop. Completed MI/stent education.  Reviewed risk factors, anti-platelet therapy, stent card, activity restrictions, ntg, exercise, CHF booklet and zone tool, daily weights, heart healthy diet, sodium restrictions, portion control, and phase 2 cardiac rehab. Pt verbalized understanding, receptive to education. Pt agrees to phase 2 cardiac rehab referral, [facility]. Pt to bed per pt request after walk, call bell within reach.   [phone] Candice GrapesEmily C Jamai Dolce, RN, BSN 10/15/2015 8:52 AM

## 2015-10-16 ENCOUNTER — Ambulatory Visit: Payer: 59 | Admitting: Physician Assistant

## 2015-10-17 ENCOUNTER — Telehealth: Payer: Self-pay

## 2015-10-17 NOTE — Telephone Encounter (Signed)
Brilinta approved through 10/14/2016.

## 2015-10-23 ENCOUNTER — Other Ambulatory Visit: Payer: Self-pay | Admitting: Interventional Cardiology

## 2015-10-23 NOTE — Telephone Encounter (Signed)
Medication Detail      Disp Refills Start End     carvedilol (COREG) 12.5 MG tablet 60 tablet 3 10/15/2015     Sig - Route: Take 1 tablet (12.5 mg total) by mouth 2 (two) times daily with a meal. - Oral    E-Prescribing Status: Receipt confirmed by pharmacy (10/15/2015 10:00 AM EDT)     Pharmacy    [id number] - [place], [place] - [address]   Medication Detail      Disp Refills Start End     furosemide (LASIX) 40 MG tablet 30 tablet 3 10/15/2015     Sig - Route: Take 1 tablet (40 mg total) by mouth daily. - Oral    E-Prescribing Status: Receipt confirmed by pharmacy (10/15/2015 10:15 AM EDT)     Pharmacy    [id number] [place]- [place], [place] - [address]

## 2015-10-30 ENCOUNTER — Ambulatory Visit: Payer: 59 | Admitting: Physician Assistant

## 2015-11-10 ENCOUNTER — Ambulatory Visit: Payer: 59 | Admitting: Physician Assistant

## 2015-11-11 ENCOUNTER — Telehealth: Payer: Self-pay | Admitting: *Deleted

## 2015-11-11 ENCOUNTER — Encounter: Payer: Self-pay | Admitting: *Deleted

## 2015-11-11 ENCOUNTER — Telehealth (HOSPITAL_COMMUNITY): Payer: Self-pay | Admitting: Cardiac Rehabilitation

## 2015-11-11 DIAGNOSIS — Z006 Encounter for examination for normal comparison and control in clinical research program: Secondary | ICD-10-CM

## 2015-11-11 NOTE — Telephone Encounter (Signed)
TWIL[facility] Research study 1 month telephone call attempted. Left message for patient to call Research office.

## 2015-11-11 NOTE — Progress Notes (Signed)
TWILIGHT Research Study 1 month telephone follow up completed. Patient states she has been compliant with Brilinta and ASA. Denies bleeding events or adverse events. Questions encouraged and answered. Patient request we call her back to schedule 3 month randomization visit because she is at work. Her window for that visit is 12/29/15-01/26/16.

## 2015-11-11 NOTE — Telephone Encounter (Signed)
pc to pt to discuss enrolling in cardiac rehab program. Pt is interested however awaiting FMLA approval. Pt will call back when ready to schedule.

## 2015-11-20 ENCOUNTER — Telehealth: Payer: Self-pay | Admitting: Interventional Cardiology

## 2015-11-20 NOTE — Telephone Encounter (Signed)
Form is on top of Dr. Michaelle CopasSmith's cart in "to be signed" folder.

## 2015-11-20 NOTE — Telephone Encounter (Signed)
New Message  Critical Illness claim form from [employer]. It was faxed on 10/17/2015. Hoping to get a response as soon as possible

## 2015-11-25 ENCOUNTER — Ambulatory Visit (INDEPENDENT_AMBULATORY_CARE_PROVIDER_SITE_OTHER): Payer: 59 | Admitting: Cardiology

## 2015-11-25 ENCOUNTER — Encounter: Payer: Self-pay | Admitting: Cardiology

## 2015-11-25 VITALS — BP 110/60 | HR 72 | Ht 62.0 in | Wt 165.1 lb

## 2015-11-25 DIAGNOSIS — I5042 Chronic combined systolic (congestive) and diastolic (congestive) heart failure: Secondary | ICD-10-CM

## 2015-11-25 NOTE — Progress Notes (Signed)
11/25/2015 [name]   29-Oct-1960  [id number]  Primary Physician Cam Hai, CNM Primary Cardiologist: Dr. Katrinka Blazing   Reason for Visit/CC: Post hospital f/u for CAD, chronic systolic HF   HPI:  55 yo female, followed by Dr. Katrinka Blazing, with h/o CAD, prior LAD Promus DES 2003 and ultimately CABG with MV repair in 2007 for CHF related to multivessel CAD and MR.   She was recently admitted to [facility] for chest pain. Initial point care troponin was mildly elevated at 0.12. Her symptoms were concerning for angina/acute coronary syndrome. Subsequently, she was taken to the cath lab on 10/08/2015. She was found to have a widely patent LIMA graft to a relatively normal distal LAD as well as widely patent vein graft to moderate size D1. The native circumflex that was noted to be occluded is still occluded and the graft to this vessel is also occluded. The native RCA is now 100% occluded in the mid vessel with severe disease in the vein graft to the distal RCA. She underweent successful PCI of the large caliber vein graft to the RCA with drug-eluting stent. There was still some mild to moderate disease at the anastomosis. An echocardiogram was also performed which revealed moderately reduced left ventricular systolic function with an EF of 35-40%. Recommendations were made for her to continue dual antiplatelet therapy. She was enrolled in the [place] study. Her beta blocker was also switched from Lopressor to carvedilol. She was continued on lisinopril as well as Lasix.  She presents to clinic for post hospital follow-up. She reports that she has done well since discharge. She denies any recurrent chest pain. No dyspnea. She has been fully compliant with her medications and she has remained in contact with the research study team. She denies any abnormal bleeding with aspirin and Brilinta. She has been fully compliant with medications. She has not required any use of sublingual nitroglycerin. She  denies any dyspnea, lower extremity edema, weight gain and orthopnea or PND.  In clinic today, her blood pressure is well controlled at 110/60. Pulse rate is 72 bpm. She is unsure as to why her potassium was not continued at time of discharge. She has remained on 40 mg of Lasix daily.   Current Outpatient Prescriptions  Medication Sig Dispense Refill  . AMBULATORY NON FORMULARY MEDICATION Take 90 mg by mouth 2 (two) times daily. Medication Name: Brilinta 90 mg BID (TWILIGHT Research study provided)    . AMBULATORY NON FORMULARY MEDICATION Take 81 mg by mouth daily. Medication Name: ASA 81 mg daily (TWILIGHT Research study provided)    . atorvastatin (LIPITOR) 80 MG tablet Take 1 tablet (80 mg total) by mouth daily at 6 PM. 30 tablet 3  . carvedilol (COREG) 12.5 MG tablet Take 1 tablet (12.5 mg total) by mouth 2 (two) times daily with a meal. 60 tablet 3  . furosemide (LASIX) 40 MG tablet Take 1 tablet (40 mg total) by mouth daily. 30 tablet 3  . lisinopril (PRINIVIL,ZESTRIL) 40 MG tablet Take 1 tablet (40 mg total) by mouth daily. 30 tablet 1  . nitroGLYCERIN (NITROSTAT) 0.4 MG SL tablet Place 0.4 mg under the tongue every 5 (five) minutes as needed for chest pain. Up to 3 doses before calling EMS     No current facility-administered medications for this visit.    Allergies  Allergen Reactions  . Other Itching    GEL ON ELECTRODES FOR EKG    Social History   Social History  .  Marital Status: Married    Spouse Name: N/A  . Number of Children: N/A  . Years of Education: N/A   Occupational History  . Not on file.   Social History Main Topics  . Smoking status: Former Smoker -- 1.00 packs/day for 30 years    Types: Cigarettes  . Smokeless tobacco: Never Used     Comment: "quit smoking cigarettes in ~ 2007"  . Alcohol Use: 0.6 oz/week    1 Cans of beer, 0 Standard drinks or equivalent per week  . Drug Use: No  . Sexual Activity: Not Currently   Other Topics Concern  . Not on  file   Social History Narrative     Review of Systems: General: negative for chills, fever, night sweats or weight changes.  Cardiovascular: negative for chest pain, dyspnea on exertion, edema, orthopnea, palpitations, paroxysmal nocturnal dyspnea or shortness of breath Dermatological: negative for rash Respiratory: negative for cough or wheezing Urologic: negative for hematuria Abdominal: negative for nausea, vomiting, diarrhea, bright red blood per rectum, melena, or hematemesis Neurologic: negative for visual changes, syncope, or dizziness All other systems reviewed and are otherwise negative except as noted above.    Blood pressure 110/60, pulse 72, height 5\' 2"  (1.575 m), weight 165 lb 1.9 oz (74.898 kg).  General appearance: alert, cooperative and no distress Neck: no carotid bruit and no JVD Lungs: clear to auscultation bilaterally Heart: regular rate and rhythm, S1, S2 normal, no murmur, click, rub or gallop Extremities: no LEE Pulses: 2+ and symmetric Skin: warm and dry Neurologic: Grossly normal  EKG not performed.   ASSESSMENT AND PLAN:    1. CAD: s/p remote CABG. S/p recent PCI + DES to SVG-RCA. She denies any recurrent CP or dyspnea. She is a participant in the [place] Trial (ASA + Brilinta). Continue statin, BB and ACE-I.   2. Chronic Systolic HF: EF 35-40% on recent echo. BB changed from Lopressor to Coreg. She is on ACE-I therapy with lisinopril.  There is little room with BP to increase BB at this time. She is euvolemic on physical exam. No dyspnea or LEE. Continue daily lasix and low sodium diet. She is unsure why her potassium was not continued at time of discharge. We will check a BMP today to assess electrolytes. If low K, will notify patient to resume K-dur.   3. H/o Mitral Valve Disease: S/p MVR. Stable.    PLAN  F/u with Dr. Katrinka BlazingSmith in 3 months.   Robbie LisSIMMONS, Alonna Bartling PA-C 11/25/2015 3:39 PM

## 2015-11-25 NOTE — Patient Instructions (Signed)
Medication Instructions:  Your physician recommends that you continue on your current medications as directed. Please refer to the Current Medication list given to you today.   Labwork: Bmet today  Testing/Procedures: None ordered  Follow-Up: Your physician recommends that you schedule a follow-up appointment in: 3-6 months with Dr.Smith   Any Other Special Instructions Will Be Listed Below (If Applicable).     If you need a refill on your cardiac medications before your next appointment, please call your pharmacy.

## 2015-11-25 NOTE — Telephone Encounter (Signed)
Form signed by Dr.Smith and given to Selena BattenKim in MR on 5/8

## 2015-11-26 LAB — BASIC METABOLIC PANEL
BUN: 13 mg/dL (ref 7–25)
CHLORIDE: 105 mmol/L (ref 98–110)
CO2: 24 mmol/L (ref 20–31)
CREATININE: 1.12 mg/dL — AB (ref 0.50–1.05)
Calcium: 9.6 mg/dL (ref 8.6–10.4)
GLUCOSE: 88 mg/dL (ref 65–99)
Potassium: 4.1 mmol/L (ref 3.5–5.3)
Sodium: 139 mmol/L (ref 135–146)

## 2015-11-27 ENCOUNTER — Ambulatory Visit: Payer: 59 | Admitting: Physician Assistant

## 2015-12-23 ENCOUNTER — Inpatient Hospital Stay (HOSPITAL_COMMUNITY): Admission: RE | Admit: 2015-12-23 | Payer: 59 | Source: Ambulatory Visit

## 2015-12-29 ENCOUNTER — Ambulatory Visit (HOSPITAL_COMMUNITY): Payer: 59

## 2015-12-31 ENCOUNTER — Ambulatory Visit (HOSPITAL_COMMUNITY): Payer: 59

## 2016-01-02 ENCOUNTER — Ambulatory Visit (HOSPITAL_COMMUNITY): Payer: 59

## 2016-01-03 ENCOUNTER — Other Ambulatory Visit: Payer: Self-pay | Admitting: Physician Assistant

## 2016-01-05 ENCOUNTER — Ambulatory Visit (HOSPITAL_COMMUNITY): Payer: 59

## 2016-01-07 ENCOUNTER — Ambulatory Visit (HOSPITAL_COMMUNITY): Payer: 59

## 2016-01-08 ENCOUNTER — Encounter: Payer: Self-pay | Admitting: Interventional Cardiology

## 2016-01-09 ENCOUNTER — Ambulatory Visit (HOSPITAL_COMMUNITY): Payer: 59

## 2016-01-12 ENCOUNTER — Ambulatory Visit (HOSPITAL_COMMUNITY): Payer: 59

## 2016-01-14 ENCOUNTER — Ambulatory Visit (HOSPITAL_COMMUNITY)
Admission: EM | Admit: 2016-01-14 | Discharge: 2016-01-14 | Disposition: A | Discharge status: Home / Self Care | Payer: 59 | Attending: Family Medicine | Admitting: Family Medicine

## 2016-01-14 ENCOUNTER — Encounter (HOSPITAL_COMMUNITY): Payer: Self-pay

## 2016-01-14 ENCOUNTER — Ambulatory Visit (HOSPITAL_COMMUNITY): Payer: 59

## 2016-01-14 ENCOUNTER — Other Ambulatory Visit: Payer: Self-pay | Admitting: *Deleted

## 2016-01-14 ENCOUNTER — Encounter: Payer: Self-pay | Admitting: *Deleted

## 2016-01-14 DIAGNOSIS — H0014 Chalazion left upper eyelid: Secondary | ICD-10-CM

## 2016-01-14 DIAGNOSIS — Z006 Encounter for examination for normal comparison and control in clinical research program: Secondary | ICD-10-CM

## 2016-01-14 MED ORDER — AMBULATORY NON FORMULARY MEDICATION: 81.0000 mg | Freq: Every day | Status: DC

## 2016-01-14 NOTE — ED Notes (Signed)
Patient presents with itchy and swollen left eye x1 week, pt states  No injury or fall to her knowledge No acute distress

## 2016-01-14 NOTE — Progress Notes (Signed)
TWILIGHT Research Study month 3 Randomization visit completed. Patient denies bleeding events and states she has been compliant with Brilinta and ASA. Today she is RANDOMIZED to ASA 81 mg daily or PLACEBO. Instructed patient no  open label ASA. Dispensed study bottles number's # [id number]; [id number]; [id number]; [id number]. Returned 32 brilinta pills from bottle # [id number] and 8 pills from bottle # [id number]. Research required follow up visit window given to patient. Questions encouraged and answered.

## 2016-01-14 NOTE — Discharge Instructions (Signed)
Warm cloth to eye 4 times a day until seen by eye doctor.

## 2016-01-14 NOTE — ED Provider Notes (Signed)
CSN: [id number]     Arrival date & time 01/14/16  1329 History   First MD Initiated Contact with Patient 01/14/16 1401     Chief Complaint  Patient presents with  . Facial Swelling   (Consider location/radiation/quality/duration/timing/severity/associated sxs/prior Treatment) Patient is a 55 y.o. female presenting with eye problem. The history is provided by the patient.  Eye Problem Location:  L eye Quality:  Aching Severity:  Moderate Onset quality:  Gradual Duration:  1 week Progression:  Worsening Chronicity:  New Associated symptoms: swelling   Associated symptoms: no blurred vision, no decreased vision, no discharge, no double vision and no photophobia     Past Medical History  Diagnosis Date  . Hypercholesteremia   . Atrial fibrillation (HCC)   . HF (heart failure) (HCC)     with LVEF 40% 2008, Echo, functional class 2.  . Obesity   . Coronary atherosclerosis of native coronary artery   . HTN (hypertension)   . Hx of CABG     LIMA-LAD, SVG-DIAG, SVG-OM, SVG-RCA  . CHF (congestive heart failure) (HCC)   . NSTEMI (non-ST elevated myocardial infarction) (HCC) 10/13/2015  . Myocardial infarct [facility](HCC) 2007   Past Surgical History  Procedure Laterality Date  . Mitral valve repair      [name]/notes 10/13/2015  . Abdominal hysterectomy    . Cesarean section  1985  . Coronary angioplasty with stent placement  2003    LAD Promus DES [name]/notes 10/13/2015  . Coronary artery bypass graft  03/2006    "CABG X3"  . Cardiac catheterization N/A 10/14/2015    Procedure: Left Heart Cath and Cors/Grafts Angiography;  Surgeon: Marykay Lexavid W Harding, MD;  [facility];  Service: Cardiovascular;  Laterality: N/A;  . Cardiac catheterization N/A 10/14/2015    Procedure: Coronary Stent Intervention;  Surgeon: Marykay Lexavid W Harding, MD;  [facility];  Service: Cardiovascular;  Laterality: N/A;   Family History  Problem Relation Age of Onset  . Heart attack Neg Hx   . Stroke  Sister   . Hypertension Father   . Hypertension Sister   . Hypertension Brother    Social History  Substance Use Topics  . Smoking status: Former Smoker -- 1.00 packs/day for 30 years    Types: Cigarettes  . Smokeless tobacco: Never Used     Comment: "quit smoking cigarettes in ~ 2007"  . Alcohol Use: 0.6 oz/week    1 Cans of beer, 0 Standard drinks or equivalent per week     Comment: occ   OB History    No data available     Review of Systems  Constitutional: Negative.   HENT: Negative.   Eyes: Positive for pain. Negative for blurred vision, double vision, photophobia, discharge and visual disturbance.  Respiratory: Negative.   All other systems reviewed and are negative.   Allergies  Other  Home Medications   Prior to Admission medications   Medication Sig Start Date End Date Taking? Authorizing Provider  atorvastatin (LIPITOR) 80 MG tablet Take 1 tablet (80 mg total) by mouth daily at 6 PM. 10/15/15  Yes Rhonda G Barrett, PA-C  carvedilol (COREG) 12.5 MG tablet Take 1 tablet (12.5 mg total) by mouth 2 (two) times daily with a meal. 10/15/15  Yes Rhonda G Barrett, PA-C  furosemide (LASIX) 40 MG tablet Take 1 tablet (40 mg total) by mouth daily. 10/15/15  Yes Rhonda G Barrett, PA-C  lisinopril (PRINIVIL,ZESTRIL) 40 MG tablet TAKE 1 TABLET (40 MG TOTAL)  BY MOUTH DAILY. 01/05/16  Yes Lyn RecordsHenry W Smith, MD  AMBULATORY NON FORMULARY MEDICATION Take 90 mg by mouth 2 (two) times daily. Medication Name: Brilinta 90 mg BID (TWILIGHT Research study provided) 10/15/15   Kathleene Hazelhristopher D McAlhany, MD  AMBULATORY NON FORMULARY MEDICATION Take 81 mg by mouth daily. Medication Name: ASA 81 mg daily (TWILIGHT Research study provided) 10/15/15   Kathleene Hazelhristopher D McAlhany, MD  nitroGLYCERIN (NITROSTAT) 0.4 MG SL tablet Place 0.4 mg under the tongue every 5 (five) minutes as needed for chest pain. Up to 3 doses before calling EMS    Historical Provider, MD   Meds Ordered and Administered this Visit    Medications - No data to display  BP 114/74 mmHg  Pulse 80  Temp(Src) 97.5 F (36.4 C) (Oral)  Resp 16  SpO2 97% No data found.   Physical Exam  Constitutional: She appears well-developed and well-nourished.  HENT:  Right Ear: External ear normal.  Left Ear: External ear normal.  Mouth/Throat: Oropharynx is clear and moist.  Eyes: Conjunctivae and EOM are normal. Pupils are equal, round, and reactive to light. Right eye exhibits no discharge. Left eye exhibits no discharge.    Neck: Normal range of motion. Neck supple.  Nursing note and vitals reviewed.   ED Course  Procedures (including critical care time)  Labs Review Labs Reviewed - No data to display  Imaging Review No results found.   Visual Acuity Review  Right Eye Distance: 20/25 Left Eye Distance: 20/45 Bilateral Distance: 20/25  Right Eye Near:   Left Eye Near:    Bilateral Near:         MDM   1. Chalazion of left upper eyelid        Linna HoffJames D Aunisty Reali, MD 01/14/16 1419

## 2016-01-16 ENCOUNTER — Ambulatory Visit (HOSPITAL_COMMUNITY): Payer: 59

## 2016-01-19 ENCOUNTER — Ambulatory Visit (HOSPITAL_COMMUNITY): Payer: 59

## 2016-01-21 ENCOUNTER — Ambulatory Visit (HOSPITAL_COMMUNITY): Payer: 59

## 2016-01-23 ENCOUNTER — Ambulatory Visit (HOSPITAL_COMMUNITY): Payer: 59

## 2016-01-26 ENCOUNTER — Ambulatory Visit (HOSPITAL_COMMUNITY): Payer: 59

## 2016-01-28 ENCOUNTER — Ambulatory Visit (HOSPITAL_COMMUNITY): Payer: 59

## 2016-01-30 ENCOUNTER — Ambulatory Visit (HOSPITAL_COMMUNITY): Payer: 59

## 2016-02-02 ENCOUNTER — Ambulatory Visit (HOSPITAL_COMMUNITY): Payer: 59

## 2016-02-04 ENCOUNTER — Ambulatory Visit (HOSPITAL_COMMUNITY): Payer: 59

## 2016-02-06 ENCOUNTER — Ambulatory Visit (HOSPITAL_COMMUNITY): Payer: 59

## 2016-02-09 ENCOUNTER — Ambulatory Visit (HOSPITAL_COMMUNITY): Payer: 59

## 2016-02-11 ENCOUNTER — Ambulatory Visit (HOSPITAL_COMMUNITY): Payer: 59

## 2016-02-13 ENCOUNTER — Ambulatory Visit (HOSPITAL_COMMUNITY): Payer: 59

## 2016-02-13 ENCOUNTER — Telehealth: Payer: Self-pay | Admitting: *Deleted

## 2016-02-13 NOTE — Telephone Encounter (Signed)
Left message for patient to call research office for her TWILIGHT research study 4 month follow up call.

## 2016-02-16 ENCOUNTER — Ambulatory Visit (HOSPITAL_COMMUNITY): Payer: 59

## 2016-02-18 ENCOUNTER — Encounter: Payer: Self-pay | Admitting: *Deleted

## 2016-02-18 ENCOUNTER — Ambulatory Visit (HOSPITAL_COMMUNITY): Payer: 59

## 2016-02-18 DIAGNOSIS — Z006 Encounter for examination for normal comparison and control in clinical research program: Secondary | ICD-10-CM

## 2016-02-18 NOTE — Progress Notes (Signed)
Patient returned call on 02/13/16. TWILIGHT Research study 4 month telephone visit completed. Patient denies any adverse or bleeding events. She states that she has continued to be compliant with medication. Questions encouraged and answered. Next research visit will be at the end of the year.Research office will call to schedule.

## 2016-02-20 ENCOUNTER — Ambulatory Visit (HOSPITAL_COMMUNITY): Payer: 59

## 2016-02-23 ENCOUNTER — Ambulatory Visit (HOSPITAL_COMMUNITY): Payer: 59

## 2016-02-25 ENCOUNTER — Other Ambulatory Visit: Payer: Self-pay | Admitting: Physician Assistant

## 2016-02-25 ENCOUNTER — Other Ambulatory Visit: Payer: Self-pay

## 2016-02-25 ENCOUNTER — Ambulatory Visit (HOSPITAL_COMMUNITY): Payer: 59

## 2016-02-25 MED ORDER — CARVEDILOL 12.5 MG PO TABS: 12.5000 mg | ORAL_TABLET | Freq: Two times a day (BID) | ORAL | 11 refills | Status: DC

## 2016-02-27 ENCOUNTER — Ambulatory Visit (HOSPITAL_COMMUNITY): Payer: 59

## 2016-03-01 ENCOUNTER — Ambulatory Visit: Payer: 59 | Admitting: Interventional Cardiology

## 2016-03-01 ENCOUNTER — Ambulatory Visit (HOSPITAL_COMMUNITY): Payer: 59

## 2016-03-03 ENCOUNTER — Ambulatory Visit (HOSPITAL_COMMUNITY): Payer: 59

## 2016-03-05 ENCOUNTER — Ambulatory Visit (HOSPITAL_COMMUNITY): Payer: 59

## 2016-03-08 ENCOUNTER — Ambulatory Visit (HOSPITAL_COMMUNITY): Payer: 59

## 2016-03-10 ENCOUNTER — Ambulatory Visit (HOSPITAL_COMMUNITY): Payer: 59

## 2016-03-12 ENCOUNTER — Ambulatory Visit (HOSPITAL_COMMUNITY): Payer: 59

## 2016-03-15 ENCOUNTER — Ambulatory Visit (HOSPITAL_COMMUNITY): Payer: 59

## 2016-03-17 ENCOUNTER — Ambulatory Visit (HOSPITAL_COMMUNITY): Payer: 59

## 2016-03-19 ENCOUNTER — Ambulatory Visit (HOSPITAL_COMMUNITY): Payer: 59

## 2016-03-24 ENCOUNTER — Ambulatory Visit (HOSPITAL_COMMUNITY): Payer: 59

## 2016-03-26 ENCOUNTER — Ambulatory Visit (HOSPITAL_COMMUNITY): Payer: 59

## 2016-03-29 ENCOUNTER — Ambulatory Visit (HOSPITAL_COMMUNITY): Payer: 59

## 2016-03-31 ENCOUNTER — Ambulatory Visit (HOSPITAL_COMMUNITY): Payer: 59

## 2016-04-02 ENCOUNTER — Ambulatory Visit (HOSPITAL_COMMUNITY): Payer: 59

## 2016-04-07 ENCOUNTER — Other Ambulatory Visit: Payer: Self-pay | Admitting: Physician Assistant

## 2016-04-08 ENCOUNTER — Ambulatory Visit: Payer: 59 | Admitting: Interventional Cardiology

## 2016-04-09 ENCOUNTER — Telehealth: Payer: Self-pay | Admitting: Interventional Cardiology

## 2016-04-09 NOTE — Telephone Encounter (Signed)
Patient never picked up Critical Illness Claim form dated back to 12/01/15- I have mailed to patient home address.

## 2016-04-20 ENCOUNTER — Encounter: Payer: Self-pay | Admitting: Interventional Cardiology

## 2016-05-04 ENCOUNTER — Encounter: Payer: Self-pay | Admitting: Interventional Cardiology

## 2016-05-04 ENCOUNTER — Ambulatory Visit (INDEPENDENT_AMBULATORY_CARE_PROVIDER_SITE_OTHER): Payer: 59 | Admitting: Interventional Cardiology

## 2016-05-04 VITALS — BP 94/58 | HR 66 | Ht 62.0 in | Wt 168.0 lb

## 2016-05-04 DIAGNOSIS — I480 Paroxysmal atrial fibrillation: Secondary | ICD-10-CM | POA: Diagnosis not present

## 2016-05-04 DIAGNOSIS — I2510 Atherosclerotic heart disease of native coronary artery without angina pectoris: Secondary | ICD-10-CM

## 2016-05-04 DIAGNOSIS — E7800 Pure hypercholesterolemia, unspecified: Secondary | ICD-10-CM

## 2016-05-04 DIAGNOSIS — I10 Essential (primary) hypertension: Secondary | ICD-10-CM

## 2016-05-04 DIAGNOSIS — I5042 Chronic combined systolic (congestive) and diastolic (congestive) heart failure: Secondary | ICD-10-CM | POA: Diagnosis not present

## 2016-05-04 LAB — BASIC METABOLIC PANEL
BUN: 12 mg/dL (ref 7–25)
CALCIUM: 9.3 mg/dL (ref 8.6–10.4)
CHLORIDE: 103 mmol/L (ref 98–110)
CO2: 25 mmol/L (ref 20–31)
CREATININE: 0.99 mg/dL (ref 0.50–1.05)
GLUCOSE: 107 mg/dL — AB (ref 65–99)
Potassium: 4 mmol/L (ref 3.5–5.3)
Sodium: 137 mmol/L (ref 135–146)

## 2016-05-04 NOTE — Progress Notes (Signed)
Cardiology Office Note    Date:  05/04/2016   ID:  [name], DOB 08/31/1960, MRN [id number]  PCP:  Cam Hai, CNM  Cardiologist: Lesleigh Noe, MD   Chief Complaint  Patient presents with  . Congestive Heart Failure    History of Present Illness:  [name] is a 55 y.o. 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 SVG to RCA DES 09/2015. Most recent LVEF 35-40%.  She is on the Twilight study. She denies angina. Most recent cardiac intervention was DES to the SVG to RCA. She has noted some easy bruising on her current medical regimen especially after PAXELD was started. She denies palpitations. No peripheral edema. No episodes of syncope. Generally feels well.   Past Medical History:  Diagnosis Date  . Atrial fibrillation (HCC)   . CHF (congestive heart failure) (HCC)   . Coronary atherosclerosis of native coronary artery   . HF (heart failure) (HCC)    with LVEF 40% 2008, Echo, functional class 2.  . HTN (hypertension)   . Hx of CABG    LIMA-LAD, SVG-DIAG, SVG-OM, SVG-RCA  . Hypercholesteremia   . Myocardial infarct 2007  . NSTEMI (non-ST elevated myocardial infarction) (HCC) 10/13/2015  . Obesity     Past Surgical History:  Procedure Laterality Date  . ABDOMINAL HYSTERECTOMY    . CARDIAC CATHETERIZATION N/A 10/14/2015   Procedure: Left Heart Cath and Cors/Grafts Angiography;  Surgeon: Marykay Lex, MD;  [facility] CV [facility];  Service: Cardiovascular;  Laterality: N/A;  . CARDIAC CATHETERIZATION N/A 10/14/2015   Procedure: Coronary Stent Intervention;  Surgeon: Marykay Lex, MD;  Location: [facility] INVASIVE CV LAB;  Service: Cardiovascular;  Laterality: N/A;  . CESAREAN SECTION  1985  . CORONARY ANGIOPLASTY WITH STENT PLACEMENT  2003   LAD Promus DES [name] 10/13/2015  . CORONARY ARTERY BYPASS GRAFT  03/2006   "CABG X3"  . MITRAL VALVE REPAIR     [name]  10/13/2015    Current Medications: Outpatient Medications Prior to Visit  Medication Sig Dispense Refill  . AMBULATORY NON FORMULARY MEDICATION Take 90 mg by mouth 2 (two) times daily. Medication Name: Brilinta 90 mg BID (TWILIGHT Research study provided)    . AMBULATORY NON FORMULARY MEDICATION Take 81 mg by mouth daily. Medication Name: Aspirin 81 mg or PLACEBO (Randomized 01/14/16 TWILIGHT Research study)    . atorvastatin (LIPITOR) 80 MG tablet TAKE 1 TABLET (80 MG TOTAL) BY MOUTH DAILY AT 6 PM. 30 tablet 8  . carvedilol (COREG) 12.5 MG tablet Take 1 tablet (12.5 mg total) by mouth 2 (two) times daily with a meal. 60 tablet 11  . furosemide (LASIX) 40 MG tablet TAKE 1 TABLET (40 MG TOTAL) BY MOUTH DAILY. 30 tablet 11  . lisinopril (PRINIVIL,ZESTRIL) 40 MG tablet TAKE 1 TABLET (40 MG TOTAL) BY MOUTH DAILY. 30 tablet 10  . nitroGLYCERIN (NITROSTAT) 0.4 MG SL tablet Place 0.4 mg under the tongue every 5 (five) minutes as needed for chest pain. Up to 3 doses before calling EMS     No facility-administered medications prior to visit.      Allergies:   Other   Social History   Social History  . Marital status: Married    Spouse name: N/A  . Number of children: N/A  . Years of education: N/A   Social History Main Topics  . Smoking status: Former  Smoker    Packs/day: 1.00    Years: 30.00    Types: Cigarettes  . Smokeless tobacco: Never Used     Comment: "quit smoking cigarettes in ~ 2007"  . Alcohol use 0.6 oz/week    1 Cans of beer per week     Comment: occ  . Drug use: No  . Sexual activity: Not Currently   Other Topics Concern  . Not on file   Social History Narrative  . No narrative on file     Family History:  The patient's family history includes Hypertension in her brother, father, and sister; Stroke in her sister.   ROS:   Please see the history of present illness.    Easy bruising.  All other systems reviewed and are negative.   PHYSICAL EXAM:   VS:  BP (!)  94/58 (BP Location: Left Arm, Patient Position: Sitting, Cuff Size: Normal)   Pulse 66   Ht 5\' 2"  (1.575 m)   Wt 168 lb (76.2 kg)   BMI 30.73 kg/m    GEN: Well nourished, well developed, in no acute distress  HEENT: normal  Neck: no JVD, carotid bruits, or masses Cardiac: RRR; no murmurs, rubs, or gallops,no edema  Respiratory:  clear to auscultation bilaterally, normal work of breathing GI: soft, nontender, nondistended, + BS MS: no deformity or atrophy  Skin: warm and dry, no rash Neuro:  Alert and Oriented x 3, Strength and sensation are intact Psych: euthymic mood, full affect  Wt Readings from Last 3 Encounters:  05/04/16 168 lb (76.2 kg)  11/25/15 165 lb 1.9 oz (74.9 kg)  10/15/15 169 lb 15.6 oz (77.1 kg)      Studies/Labs Reviewed:   EKG:  EKG  None  Recent Labs: 10/13/2015: ALT 15; B Natriuretic Peptide 38.8; Magnesium 2.2; TSH 0.721 10/15/2015: Hemoglobin 12.7; Platelets 192 11/25/2015: BUN 13; Creat 1.12; Potassium 4.1; Sodium 139   Lipid Panel    Component Value Date/Time   CHOL 177 10/14/2015 0431   TRIG 108 10/14/2015 0431   HDL 50 10/14/2015 0431   CHOLHDL 3.5 10/14/2015 0431   VLDL 22 10/14/2015 0431   LDLCALC 105 (H) 10/14/2015 0431    Additional studies/ records that were reviewed today include:  None    ASSESSMENT:    1. Atherosclerosis of native coronary artery of native heart without angina pectoris   2. Chronic combined systolic and diastolic HF (heart failure), NYHA class 2 (HCC)   3. Paroxysmal atrial fibrillation (HCC)   4. Hypercholesteremia   5. Essential hypertension      PLAN:  In order of problems listed above:  1. Continue Twilight. Return for follow-up in March. At that time antiplatelet therapy will be simplified probably back to a baby aspirin daily. 2. No evidence of volume overload. We will recheck renal function and electrolytes on the current medical regimen. 3. Denies palpitations or any clinical recurrence of atrial  fibrillation. 4. Not addressed 5. Very well controlled    Medication Adjustments/Labs and Tests Ordered: Current medicines are reviewed at length with the patient today.  Concerns regarding medicines are outlined above.  Medication changes, Labs and Tests ordered today are listed in the Patient Instructions below. There are no Patient Instructions on file for this visit.   Signed, Lesleigh NoeHenry W Catharina Pica III, MD  05/04/2016 8:59 AM    [facility] [address], [place], [place]  [address] Phone: [phone]; Fax: [phone]

## 2016-05-04 NOTE — Patient Instructions (Signed)
Medication Instructions:  Your physician recommends that you continue on your current medications as directed. Please refer to the Current Medication list given to you today.   Labwork: Lab work to be done today--BMP  Testing/Procedures: none  Follow-Up: Your physician recommends that you schedule a follow-up appointment in: March 2018    Any Other Special Instructions Will Be Listed Below (If Applicable).     If you need a refill on your cardiac medications before your next appointment, please call your pharmacy.

## 2016-07-16 ENCOUNTER — Other Ambulatory Visit: Payer: Self-pay | Admitting: Interventional Cardiology

## 2016-08-09 ENCOUNTER — Encounter: Payer: Self-pay | Admitting: *Deleted

## 2016-08-09 DIAGNOSIS — Z006 Encounter for examination for normal comparison and control in clinical research program: Secondary | ICD-10-CM

## 2016-08-09 NOTE — Progress Notes (Signed)
TWILIGHT Research study month 9 office visit completed. Patient denies any bleeding or other adverse events. She has been 96% compliant with ASA/Placebo and Brilinta after pill count. Dispensed the following bottle #'s: [id number]; [id number]; [id number]; [id number]; [id number]. Next research required visit is due no later than 21/JUL/2018. At that visit she will no longer be provided Brilinta. Further antiplatelet therapy will be at the discretion of her cardiologist. Questions encouraged and answered.

## 2016-09-09 ENCOUNTER — Encounter: Payer: Self-pay | Admitting: Interventional Cardiology

## 2016-09-20 ENCOUNTER — Ambulatory Visit: Payer: 59 | Admitting: Interventional Cardiology

## 2017-01-21 ENCOUNTER — Telehealth: Payer: Self-pay | Admitting: *Deleted

## 2017-01-21 NOTE — Telephone Encounter (Signed)
Left message to call back  

## 2017-01-21 NOTE — Telephone Encounter (Signed)
-----   Message from Lyn RecordsHenry W Smith, MD sent at 01/20/2017 12:26 PM EDT ----- Regarding: RE: End of TWILIGHT treatment Plan to start aspirin and plavix ----- Message ----- From: Orrin BrighamHedrick, Tammy W, RN Sent: 01/20/2017  11:23 AM To: Lyn RecordsHenry W Smith, MD, Julio SicksJennifer L Ikechukwu Cerny, LPN Subject: End of TWILIGHT treatment                      Dr. Katrinka BlazingSmith,     Mrs. Candice MaskerMorris has her last research appointment 01/25/17. At this visit she will no longer receive ASA/placebo or Brilinta.   Further antiplatelet therapy will be at your discretion (Includling ASA).   A script will need to be sent to her pharmacy.   Thanks McK[employer]ammy

## 2017-01-25 ENCOUNTER — Other Ambulatory Visit: Payer: Self-pay | Admitting: *Deleted

## 2017-01-25 ENCOUNTER — Encounter: Payer: Self-pay | Admitting: *Deleted

## 2017-01-25 DIAGNOSIS — Z006 Encounter for examination for normal comparison and control in clinical research program: Secondary | ICD-10-CM

## 2017-01-25 MED ORDER — CLOPIDOGREL BISULFATE 75 MG PO TABS: 75.0000 mg | ORAL_TABLET | Freq: Every day | ORAL | 3 refills | Status: DC

## 2017-01-25 MED ORDER — ASPIRIN EC 81 MG PO TBEC: 81.0000 mg | DELAYED_RELEASE_TABLET | Freq: Every day | ORAL | 3 refills | Status: AC

## 2017-01-25 NOTE — Telephone Encounter (Signed)
Left message to call back  

## 2017-01-25 NOTE — Progress Notes (Signed)
TWILIGHT Research study month 15 follow up visit completed. Patient denies any bleeding or other adverse events. She has been 100 % compliant with ASA/Placebo and 98% compliant with Brilinta. She has been prescribed Plavix 75 mg and ASA 81 mg daily but this has not been called to pharmacy as of today. I gave her a few Brilinta until Plavix has been sent to pharmacy and picked up by patient. Patient verbalized understanding to Stop Brilinta and start Plavix and ASA (and Not to take both Brilinta and Plavix). Questions encouraged and answered.

## 2017-01-27 NOTE — Telephone Encounter (Signed)
Left message to call back  

## 2017-01-28 ENCOUNTER — Other Ambulatory Visit: Payer: Self-pay | Admitting: Interventional Cardiology

## 2017-02-02 NOTE — Telephone Encounter (Signed)
Left message to call back  

## 2017-02-04 NOTE — Telephone Encounter (Signed)
Attempted to contact pt.  Left VM to call back.  Attempted husband's number as well.  Called Rx and they said pt has not picked up Plavix as of yet.

## 2017-02-09 NOTE — Telephone Encounter (Signed)
Left message to call back  

## 2017-02-10 ENCOUNTER — Other Ambulatory Visit: Payer: Self-pay | Admitting: Interventional Cardiology

## 2017-02-11 ENCOUNTER — Other Ambulatory Visit: Payer: Self-pay | Admitting: Interventional Cardiology

## 2017-02-24 NOTE — Telephone Encounter (Signed)
Left message to call back  

## 2017-02-28 NOTE — Telephone Encounter (Signed)
Left message to call back  

## 2017-03-04 NOTE — Telephone Encounter (Signed)
Left message to call back  Per Research Encounter note on 01/25/17.  Pt was educated on stopping Brilinta and Starting ASA/Plavix and verbalized understanding.

## 2017-03-11 ENCOUNTER — Other Ambulatory Visit: Payer: Self-pay | Admitting: Physician Assistant

## 2017-03-23 ENCOUNTER — Telehealth: Payer: Self-pay | Admitting: *Deleted

## 2017-03-30 ENCOUNTER — Telehealth: Payer: Self-pay | Admitting: *Deleted

## 2017-03-30 ENCOUNTER — Encounter: Payer: Self-pay | Admitting: *Deleted

## 2017-03-30 DIAGNOSIS — Z006 Encounter for examination for normal comparison and control in clinical research program: Secondary | ICD-10-CM

## 2017-03-30 NOTE — Telephone Encounter (Signed)
Left message for patient to call the research office for the TWILIGHT research study last telephone follow up.

## 2017-03-30 NOTE — Progress Notes (Signed)
TWILIGHT Research study 18 month follow up telephone call follow up completed. Patient with no complaints continues Plavix and asa

## 2017-04-04 NOTE — Telephone Encounter (Signed)
Phone call completed by Tammy.

## 2017-04-05 ENCOUNTER — Ambulatory Visit: Payer: 59 | Admitting: Interventional Cardiology

## 2017-04-08 ENCOUNTER — Other Ambulatory Visit: Payer: Self-pay | Admitting: Physician Assistant

## 2017-04-08 ENCOUNTER — Other Ambulatory Visit: Payer: Self-pay | Admitting: Interventional Cardiology

## 2017-05-12 ENCOUNTER — Other Ambulatory Visit: Payer: Self-pay | Admitting: Interventional Cardiology

## 2017-05-17 ENCOUNTER — Ambulatory Visit: Payer: 59 | Admitting: Interventional Cardiology

## 2017-06-15 ENCOUNTER — Other Ambulatory Visit: Payer: Self-pay | Admitting: Interventional Cardiology

## 2017-07-28 ENCOUNTER — Ambulatory Visit: Payer: 59 | Admitting: Interventional Cardiology

## 2017-08-24 ENCOUNTER — Ambulatory Visit (INDEPENDENT_AMBULATORY_CARE_PROVIDER_SITE_OTHER): Payer: 59 | Admitting: Interventional Cardiology

## 2017-08-24 ENCOUNTER — Encounter: Payer: Self-pay | Admitting: Interventional Cardiology

## 2017-08-24 VITALS — BP 114/76 | HR 74 | Ht 62.0 in | Wt 163.4 lb

## 2017-08-24 DIAGNOSIS — I739 Peripheral vascular disease, unspecified: Secondary | ICD-10-CM

## 2017-08-24 DIAGNOSIS — I5042 Chronic combined systolic (congestive) and diastolic (congestive) heart failure: Secondary | ICD-10-CM

## 2017-08-24 DIAGNOSIS — I10 Essential (primary) hypertension: Secondary | ICD-10-CM

## 2017-08-24 DIAGNOSIS — I480 Paroxysmal atrial fibrillation: Secondary | ICD-10-CM

## 2017-08-24 DIAGNOSIS — I25709 Atherosclerosis of coronary artery bypass graft(s), unspecified, with unspecified angina pectoris: Secondary | ICD-10-CM

## 2017-08-24 DIAGNOSIS — E7800 Pure hypercholesterolemia, unspecified: Secondary | ICD-10-CM | POA: Diagnosis not present

## 2017-08-24 NOTE — Patient Instructions (Signed)
Medication Instructions:  Your physician recommends that you continue on your current medications as directed. Please refer to the Current Medication list given to you today.  Labwork: CMET and Lipids  Testing/Procedures: Your physician has requested that you have a lower extremity arterial duplex with ABIs. This test is an ultrasound of the arteries in the legs. It looks at arterial blood flow in the legs. Allow one hour for Lower Arterial scans. There are no restrictions or special instructions  Follow-Up: Your physician wants you to follow-up in: 1 year with Dr. Katrinka BlazingSmith.  You will receive a reminder letter in the mail two months in advance. If you don't receive a letter, please call our office to schedule the follow-up appointment.   Any Other Special Instructions Will Be Listed Below (If Applicable).     If you need a refill on your cardiac medications before your next appointment, please call your pharmacy.

## 2017-08-24 NOTE — Progress Notes (Signed)
Cardiology Office Note    Date:  08/24/2017   ID:  [name], DOB [birth year], MRN [id number]  PCP:  Arabella Merles, CNM  Cardiologist: Lesleigh Noe, MD   Chief Complaint  Patient presents with  . Coronary Artery Disease  . Cardiac Valve Problem    History of Present Illness:  Lyrik Dockstader is a 57 y.o. 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 SVG to RCA DES 09/2015. Most recent LVEF 35-40%.   She feels well.  She has occasional tightness in the chest on the emotional stress.  Otherwise no complaints.  She denies orthopnea and PND.  She has not had syncope or prolonged palpitations.  No transient neurological symptoms.  No blood in the urine or stool.   Past Medical History:  Diagnosis Date  . Atrial fibrillation (HCC)   . CHF (congestive heart failure) (HCC)   . Coronary atherosclerosis of native coronary artery   . HF (heart failure) (HCC)    with LVEF 40% 2008, Echo, functional class 2.  . HTN (hypertension)   . Hx of CABG    LIMA-LAD, SVG-DIAG, SVG-OM, SVG-RCA  . Hypercholesteremia   . Myocardial infarct (HCC) 2007  . NSTEMI (non-ST elevated myocardial infarction) (HCC) 10/13/2015  . Obesity     Past Surgical History:  Procedure Laterality Date  . ABDOMINAL HYSTERECTOMY    . CARDIAC CATHETERIZATION N/A 10/14/2015   Procedure: Left Heart Cath and Cors/Grafts Angiography;  Surgeon: Marykay Lex, MD;  Location: [facility] INVASIVE CV LAB;  Service: Cardiovascular;  Laterality: N/A;  . CARDIAC CATHETERIZATION N/A 10/14/2015   Procedure: Coronary Stent Intervention;  Surgeon: Marykay Lex, MD;  Location: [facility] INVASIVE CV LAB;  Service: Cardiovascular;  Laterality: N/A;  . CESAREAN SECTION  1985  . CORONARY ANGIOPLASTY WITH STENT PLACEMENT  2003   LAD Promus DES [name] 10/13/2015  . CORONARY ARTERY BYPASS GRAFT  03/2006   "CABG X3"  . MITRAL VALVE REPAIR     [name] 10/13/2015     Current Medications: Outpatient Medications Prior to Visit  Medication Sig Dispense Refill  . aspirin EC 81 MG tablet Take 1 tablet (81 mg total) by mouth daily. 90 tablet 3  . atorvastatin (LIPITOR) 80 MG tablet TAKE 1 TABLET (80 MG TOTAL) BY MOUTH DAILY AT 6 PM. 30 tablet 8  . carvedilol (COREG) 12.5 MG tablet TAKE 1 TABLET (12.5 MG TOTAL) BY MOUTH 2 (TWO) TIMES DAILY WITH A MEAL. 60 tablet 1  . clopidogrel (PLAVIX) 75 MG tablet Take 1 tablet (75 mg total) by mouth daily. 90 tablet 3  . furosemide (LASIX) 40 MG tablet TAKE 1 TABLET BY MOUTH DAILY 30 tablet 1  . lisinopril (PRINIVIL,ZESTRIL) 40 MG tablet PLEASE SEE ATTACHED FOR DETAILED DIRECTIONS 30 tablet 1  . nitroGLYCERIN (NITROSTAT) 0.4 MG SL tablet Place 0.4 mg under the tongue every 5 (five) minutes as needed for chest pain. Up to 3 doses before calling EMS     No facility-administered medications prior to visit.      Allergies:   Other   Social History   Socioeconomic History  . Marital status: Married    Spouse name: None  . Number of children: None  . Years of education: None  . Highest education level: None  Social Needs  . Financial resource strain: None  . Food insecurity - worry: None  . Food insecurity -  inability: None  . Transportation needs - medical: None  . Transportation needs - non-medical: None  Occupational History  . None  Tobacco Use  . Smoking status: Former Smoker    Packs/day: 1.00    Years: 30.00    Pack years: 30.00    Types: Cigarettes  . Smokeless tobacco: Never Used  . Tobacco comment: "quit smoking cigarettes in ~ 2007"  Substance and Sexual Activity  . Alcohol use: Yes    Alcohol/week: 0.6 oz    Types: 1 Cans of beer per week    Comment: occ  . Drug use: No  . Sexual activity: Not Currently  Other Topics Concern  . None  Social History Narrative  . None     Family History:  The patient's family history includes Hypertension in her brother, father, and sister; Stroke in  her sister.   ROS:   Please see the history of present illness.    Continues to smoke cigarettes.  Her job is very stressful.  She is looking to change.  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 All other systems reviewed and are negative.   PHYSICAL EXAM:   VS:  BP 114/76   Pulse 74   Ht 5\' 2"  (1.575 m)   Wt 163 lb 6.4 oz (74.1 kg)   SpO2 99%   BMI 29.89 kg/m    GEN: Well nourished, well developed, in no acute distress  HEENT: normal  Neck: no JVD, carotid bruits, or masses Cardiac: RRR; no murmurs, rubs, or gallops,no edema.  Diminished pulses in the popliteal and posterior tibial bilaterally.  No femoral bruits are heard. Respiratory:  clear to auscultation bilaterally, normal work of breathing GI: soft, nontender, nondistended, + BS MS: no deformity or atrophy  Skin: warm and dry, no rash Neuro:  Alert and Oriented x 3, Strength and sensation are intact Psych: euthymic mood, full affect  Wt Readings from Last 3 Encounters:  08/24/17 163 lb 6.4 oz (74.1 kg)  05/04/16 168 lb (76.2 kg)  11/25/15 165 lb 1.9 oz (74.9 kg)      Studies/Labs Reviewed:   EKG:  EKG normal sinus rhythm, precordial T wave inversion V3 through V6.  Possible old inferior infarct.  When compared to 2017, T wave abnormality is less severe and is no longer present in the inferior leads.  Recent Labs: No results found for requested labs within last 8760 hours.   Lipid Panel    Component Value Date/Time   CHOL 177 10/14/2015 0431   TRIG 108 10/14/2015 0431   HDL 50 10/14/2015 0431   CHOLHDL 3.5 10/14/2015 0431   VLDL 22 10/14/2015 0431   LDLCALC 105 (H) 10/14/2015 0431    Additional studies/ records that were reviewed today include:  2D Doppler echocardiogram March 2017: Study Conclusions   - Left ventricle: Infeior wall hypokinesis Wall thickness was   increased in a pattern of mild LVH. Systolic function was   moderately reduced. The estimated ejection fraction was in  the   range of 35% to 40%. - Aortic valve: There was mild regurgitation. - Mitral valve: S/p mitral valve repair with no residual MR. Valve   area by pressure half-time: 2.08 cm^2. - Left atrium: The atrium was mildly dilated. - Atrial septum: No defect or patent foramen ovale was identified.    ASSESSMENT:    1. Paroxysmal atrial fibrillation (HCC)   2. Chronic combined systolic and diastolic HF (heart failure), NYHA class 2 (HCC)   3.  Coronary artery disease involving coronary bypass graft of native heart with angina pectoris (HCC)   4. Essential hypertension   5. Hypercholesteremia   6. Intermittent claudication (HCC)      PLAN:  In order of problems listed above:  1. Normal sinus rhythm present on today's EKG. 2. No evidence of volume overload.  Last LVEF 35% 2017. 3. Stable angina pectoris, stress related. 4. Target blood pressure 130/85 mmHg or less. 5. LDL target is less than 70.  Lipid panel was obtained today.  She is not fasting.  Last lipid panel was done 2 years ago. 6. Right calf claudication.  Diminished/absent right lower extremity pedal pulses.  Absent bilateral popliteal pulses.  Plan lower extremity ABI/duplex to investigate claudication.  Advised to stop smoking.  Clinical follow-up in 1 year.  Will get lipid, blood sugar, and liver panel today.  Medication Adjustments/Labs and Tests Ordered: Current medicines are reviewed at length with the patient today.  Concerns regarding medicines are outlined above.  Medication changes, Labs and Tests ordered today are listed in the Patient Instructions below. Patient Instructions  Medication Instructions:  Your physician recommends that you continue on your current medications as directed. Please refer to the Current Medication list given to you today.  Labwork: CMET and Lipids  Testing/Procedures: Your physician has requested that you have a lower extremity arterial duplex with ABIs. This test is an ultrasound of  the arteries in the legs. It looks at arterial blood flow in the legs. Allow one hour for Lower Arterial scans. There are no restrictions or special instructions  Follow-Up: Your physician wants you to follow-up in: 1 year with Dr. Katrinka Blazing.  You will receive a reminder letter in the mail two months in advance. If you don't receive a letter, please call our office to schedule the follow-up appointment.   Any Other Special Instructions Will Be Listed Below (If Applicable).     If you need a refill on your cardiac medications before your next appointment, please call your pharmacy.      Signed, Lesleigh Noe, MD  08/24/2017 3:25 PM    [facility] [address] [place], [place], [place]  [address] Phone: [phone]; Fax: [phone]

## 2017-08-25 LAB — COMPREHENSIVE METABOLIC PANEL
ALT: 17 IU/L (ref 0–32)
AST: 16 IU/L (ref 0–40)
Albumin/Globulin Ratio: 1.3 (ref 1.2–2.2)
Albumin: 4.3 g/dL (ref 3.5–5.5)
Alkaline Phosphatase: 77 IU/L (ref 39–117)
BILIRUBIN TOTAL: 0.4 mg/dL (ref 0.0–1.2)
BUN/Creatinine Ratio: 9 (ref 9–23)
BUN: 8 mg/dL (ref 6–24)
CHLORIDE: 102 mmol/L (ref 96–106)
CO2: 27 mmol/L (ref 20–29)
Calcium: 9.7 mg/dL (ref 8.7–10.2)
Creatinine, Ser: 0.92 mg/dL (ref 0.57–1.00)
GFR calc Af Amer: 79 mL/min/{1.73_m2} (ref >59)
GFR calc non Af Amer: 69 mL/min/{1.73_m2} (ref >59)
Globulin, Total: 3.4 g/dL (ref 1.5–4.5)
Glucose: 88 mg/dL (ref 65–99)
Potassium: 4.2 mmol/L (ref 3.5–5.2)
Sodium: 148 mmol/L — ABNORMAL HIGH (ref 134–144)
Total Protein: 7.7 g/dL (ref 6.0–8.5)

## 2017-08-25 LAB — LIPID PANEL
Chol/HDL Ratio: 3.3 ratio (ref 0.0–4.4)
Cholesterol, Total: 191 mg/dL (ref 100–199)
HDL: 58 mg/dL (ref >39)
LDL Calculated: 96 mg/dL (ref 0–99)
TRIGLYCERIDES: 184 mg/dL — AB (ref 0–149)
VLDL Cholesterol Cal: 37 mg/dL (ref 5–40)

## 2017-08-27 ENCOUNTER — Other Ambulatory Visit: Payer: Self-pay | Admitting: Interventional Cardiology

## 2017-09-05 ENCOUNTER — Other Ambulatory Visit: Payer: Self-pay | Admitting: Physician Assistant

## 2017-09-14 ENCOUNTER — Encounter (HOSPITAL_COMMUNITY): Payer: 59

## 2017-10-20 ENCOUNTER — Other Ambulatory Visit: Payer: Self-pay | Admitting: Interventional Cardiology

## 2017-10-20 ENCOUNTER — Encounter (HOSPITAL_COMMUNITY): Payer: 59

## 2017-12-04 ENCOUNTER — Emergency Department (HOSPITAL_COMMUNITY): Payer: 59

## 2017-12-04 ENCOUNTER — Other Ambulatory Visit: Payer: Self-pay

## 2017-12-04 ENCOUNTER — Inpatient Hospital Stay (HOSPITAL_COMMUNITY)
Admission: EM | Admit: 2017-12-04 | Discharge: 2017-12-13 | DRG: 280 | Disposition: A | Discharge status: Home / Self Care | Payer: 59 | Attending: Cardiology | Admitting: Cardiology

## 2017-12-04 ENCOUNTER — Encounter (HOSPITAL_COMMUNITY): Payer: Self-pay | Admitting: Emergency Medicine

## 2017-12-04 DIAGNOSIS — Z8249 Family history of ischemic heart disease and other diseases of the circulatory system: Secondary | ICD-10-CM | POA: Diagnosis not present

## 2017-12-04 DIAGNOSIS — Z87891 Personal history of nicotine dependence: Secondary | ICD-10-CM | POA: Diagnosis not present

## 2017-12-04 DIAGNOSIS — I272 Pulmonary hypertension, unspecified: Secondary | ICD-10-CM | POA: Diagnosis present

## 2017-12-04 DIAGNOSIS — I483 Typical atrial flutter: Secondary | ICD-10-CM | POA: Diagnosis present

## 2017-12-04 DIAGNOSIS — I513 Intracardiac thrombosis, not elsewhere classified: Secondary | ICD-10-CM | POA: Diagnosis present

## 2017-12-04 DIAGNOSIS — I110 Hypertensive heart disease with heart failure: Secondary | ICD-10-CM | POA: Diagnosis present

## 2017-12-04 DIAGNOSIS — Z90710 Acquired absence of both cervix and uterus: Secondary | ICD-10-CM

## 2017-12-04 DIAGNOSIS — I25110 Atherosclerotic heart disease of native coronary artery with unstable angina pectoris: Secondary | ICD-10-CM | POA: Diagnosis present

## 2017-12-04 DIAGNOSIS — I25700 Atherosclerosis of coronary artery bypass graft(s), unspecified, with unstable angina pectoris: Secondary | ICD-10-CM | POA: Diagnosis present

## 2017-12-04 DIAGNOSIS — Z79899 Other long term (current) drug therapy: Secondary | ICD-10-CM | POA: Diagnosis not present

## 2017-12-04 DIAGNOSIS — E785 Hyperlipidemia, unspecified: Secondary | ICD-10-CM | POA: Diagnosis present

## 2017-12-04 DIAGNOSIS — Z951 Presence of aortocoronary bypass graft: Secondary | ICD-10-CM | POA: Diagnosis not present

## 2017-12-04 DIAGNOSIS — I4891 Unspecified atrial fibrillation: Secondary | ICD-10-CM | POA: Diagnosis present

## 2017-12-04 DIAGNOSIS — I472 Ventricular tachycardia: Secondary | ICD-10-CM | POA: Diagnosis present

## 2017-12-04 DIAGNOSIS — Z7982 Long term (current) use of aspirin: Secondary | ICD-10-CM | POA: Diagnosis not present

## 2017-12-04 DIAGNOSIS — I255 Ischemic cardiomyopathy: Secondary | ICD-10-CM | POA: Diagnosis present

## 2017-12-04 DIAGNOSIS — I5043 Acute on chronic combined systolic (congestive) and diastolic (congestive) heart failure: Secondary | ICD-10-CM | POA: Diagnosis present

## 2017-12-04 DIAGNOSIS — I25119 Atherosclerotic heart disease of native coronary artery with unspecified angina pectoris: Secondary | ICD-10-CM | POA: Diagnosis not present

## 2017-12-04 DIAGNOSIS — Z7902 Long term (current) use of antithrombotics/antiplatelets: Secondary | ICD-10-CM

## 2017-12-04 DIAGNOSIS — I361 Nonrheumatic tricuspid (valve) insufficiency: Secondary | ICD-10-CM | POA: Diagnosis not present

## 2017-12-04 DIAGNOSIS — I252 Old myocardial infarction: Secondary | ICD-10-CM

## 2017-12-04 DIAGNOSIS — I214 Non-ST elevation (NSTEMI) myocardial infarction: Secondary | ICD-10-CM | POA: Diagnosis present

## 2017-12-04 DIAGNOSIS — I4892 Unspecified atrial flutter: Secondary | ICD-10-CM

## 2017-12-04 DIAGNOSIS — Z955 Presence of coronary angioplasty implant and graft: Secondary | ICD-10-CM

## 2017-12-04 DIAGNOSIS — I429 Cardiomyopathy, unspecified: Secondary | ICD-10-CM | POA: Diagnosis not present

## 2017-12-04 DIAGNOSIS — Z91048 Other nonmedicinal substance allergy status: Secondary | ICD-10-CM

## 2017-12-04 DIAGNOSIS — I25709 Atherosclerosis of coronary artery bypass graft(s), unspecified, with unspecified angina pectoris: Secondary | ICD-10-CM | POA: Diagnosis present

## 2017-12-04 DIAGNOSIS — R079 Chest pain, unspecified: Secondary | ICD-10-CM

## 2017-12-04 DIAGNOSIS — I2582 Chronic total occlusion of coronary artery: Secondary | ICD-10-CM | POA: Diagnosis present

## 2017-12-04 DIAGNOSIS — I21A1 Myocardial infarction type 2: Secondary | ICD-10-CM | POA: Diagnosis present

## 2017-12-04 DIAGNOSIS — I519 Heart disease, unspecified: Secondary | ICD-10-CM | POA: Diagnosis not present

## 2017-12-04 DIAGNOSIS — I5042 Chronic combined systolic (congestive) and diastolic (congestive) heart failure: Secondary | ICD-10-CM | POA: Diagnosis present

## 2017-12-04 DIAGNOSIS — I083 Combined rheumatic disorders of mitral, aortic and tricuspid valves: Secondary | ICD-10-CM | POA: Diagnosis present

## 2017-12-04 DIAGNOSIS — I342 Nonrheumatic mitral (valve) stenosis: Secondary | ICD-10-CM | POA: Diagnosis not present

## 2017-12-04 DIAGNOSIS — I5023 Acute on chronic systolic (congestive) heart failure: Secondary | ICD-10-CM

## 2017-12-04 HISTORY — DX: Unspecified atrial flutter: I48.92

## 2017-12-04 LAB — I-STAT TROPONIN, ED: Troponin i, poc: 0.13 ng/mL (ref 0.00–0.08)

## 2017-12-04 LAB — BASIC METABOLIC PANEL
ANION GAP: 8 (ref 5–15)
BUN: 14 mg/dL (ref 6–20)
CO2: 23 mmol/L (ref 22–32)
Calcium: 9.2 mg/dL (ref 8.9–10.3)
Chloride: 109 mmol/L (ref 101–111)
Creatinine, Ser: 1.11 mg/dL — ABNORMAL HIGH (ref 0.44–1.00)
GFR calc Af Amer: >60 mL/min (ref >60)
GFR, EST NON AFRICAN AMERICAN: 53 mL/min — AB (ref >60)
GLUCOSE: 113 mg/dL — AB (ref 65–99)
Potassium: 4.6 mmol/L (ref 3.5–5.1)
Sodium: 140 mmol/L (ref 135–145)

## 2017-12-04 LAB — CBC
HEMATOCRIT: 39 % (ref 36.0–46.0)
HEMOGLOBIN: 12.3 g/dL (ref 12.0–15.0)
MCH: 29.9 pg (ref 26.0–34.0)
MCHC: 31.5 g/dL (ref 30.0–36.0)
MCV: 94.9 fL (ref 78.0–100.0)
Platelets: 202 10*3/uL (ref 150–400)
RBC: 4.11 MIL/uL (ref 3.87–5.11)
RDW: 15.8 % — ABNORMAL HIGH (ref 11.5–15.5)
WBC: 6.9 10*3/uL (ref 4.0–10.5)

## 2017-12-04 LAB — LIPID PANEL
CHOL/HDL RATIO: 3.8 ratio
Cholesterol: 123 mg/dL (ref 0–200)
HDL: 32 mg/dL — AB (ref >40)
LDL CALC: 73 mg/dL (ref 0–99)
Triglycerides: 89 mg/dL (ref <150)
VLDL: 18 mg/dL (ref 0–40)

## 2017-12-04 LAB — BRAIN NATRIURETIC PEPTIDE: B NATRIURETIC PEPTIDE 5: 739.8 pg/mL — AB (ref 0.0–100.0)

## 2017-12-04 MED ORDER — MUPIROCIN 2 % EX OINT: 1.0000 "application " | TOPICAL_OINTMENT | Freq: Two times a day (BID) | CUTANEOUS | Status: DC

## 2017-12-04 MED ORDER — ACETAMINOPHEN 500 MG PO TABS
1000.0000 mg | ORAL_TABLET | Freq: Four times a day (QID) | ORAL | Status: DC | PRN
  Administered 2017-12-06 – 2017-12-12 (×8): 1000 mg via ORAL
  Filled 2017-12-04 (×8): qty 2

## 2017-12-04 MED ORDER — ONDANSETRON HCL 4 MG/2ML IJ SOLN: 4.0000 mg | Freq: Four times a day (QID) | INTRAMUSCULAR | Status: DC | PRN

## 2017-12-04 MED ORDER — HEPARIN (PORCINE) IN NACL 100-0.45 UNIT/ML-% IJ SOLN
1000.0000 [IU]/h | INTRAMUSCULAR | Status: DC
  Administered 2017-12-04: 1000 [IU]/h via INTRAVENOUS
  Filled 2017-12-04 (×2): qty 250

## 2017-12-04 MED ORDER — NITROGLYCERIN 0.4 MG SL SUBL: 0.4000 mg | SUBLINGUAL_TABLET | SUBLINGUAL | Status: DC | PRN

## 2017-12-04 MED ORDER — ACETAMINOPHEN 325 MG PO TABS: 650.0000 mg | ORAL_TABLET | ORAL | Status: DC | PRN

## 2017-12-04 MED ORDER — IOPAMIDOL (ISOVUE-370) INJECTION 76%
100.0000 mL | Freq: Once | INTRAVENOUS | Status: AC | PRN
  Administered 2017-12-04: 100 mL via INTRAVENOUS

## 2017-12-04 MED ORDER — CARVEDILOL 12.5 MG PO TABS
12.5000 mg | ORAL_TABLET | Freq: Two times a day (BID) | ORAL | Status: DC
  Administered 2017-12-05 – 2017-12-07 (×5): 12.5 mg via ORAL
  Filled 2017-12-04 (×5): qty 1

## 2017-12-04 MED ORDER — HEPARIN (PORCINE) IN NACL 100-0.45 UNIT/ML-% IJ SOLN: 8.0000 [IU]/kg/h | Freq: Once | INTRAMUSCULAR | Status: DC

## 2017-12-04 MED ORDER — CLOPIDOGREL BISULFATE 75 MG PO TABS
225.0000 mg | ORAL_TABLET | Freq: Once | ORAL | Status: AC
  Administered 2017-12-04: 225 mg via ORAL
  Filled 2017-12-04: qty 3

## 2017-12-04 MED ORDER — ATORVASTATIN CALCIUM 80 MG PO TABS
80.0000 mg | ORAL_TABLET | Freq: Every day | ORAL | Status: DC
  Administered 2017-12-04 – 2017-12-13 (×10): 80 mg via ORAL
  Filled 2017-12-04 (×11): qty 1

## 2017-12-04 MED ORDER — TRIAMCINOLONE ACETONIDE 0.1 % EX CREA
1.0000 "application " | TOPICAL_CREAM | Freq: Every day | CUTANEOUS | Status: DC
  Administered 2017-12-04 – 2017-12-12 (×6): 1 via TOPICAL
  Filled 2017-12-04: qty 15

## 2017-12-04 MED ORDER — HEPARIN BOLUS VIA INFUSION
3500.0000 [IU] | Freq: Once | INTRAVENOUS | Status: AC
  Administered 2017-12-04: 3500 [IU] via INTRAVENOUS
  Filled 2017-12-04: qty 3500

## 2017-12-04 MED ORDER — ADULT MULTIVITAMIN W/MINERALS CH
1.0000 | ORAL_TABLET | Freq: Every day | ORAL | Status: DC
  Administered 2017-12-05 – 2017-12-13 (×8): 1 via ORAL
  Filled 2017-12-04 (×9): qty 1

## 2017-12-04 MED ORDER — HEPARIN SODIUM (PORCINE) 5000 UNIT/ML IJ SOLN: 4000.0000 [IU] | Freq: Once | INTRAMUSCULAR | Status: DC

## 2017-12-04 MED ORDER — MORPHINE SULFATE (PF) 4 MG/ML IV SOLN
2.0000 mg | Freq: Once | INTRAVENOUS | Status: AC
  Administered 2017-12-04: 2 mg via INTRAVENOUS
  Filled 2017-12-04: qty 1

## 2017-12-04 MED ORDER — LISINOPRIL 40 MG PO TABS
40.0000 mg | ORAL_TABLET | Freq: Every day | ORAL | Status: DC
  Administered 2017-12-05 – 2017-12-06 (×2): 40 mg via ORAL
  Filled 2017-12-04 (×2): qty 1

## 2017-12-04 MED ORDER — ASPIRIN EC 81 MG PO TBEC
81.0000 mg | DELAYED_RELEASE_TABLET | Freq: Every day | ORAL | Status: DC
  Administered 2017-12-04 – 2017-12-13 (×9): 81 mg via ORAL
  Filled 2017-12-04 (×10): qty 1

## 2017-12-04 MED ORDER — CLOPIDOGREL BISULFATE 75 MG PO TABS: 75.0000 mg | ORAL_TABLET | Freq: Every day | ORAL | Status: DC

## 2017-12-04 MED ORDER — FUROSEMIDE 10 MG/ML IJ SOLN
40.0000 mg | Freq: Once | INTRAMUSCULAR | Status: AC
  Administered 2017-12-04: 40 mg via INTRAVENOUS
  Filled 2017-12-04: qty 4

## 2017-12-04 NOTE — ED Notes (Signed)
Istat troponin collected at 15:46, checked with minilab and it was never run. PA made aware and troponin recollected

## 2017-12-04 NOTE — Progress Notes (Signed)
ANTICOAGULATION CONSULT NOTE - Follow Up Consult  Pharmacy Consult for Heparin Indication: atrial fibrillation  Allergies  Allergen Reactions  . Other Itching    GEL ON ELECTRODES FOR EKG    Patient Measurements: Height: 5' 2.5" (158.8 cm) Weight: 162 lb (73.5 kg) IBW/kg (Calculated) : 51.25   Vital Signs: Temp: 98 F (36.7 C) (05/19 1529) Temp Source: Oral (05/19 1529) BP: 126/96 (05/19 1900) Pulse Rate: 129 (05/19 1900)  Labs: Recent Labs    12/04/17 1536  HGB 12.3  HCT 39.0  PLT 202  CREATININE 1.11*    Estimated Creatinine Clearance: 51.9 mL/min (A) (by C-G formula based on SCr of 1.11 mg/dL (H)).  Assessment: 57 year old female to begin heparin for Afib  Goal of Therapy:  Heparin level 0.3-0.7 units/ml Monitor platelets by anticoagulation protocol: Yes   Plan:  Heparin 3500 units iv bolus x 1 Heparin drip at 1000 units / hr Heparin level 6 hours after heparin begins Daily heparin level, CBC  Thank you Okey Regal, PharmD [phone]  12/04/2017,8:33 PM

## 2017-12-04 NOTE — H&P (Signed)
CARDIOLOGY H&P  HPI: Yoshino Broccoli is a 57 y.o. female w/ history of HTN, HLD, HFrEF, and CAD s/p CABG 2007 w/ subsequent PCI to SVG in 2017 for NSTEMI who presents w/ chest pain.   Patient reports chest pain starting 2 days ago. Pain is central chest, worse with exertion, no alleviating factors. Has had some shortness of breath over this period too. Thinks that she's felt an abnormal heart beat, but not completely sure of this. Has been taking all of her medications as directed. Has a history of atrial fibrillation per chart and was on coumadin in the past, however this was discontinued for unclear reasons and she has not been on [facility] in many years.  On arrival to ED, patient found to be tachycardic and with positive troponin.   Review of Systems:     Cardiac Review of Systems: {Y] = yes  = no  Chest Pain [  X  ]  Resting SOB [   ] Exertional SOB  [  ]  Orthopnea [  ]   Pedal Edema [   ]    Palpitations [ X ] Syncope  [  ]   Presyncope [   ]  General Review of Systems: [Y] = yes [  ]=no Constitional: recent weight change [  ]; anorexia [  ]; fatigue [  ]; nausea [  ]; night sweats [  ]; fever [  ]; or chills [  ];                                                                     Dental: poor dentition[  ];   Eye : blurred vision [  ]; diplopia [   ]; vision changes [  ];  Amaurosis fugax[  ]; Resp: cough [  ];  wheezing[  ];  hemoptysis[  ]; shortness of breath[  ]; paroxysmal nocturnal dyspnea[  ]; dyspnea on exertion[  ]; or orthopnea[  ];  GI:  gallstones[  ], vomiting[  ];  dysphagia[  ]; melena[  ];  hematochezia [  ]; heartburn[  ];   GU: kidney stones [  ]; hematuria[  ];   dysuria [  ];  nocturia[  ];               Skin: rash [  ], swelling[  ];, hair loss[  ];  peripheral edema[  ];  or itching[  ]; Musculosketetal: myalgias[  ];  joint swelling[  ];  joint erythema[  ];  joint pain[  ];  back pain[  ];  Heme/Lymph: bruising[  ];  bleeding[  ];  anemia[  ];  Neuro: TIA[  ];   headaches[  ];  stroke[  ];  vertigo[  ];  seizures[  ];   paresthesias[  ];  difficulty walking[  ];  Psych:depression[  ]; anxiety[  ];  Endocrine: diabetes[  ];  thyroid dysfunction[  ];  Other:  Past Medical History:  Diagnosis Date  . Atrial fibrillation (HCC)   . CHF (congestive heart failure) (HCC)   . Coronary atherosclerosis of native coronary artery   . HF (heart failure) (HCC)    with LVEF 40% 2008, Echo, functional class 2.  [name]  HTN (hypertension)   . Hx of CABG    LIMA-LAD, SVG-DIAG, SVG-OM, SVG-RCA  . Hypercholesteremia   . Myocardial infarct (HCC) 2007  . NSTEMI (non-ST elevated myocardial infarction) (HCC) 10/13/2015  . Obesity     Prior to Admission medications   Medication Sig Start Date End Date Taking? Authorizing Provider  aspirin EC 81 MG tablet Take 1 tablet (81 mg total) by mouth daily. 01/25/17   Lyn Records, MD  atorvastatin (LIPITOR) 80 MG tablet TAKE 1 TABLET (80 MG TOTAL) BY MOUTH DAILY AT 6 PM. 02/25/16   Barrett, Joline Salt, PA-C  carvedilol (COREG) 12.5 MG tablet TAKE 1 TABLET (12.5 MG TOTAL) BY MOUTH 2 (TWO) TIMES DAILY WITH A MEAL. 09/05/17   Lyn Records, MD  clopidogrel (PLAVIX) 75 MG tablet Take 1 tablet (75 mg total) by mouth daily. 01/25/17   Lyn Records, MD  furosemide (LASIX) 40 MG tablet Take 1 tablet (40 mg total) by mouth daily. 10/20/17   Lyn Records, MD  lisinopril (PRINIVIL,ZESTRIL) 40 MG tablet Take 1 tablet (40 mg total) by mouth daily. 08/29/17   Lyn Records, MD  nitroGLYCERIN (NITROSTAT) 0.4 MG SL tablet Place 0.4 mg under the tongue every 5 (five) minutes as needed for chest pain. Up to 3 doses before calling EMS    [provider]    Allergies  Allergen Reactions  . Other Itching    GEL ON ELECTRODES FOR EKG    Social History   Socioeconomic History  . Marital status: Married    Spouse name: Not on file  . Number of children: Not on file  . Years of education: Not on file  . Highest education level: Not on  file  Occupational History  . Not on file  Social Needs  . Financial resource strain: Not on file  . Food insecurity:    Worry: Not on file    Inability: Not on file  . Transportation needs:    Medical: Not on file    Non-medical: Not on file  Tobacco Use  . Smoking status: Former Smoker    Packs/day: 1.00    Years: 30.00    Pack years: 30.00    Types: Cigarettes  . Smokeless tobacco: Never Used  . Tobacco comment: "quit smoking cigarettes in ~ 2007"  Substance and Sexual Activity  . Alcohol use: Yes    Alcohol/week: 0.6 oz    Types: 1 Cans of beer per week    Comment: occ  . Drug use: No  . Sexual activity: Not Currently  Lifestyle  . Physical activity:    Days per week: Not on file    Minutes per session: Not on file  . Stress: Not on file  Relationships  . Social connections:    Talks on phone: Not on file    Gets together: Not on file    Attends religious service: Not on file    Active member of club or organization: Not on file    Attends meetings of clubs or organizations: Not on file    Relationship status: Not on file  . Intimate partner violence:    Fear of current or ex partner: Not on file    Emotionally abused: Not on file    Physically abused: Not on file    Forced sexual activity: Not on file  Other Topics Concern  . Not on file  Social History Narrative  . Not on file    Family History  Problem Relation Age of Onset  . Hypertension Father   . Stroke Sister   . Hypertension Sister   . Hypertension Brother   . Heart attack Neg Hx     PHYSICAL EXAM: Vitals:   12/04/17 1830 12/04/17 1900  BP: (!) 116/91 (!) 126/96  Pulse:  (!) 129  Resp: (!) 23   Temp:    SpO2:  99%   General:  Well appearing. No respiratory difficulty HEENT: normal Neck: supple. JVP ~14 cm H2O. Cor: PMI nondisplaced. Irregularly irregular rhythm. 2/6 HSM throughout precordium Lungs: crackles throughout L>R Abdomen: soft, nontender, nondistended. No  hepatosplenomegaly. No bruits or masses. Good bowel sounds. Extremities: no cyanosis, clubbing, rash, edema Neuro: alert & oriented x 3, cranial nerves grossly intact. moves all 4 extremities w/o difficulty. Affect pleasant.  ECG: atrial flutter, HR 130 bpm, nonspecific ST/T wave abnormalities  Results for orders placed or performed during the hospital encounter of 12/04/17 (from the past 24 hour(s))  Basic metabolic panel     Status: Abnormal   Collection Time: 12/04/17  3:36 PM  Result Value Ref Range   Sodium 140 135 - 145 mmol/L   Potassium 4.6 3.5 - 5.1 mmol/L   Chloride 109 101 - 111 mmol/L   CO2 23 22 - 32 mmol/L   Glucose, Bld 113 (H) 65 - 99 mg/dL   BUN 14 6 - 20 mg/dL   Creatinine, Ser 4.09 (H) 0.44 - 1.00 mg/dL   Calcium 9.2 8.9 - 81.1 mg/dL   GFR calc non Af Amer 53 (L) >60 mL/min   GFR calc Af Amer >60 >60 mL/min   Anion gap 8 5 - 15  CBC     Status: Abnormal   Collection Time: 12/04/17  3:36 PM  Result Value Ref Range   WBC 6.9 4.0 - 10.5 K/uL   RBC 4.11 3.87 - 5.11 MIL/uL   Hemoglobin 12.3 12.0 - 15.0 g/dL   HCT 91.4 78.2 - 95.6 %   MCV 94.9 78.0 - 100.0 fL   MCH 29.9 26.0 - 34.0 pg   MCHC 31.5 30.0 - 36.0 g/dL   RDW 21.3 (H) 08.6 - 57.8 %   Platelets 202 150 - 400 K/uL  Brain natriuretic peptide     Status: Abnormal   Collection Time: 12/04/17  3:36 PM  Result Value Ref Range   B Natriuretic Peptide 739.8 (H) 0.0 - 100.0 pg/mL  I-stat troponin, ED     Status: Abnormal   Collection Time: 12/04/17  6:49 PM  Result Value Ref Range   Troponin i, poc 0.13 (HH) 0.00 - 0.08 ng/mL   Comment NOTIFIED PHYSICIAN    Comment 3           Dg Chest 2 View  Result Date: 12/04/2017 CLINICAL DATA:  Chest pain and shortness of breath EXAM: CHEST - 2 VIEW COMPARISON:  Chest radiograph 10/13/2015. FINDINGS: Monitoring leads overlie the patient. Marked enlargement cardiopericardial silhouette. Patient status post median sternotomy and CABG procedure. Mild retrocardiac  consolidation. No pleural effusion or pneumothorax. Thoracic spine degenerative changes. IMPRESSION: Marked enlargement of the cardiopericardial silhouette which may represent cardiomegaly. Pericardial effusion not excluded. Heterogeneous opacities left lung base may represent atelectasis. Electronically Signed   By: Annia Belt M.D.   On: 12/04/2017 16:13   Ct Angio Chest Pe W And/or Wo Contrast  Result Date: 12/04/2017 CLINICAL DATA:  57 year old female with acute shortness of breath and chest pain. EXAM: CT ANGIOGRAPHY CHEST WITH CONTRAST TECHNIQUE: Multidetector CT imaging of  the chest was performed using the standard protocol during bolus administration of intravenous contrast. Multiplanar CT image reconstructions and MIPs were obtained to evaluate the vascular anatomy. CONTRAST:  ISOVUE-370 IOPAMIDOL (ISOVUE-370) INJECTION 76% COMPARISON:  12/04/2017 and prior chest radiographs. 03/25/2006 chest CT FINDINGS: Cardiovascular: This is a technically satisfactory study. No pulmonary emboli are identified. Cardiomegaly, CABG and mitral valve replacement changes noted. Thoracic aortic atherosclerotic calcifications identified without aneurysm. No pericardial effusion. Mediastinum/Nodes: No enlarged mediastinal, hilar, or axillary lymph nodes. Thyroid gland, trachea, and esophagus demonstrate no significant findings. Lungs/Pleura: Mild interlobular septal thickening is identified, RIGHT greater than LEFT and may represent mild interstitial edema. A small RIGHT pleural effusion is present. Mild bibasilar atelectasis identified. Subcentimeter nodules within the LEFT UPPER lobe and RIGHT LOWER lobe are unchanged-benign. No mass or pneumothorax. Upper Abdomen: No acute abnormality Musculoskeletal: No acute or suspicious bony abnormalities. Review of the MIP images confirms the above findings. IMPRESSION: 1. No evidence of pulmonary emboli 2. Mild interlobular septal thickening, RIGHT greater than LEFT, which may  represent mild interstitial edema. 3. Small RIGHT pleural effusion 4. Cardiomegaly, CABG and mitral valve replacement changes. 5.  Aortic Atherosclerosis (ICD10-I70.0). Electronically Signed   By: Harmon Pier M.D.   On: 12/04/2017 18:49   ASSESSMENT: Lamees Gable is a 57 y.o. female w/ history of HTN, HLD, HFrEF, and CAD s/p CABG 2007 w/ subsequent PCI to SVG in 2017 for NSTEMI who presents w/ chest pain, found to have NSTEMI. The patient is also in acute decompensated heart failure as well as atrial flutter.   PLAN/DISCUSSION: #) NSTEMI: elevated troponin possibly due to demand ischemia in setting of rapid atrial flutter as well as acute decompensated heart failure, however given patient's report of chest pain probably reasonable to proceed with invasive anigoraphy.  - repeat troponin q6h x 2 - echo ordered - NPO for cath in AM - check lipids, A1c - ASA  then  daily - heparin drip for a-fib per pharmacy protocol - cont home atorva  QHS, carvedilol 12.5mg  BID, lisinopril  QD - reload plavix  x 1 followed by  daily - SLN, nitro gtt PRN  #) Acute on chronic systolic heart failure: LVEF ~35% on last check in 2017; likely ischemic in etiology; ACS and A-flutter likely contributors to decompensation; no signs of cardiogenic shock - IV lasix  x 1 now - diuresis w/ goal I/O -1L today - cont home beta blocker, ACE - echo ordered - if LVEF remains depressed, would warrant starting MRA such as spironolactone for GDMT  #) Atrial fib/flutter: history of AF in chart but prior ECGs dating back to 2017 all reveal sinus rhythm. In atrial flutter with variable AV conduction on presentation today. Unclear as to whether this is cause or effect of acute heart failure and ACS. Patient not on anticoagulation. Unclear reasons. CHADS-VASc 4 (CHF, HTN, Vascular disease, Sex). - start heparin gtt per pharmacy protocol for AF - plan to start oral anticoagulant after cath - will likely  need to d/c aspirin and continue OAC and P2Y12 for dual therapy after cath - cont home beta blocker - if AFL persists despite improvement in above 2 problems, would be worthwhile considering TEE/DCCV during current hospitalization  Rosario Jacks, MD Cardiology Fellow, PGY-5

## 2017-12-04 NOTE — ED Notes (Signed)
Cardiology at bedside.

## 2017-12-04 NOTE — ED Provider Notes (Signed)
[facility] EMERGENCY DEPARTMENT Provider Note   CSN: [id number] Arrival date & time: 12/04/17  1525     History   Chief Complaint Chief Complaint  Patient presents with  . Chest Pain  . Shortness of Breath    HPI [name] is a 57 y.o. female past medical history A. fib, CHF, triple bypass, CAD, hypertension, and STEMI who presents for evaluation of chest pain/tightness and shortness of breath that has been intermittently ongoing for the last week.  Patient reports the symptoms became constant over the last 3 days.  She states that she noticed when she started walking around her house, she would become very short of breath and chest tightness and would have to sit down.  Patient states that at baseline, she is usually able to walk around her house without any difficulty.  Patient has no baseline oxygen requirement at home.  Patient states that the chest pain is a tightness and states it is a 7 out of 10 central pain.  She states that it does not radiate anywhere.  She states it is worse with deep inspiration and worse with exertion.  She has not had any associated nausea, vomiting, diaphoresis.  She states that this pain does not feel like when she had a heart attack before.  Patient reports that she is seen by cardiology and last saw them approximately 1 month ago.  Patient denies any recent sickness, fevers, abdominal pain, cough, nausea/vomiting. She denies any OCP use, recent immobilization, prior history of DVT/PE, recent surgery, leg swelling, or long travel.  The history is provided by the patient.    Past Medical History:  Diagnosis Date  . Atrial fibrillation (HCC)   . CHF (congestive heart failure) (HCC)   . Coronary atherosclerosis of native coronary artery   . HF (heart failure) (HCC)    with LVEF 40% 2008, Echo, functional class 2.  . HTN (hypertension)   . Hx of CABG    LIMA-LAD, SVG-DIAG, SVG-OM, SVG-RCA  . Hypercholesteremia   . Myocardial  infarct (HCC) 2007  . NSTEMI (non-ST elevated myocardial infarction) (HCC) 10/13/2015  . Obesity     Patient Active Problem List   Diagnosis Date Noted  . ACS (acute coronary syndrome) (HCC) 10/15/2015  . NSTEMI (non-ST elevated myocardial infarction) (HCC)   . Hypercholesteremia   . Atrial fibrillation (HCC)   . Chronic combined systolic and diastolic HF (heart failure), NYHA class 2 (HCC)   . Obesity   . Coronary artery disease involving coronary bypass graft of native heart with angina pectoris (HCC)   . HTN (hypertension)     Past Surgical History:  Procedure Laterality Date  . ABDOMINAL HYSTERECTOMY    . CARDIAC CATHETERIZATION N/A 10/14/2015   Procedure: Left Heart Cath and Cors/Grafts Angiography;  Surgeon: Marykay Lex, MD;  Location: [facility] INVASIVE CV LAB;  Service: Cardiovascular;  Laterality: N/A;  . CARDIAC CATHETERIZATION N/A 10/14/2015   Procedure: Coronary Stent Intervention;  Surgeon: Marykay Lex, MD;  Location: [facility] INVASIVE CV LAB;  Service: Cardiovascular;  Laterality: N/A;  . CESAREAN SECTION  1985  . CORONARY ANGIOPLASTY WITH STENT PLACEMENT  2003   LAD Promus DES [name] 10/13/2015  . CORONARY ARTERY BYPASS GRAFT  03/2006   "CABG X3"  . MITRAL VALVE REPAIR     [name] 10/13/2015     OB History   None      Home Medications    Prior to Admission medications   Medication Sig Start  Date End Date Taking? Authorizing Provider  acetaminophen (TYLENOL) 500 MG tablet Take 1,000 mg by mouth every 6 (six) hours as needed for headache (pain).   Yes [provider]  aspirin EC 81 MG tablet Take 1 tablet (81 mg total) by mouth daily. 01/25/17  Yes Lyn Records, MD  atorvastatin (LIPITOR) 80 MG tablet TAKE 1 TABLET (80 MG TOTAL) BY MOUTH DAILY AT 6 PM. Patient taking differently: Take 80 mg by mouth daily.  02/25/16  Yes Barrett, Joline Salt, PA-C  carvedilol (COREG) 12.5 MG tablet TAKE 1 TABLET (12.5 MG TOTAL) BY MOUTH 2 (TWO) TIMES DAILY WITH A MEAL. 09/05/17   Yes Lyn Records, MD  clopidogrel (PLAVIX) 75 MG tablet Take 1 tablet (75 mg total) by mouth daily. 01/25/17  Yes Lyn Records, MD  furosemide (LASIX) 40 MG tablet Take 1 tablet (40 mg total) by mouth daily. 10/20/17  Yes Lyn Records, MD  lisinopril (PRINIVIL,ZESTRIL) 40 MG tablet Take 1 tablet (40 mg total) by mouth daily. 08/29/17  Yes Lyn Records, MD  Multiple Vitamin (MULTIVITAMIN WITH MINERALS) TABS tablet Take 1 tablet by mouth daily.   Yes [provider]  triamcinolone cream (KENALOG) 0.1 % Apply 1 application topically at bedtime.   Yes [provider]  nitroGLYCERIN (NITROSTAT) 0.4 MG SL tablet Place 0.4 mg under the tongue every 5 (five) minutes as needed for chest pain (up to 3 doses before calling EMS).     [provider]    Family History Family History  Problem Relation Age of Onset  . Hypertension Father   . Stroke Sister   . Hypertension Sister   . Hypertension Brother   . Heart attack Neg Hx     Social History Social History   Tobacco Use  . Smoking status: Former Smoker    Packs/day: 1.00    Years: 30.00    Pack years: 30.00    Types: Cigarettes  . Smokeless tobacco: Never Used  . Tobacco comment: "quit smoking cigarettes in ~ 2007"  Substance Use Topics  . Alcohol use: Yes    Alcohol/week: 0.6 oz    Types: 1 Cans of beer per week    Comment: occ  . Drug use: No     Allergies   Other   Review of Systems Review of Systems  Constitutional: Negative for chills and fever.  HENT: Negative for congestion.   Eyes: Negative for visual disturbance.  Respiratory: Positive for shortness of breath. Negative for cough.   Cardiovascular: Positive for chest pain. Negative for leg swelling.  Gastrointestinal: Negative for abdominal pain, diarrhea, nausea and vomiting.  Genitourinary: Negative for dysuria and hematuria.  Musculoskeletal: Negative for back pain and neck pain.  Skin: Negative for rash.  Neurological: Negative for  dizziness, weakness, numbness and headaches.  All other systems reviewed and are negative.    Physical Exam Updated Vital Signs BP (!) 132/98   Pulse (!) 133   Temp 98 F (36.7 C) (Oral)   Resp (!) 25   Ht 5' 2.5" (1.588 m)   Wt 73.5 kg (162 lb)   SpO2 100%   BMI 29.16 kg/m   Physical Exam  Constitutional: She is oriented to person, place, and time. She appears well-developed and well-nourished.  HENT:  Head: Normocephalic and atraumatic.  Mouth/Throat: Oropharynx is clear and moist and mucous membranes are normal.  Eyes: Pupils are equal, round, and reactive to light. Conjunctivae, EOM and lids are normal.  Neck:  Full passive range of motion without pain.  Cardiovascular: Normal rate, regular rhythm, normal heart sounds and normal pulses. Exam reveals no gallop and no friction rub.  No murmur heard. Pulmonary/Chest: Effort normal and breath sounds normal. Tachypnea noted.  Abdominal: Soft. Normal appearance. There is no tenderness. There is no rigidity and no guarding.  Musculoskeletal: Normal range of motion.  BLE are symmetric in appearance  Neurological: She is alert and oriented to person, place, and time.  Skin: Skin is warm and dry. Capillary refill takes less than 2 seconds.  Psychiatric: She has a normal mood and affect. Her speech is normal.  Nursing note and vitals reviewed.    ED Treatments / Results  Labs (all labs ordered are listed, but only abnormal results are displayed) Labs Reviewed  BASIC METABOLIC PANEL - Abnormal; Notable for the following components:      Result Value   Glucose, Bld 113 (*)    Creatinine, Ser 1.11 (*)    GFR calc non Af Amer 53 (*)    All other components within normal limits  CBC - Abnormal; Notable for the following components:   RDW 15.8 (*)    All other components within normal limits  BRAIN NATRIURETIC PEPTIDE - Abnormal; Notable for the following components:   B Natriuretic Peptide 739.8 (*)    All other components  within normal limits  LIPID PANEL - Abnormal; Notable for the following components:   HDL 32 (*)    All other components within normal limits  I-STAT TROPONIN, ED - Abnormal; Notable for the following components:   Troponin i, poc 0.13 (*)    All other components within normal limits  HEPARIN LEVEL (UNFRACTIONATED)  CBC  HEMOGLOBIN A1C  BASIC METABOLIC PANEL  HIV ANTIBODY (ROUTINE TESTING)  I-STAT TROPONIN, ED    EKG EKG Interpretation  Date/Time:  Sunday Dec 04 2017 15:29:08 EDT Ventricular Rate:  131 PR Interval:    QRS Duration: 111 QT Interval:  365 QTC Calculation: 539 R Axis:   -23 Text Interpretation:  Sinus tachycardia Inferior infarct, age indeterminate Lateral leads are also involved Prolonged QT interval increased rate from prior 3/17 Confirmed by Meridee Score [phone]) on 12/04/2017 4:08:59 PM   Radiology Dg Chest 2 View  Result Date: 12/04/2017 CLINICAL DATA:  Chest pain and shortness of breath EXAM: CHEST - 2 VIEW COMPARISON:  Chest radiograph 10/13/2015. FINDINGS: Monitoring leads overlie the patient. Marked enlargement cardiopericardial silhouette. Patient status post median sternotomy and CABG procedure. Mild retrocardiac consolidation. No pleural effusion or pneumothorax. Thoracic spine degenerative changes. IMPRESSION: Marked enlargement of the cardiopericardial silhouette which may represent cardiomegaly. Pericardial effusion not excluded. Heterogeneous opacities left lung base may represent atelectasis. Electronically Signed   By: Annia Belt M.D.   On: 12/04/2017 16:13   Ct Angio Chest Pe W And/or Wo Contrast  Result Date: 12/04/2017 CLINICAL DATA:  57 year old female with acute shortness of breath and chest pain. EXAM: CT ANGIOGRAPHY CHEST WITH CONTRAST TECHNIQUE: Multidetector CT imaging of the chest was performed using the standard protocol during bolus administration of intravenous contrast. Multiplanar CT image reconstructions and MIPs were obtained to  evaluate the vascular anatomy. CONTRAST:  ISOVUE-370 IOPAMIDOL (ISOVUE-370) INJECTION 76% COMPARISON:  12/04/2017 and prior chest radiographs. 03/25/2006 chest CT FINDINGS: Cardiovascular: This is a technically satisfactory study. No pulmonary emboli are identified. Cardiomegaly, CABG and mitral valve replacement changes noted. Thoracic aortic atherosclerotic calcifications identified without aneurysm. No pericardial effusion. Mediastinum/Nodes: No enlarged mediastinal, hilar, or axillary lymph nodes.  Thyroid gland, trachea, and esophagus demonstrate no significant findings. Lungs/Pleura: Mild interlobular septal thickening is identified, RIGHT greater than LEFT and may represent mild interstitial edema. A small RIGHT pleural effusion is present. Mild bibasilar atelectasis identified. Subcentimeter nodules within the LEFT UPPER lobe and RIGHT LOWER lobe are unchanged-benign. No mass or pneumothorax. Upper Abdomen: No acute abnormality Musculoskeletal: No acute or suspicious bony abnormalities. Review of the MIP images confirms the above findings. IMPRESSION: 1. No evidence of pulmonary emboli 2. Mild interlobular septal thickening, RIGHT greater than LEFT, which may represent mild interstitial edema. 3. Small RIGHT pleural effusion 4. Cardiomegaly, CABG and mitral valve replacement changes. 5.  Aortic Atherosclerosis (ICD10-I70.0). Electronically Signed   By: Harmon Pier M.D.   On: 12/04/2017 18:49    Procedures .Critical Care Performed by: Maxwell Caul, PA-C Authorized by: Maxwell Caul, PA-C   Critical care provider statement:    Critical care time (minutes):  35   Critical care time was exclusive of:  Separately billable procedures and treating other patients   Critical care was necessary to treat or prevent imminent or life-threatening deterioration of the following conditions:  Cardiac failure, renal failure, respiratory failure and circulatory failure   Critical care was time spent  personally by me on the following activities:  Ordering and performing treatments and interventions, blood draw for specimens, development of treatment plan with patient or surrogate, ordering and review of laboratory studies, ordering and review of radiographic studies, discussions with consultants, pulse oximetry, evaluation of patient's response to treatment and re-evaluation of patient's condition   (including critical care time)  Medications Ordered in ED Medications  heparin bolus via infusion 3,500 Units (has no administration in time range)  heparin ADULT infusion 100 units/mL (25000 units/245mL sodium chloride 0.45%) (has no administration in time range)  acetaminophen (TYLENOL) tablet 1,000 mg (has no administration in time range)  aspirin EC tablet 81 mg (81 mg Oral Given 12/04/17 2140)  atorvastatin (LIPITOR) tablet 80 mg (has no administration in time range)  carvedilol (COREG) tablet 12.5 mg (has no administration in time range)  clopidogrel (PLAVIX) tablet 75 mg (has no administration in time range)  lisinopril (PRINIVIL,ZESTRIL) tablet 40 mg (has no administration in time range)  multivitamin with minerals tablet 1 tablet (has no administration in time range)  triamcinolone cream (KENALOG) 0.1 % 1 application (has no administration in time range)  nitroGLYCERIN (NITROSTAT) SL tablet 0.4 mg (has no administration in time range)  ondansetron (ZOFRAN) injection 4 mg (has no administration in time range)  iopamidol (ISOVUE-370) 76 % injection 100 mL (100 mLs Intravenous Contrast Given 12/04/17 1809)  morphine 4 MG/ML injection 2 mg (2 mg Intravenous Given 12/04/17 1928)  furosemide (LASIX) injection 40 mg (40 mg Intravenous Given 12/04/17 2140)  clopidogrel (PLAVIX) tablet 225 mg (225 mg Oral Given 12/04/17 2140)     Initial Impression / Assessment and Plan / ED Course  I have reviewed the triage vital signs and the nursing notes.  Pertinent labs & imaging results that were  available during my care of the patient were reviewed by me and considered in my medical decision making (see chart for details).  Clinical Course as of Dec 04 2217  Sun Dec 04, 2017  6066 57 year old female with significant coronary disease CABG here with increased shortness of breath and chest discomfort over last 3 days.  She states she gets extremely fatigued just walking from room to room.  She denies any change in her medications.  Here  she is tachycardic but satting 100% on room air.  She otherwise appears in no distress.  She is getting some labs EKG chest x-ray and a CT chest PE protocol.   [MB]  1912 Patient CT with no obvious PE but does have an elevated troponin and BNP.  She will need to be admitted for further management.   [MB]    Clinical Course User Index [MB] Terrilee Files, MD    57 year old female past medical history of MI, CHF, CAD, A. fib who presents for evaluation of worsening shortness of breath and chest pain.  States symptoms were intermittent for the last week but became more constant over the last 3 days.  States worse with exertion.  She is not even able to walk across her house without getting severely short of breath.  Has not noted any swelling in her legs.  No PE risk factors.  Given aspirin in route.  On initial ED arrival, patient is afebrile but is tachycardic and tachypneic.  Her oxygen saturations are greater than 95% on room air.  Bilateral lower extremities appear symmetric in appearance.  Consider ACS etiology versus acute infectious etiology versus CHF exacerbation.  Also consider PE given patient's history and vital signs.  Plan to check basic labs, EKG, chest x-ray.  Will evaluate chest x-ray for infectious process but given concern about possible PE, will plan for CTA of the chest.  Given patient's history and presentation, she has a heart score of 5.  BNP is elevated at 739.8.  BNP shows slight bump in creatinine at 1.10.  Troponin is elevated at  0.13.  Otherwise unremarkable.  CBC without any significant leukocytosis, anemia.  Chest x-ray shows marked enlargement of the silhouette.  Cannot exclude pericardial effusion.  CTA is already in process given concerns for PE.  CTA negative for any acute PE.  Vital signs reviewed patient still is tachycardic.  Given positive troponin, risk factors, will consult cardiology for evaluation of possible cath for evaluation of an STEMI.  Discussed patient with Dr. Liane Comber (Cards).  Will come evaluate patient in the ED.  Discussed with cardiology after evaluation in the ED.  Unclear if this is an STEMI versus A. fib.  Will plan to admit the patient.  Recommend starting patient on 40 mg of IV Lasix, heparin bolus, followed by heparin drip.   Final Clinical Impressions(s) / ED Diagnoses   Final diagnoses:  Chest pain, unspecified type  NSTEMI (non-ST elevated myocardial infarction) [facility])    ED Discharge Orders    None       Rosana Hoes 12/04/17 2219    Terrilee Files, MD 12/05/17 1149

## 2017-12-04 NOTE — ED Triage Notes (Signed)
Pt arrives via EMS from with complaints of CP/ tightness and SOB x1 week. States that it went away yesterday and came back this morning. 7/10 central pain, non radiating. EMS gave 324 ASA

## 2017-12-05 ENCOUNTER — Inpatient Hospital Stay (HOSPITAL_COMMUNITY): Payer: 59

## 2017-12-05 DIAGNOSIS — I214 Non-ST elevation (NSTEMI) myocardial infarction: Secondary | ICD-10-CM

## 2017-12-05 DIAGNOSIS — I361 Nonrheumatic tricuspid (valve) insufficiency: Secondary | ICD-10-CM

## 2017-12-05 LAB — HEMOGLOBIN A1C
HEMOGLOBIN A1C: 5.9 % — AB (ref 4.8–5.6)
Mean Plasma Glucose: 122.63 mg/dL

## 2017-12-05 LAB — BASIC METABOLIC PANEL
ANION GAP: 12 (ref 5–15)
BUN: 12 mg/dL (ref 6–20)
CHLORIDE: 103 mmol/L (ref 101–111)
CO2: 27 mmol/L (ref 22–32)
Calcium: 9.2 mg/dL (ref 8.9–10.3)
Creatinine, Ser: 1.12 mg/dL — ABNORMAL HIGH (ref 0.44–1.00)
GFR calc Af Amer: >60 mL/min (ref >60)
GFR, EST NON AFRICAN AMERICAN: 53 mL/min — AB (ref >60)
Glucose, Bld: 102 mg/dL — ABNORMAL HIGH (ref 65–99)
Potassium: 3.7 mmol/L (ref 3.5–5.1)
SODIUM: 142 mmol/L (ref 135–145)

## 2017-12-05 LAB — CBC
HCT: 39.3 % (ref 36.0–46.0)
Hemoglobin: 12.5 g/dL (ref 12.0–15.0)
MCH: 30 pg (ref 26.0–34.0)
MCHC: 31.8 g/dL (ref 30.0–36.0)
MCV: 94.5 fL (ref 78.0–100.0)
PLATELETS: 218 10*3/uL (ref 150–400)
RBC: 4.16 MIL/uL (ref 3.87–5.11)
RDW: 16.2 % — AB (ref 11.5–15.5)
WBC: 6.8 10*3/uL (ref 4.0–10.5)

## 2017-12-05 LAB — HIV ANTIBODY (ROUTINE TESTING W REFLEX): HIV SCREEN 4TH GENERATION: NONREACTIVE

## 2017-12-05 LAB — ECHOCARDIOGRAM COMPLETE
HEIGHTINCHES: 62 in
WEIGHTICAEL: 2574.4 [oz_av]

## 2017-12-05 LAB — MRSA PCR SCREENING: MRSA BY PCR: NEGATIVE

## 2017-12-05 LAB — HEPARIN LEVEL (UNFRACTIONATED)
HEPARIN UNFRACTIONATED: 0.91 [IU]/mL — AB (ref 0.30–0.70)
Heparin Unfractionated: 0.83 IU/mL — ABNORMAL HIGH (ref 0.30–0.70)

## 2017-12-05 MED ORDER — APIXABAN 5 MG PO TABS
5.0000 mg | ORAL_TABLET | Freq: Two times a day (BID) | ORAL | Status: DC
  Administered 2017-12-05 – 2017-12-08 (×7): 5 mg via ORAL
  Filled 2017-12-05 (×7): qty 1

## 2017-12-05 MED ORDER — DILTIAZEM HCL-DEXTROSE 100-5 MG/100ML-% IV SOLN (PREMIX)
5.0000 mg/h | INTRAVENOUS | Status: DC
Start: 2017-12-05 — End: 2017-12-07
  Administered 2017-12-05: 12.5 mg/h via INTRAVENOUS
  Administered 2017-12-05: 10 mg/h via INTRAVENOUS
  Administered 2017-12-06 (×2): 12.5 mg/h via INTRAVENOUS
  Administered 2017-12-07: 15 mg/h via INTRAVENOUS
  Filled 2017-12-05 (×5): qty 100

## 2017-12-05 NOTE — Progress Notes (Signed)
Progress Note  Patient Name: [name] Date of Encounter: 12/05/2017  Primary Cardiologist:   No primary care provider on file.   Subjective   She is weak and she has some SOB with movement and some mild chest pressure.    Inpatient Medications    Scheduled Meds: . aspirin EC  81 mg Oral Daily  . atorvastatin  80 mg Oral Daily  . carvedilol  12.5 mg Oral BID WC  . clopidogrel  75 mg Oral Daily  . lisinopril  40 mg Oral Daily  . multivitamin with minerals  1 tablet Oral Daily  . triamcinolone cream  1 application Topical QHS   Continuous Infusions: . heparin 1,000 Units/hr (12/04/17 2332)   PRN Meds: acetaminophen, nitroGLYCERIN, ondansetron (ZOFRAN) IV   Vital Signs    Vitals:   12/04/17 2130 12/04/17 2200 12/04/17 2300 12/05/17 0455  BP: (!) 132/98  (!) 110/95 (!) 131/97  Pulse: (!) 131 (!) 133 (!) 132 (!) 133  Resp:  (!) Temp:   97.6 F (36.4 C) 97.6 F (36.4 C)  TempSrc:   Oral Oral  SpO2: 97% 100% 99% 99%  Weight:    160 lb 14.4 oz (73 kg)  Height:    (1.575 m)     Intake/Output Summary (Last 24 hours) at 12/05/2017 0740 Last data filed at 12/05/2017 0300 Gross per 24 hour  Intake 64.67 ml  Output 1000 ml  Net -935.33 ml   Filed Weights   12/04/17 1529 12/05/17 0455  Weight: 162 lb (73.5 kg) 160 lb 14.4 oz (73 kg)    Telemetry    Atrial flutter with 2:1 block.  - Personally Reviewed  ECG    NA - Personally Reviewed  Physical Exam   GEN: No acute distress.   Neck: No  JVD Cardiac: RRR, no murmurs, rubs, or gallops.  Respiratory: Clear  to auscultation bilaterally. GI: Soft, nontender, non-distended  MS: No  edema; No deformity. Neuro:  Nonfocal  Psych: Normal affect   Labs    Chemistry Recent Labs  Lab 12/04/17 1536 12/05/17 0234  NA 140 142  K 4.6 3.7  CL 109 103  CO2 23 27  GLUCOSE 113* 102*  BUN 14 12  CREATININE 1.11* 1.12*  CALCIUM 9.2 9.2  GFRNONAA 53* 53*  GFRAA >60 >60  ANIONGAP 8 12      Hematology Recent Labs  Lab 12/04/17 1536 12/05/17 0234  WBC 6.9 6.8  RBC 4.11 4.16  HGB 12.3 12.5  HCT 39.0 39.3  MCV 94.9 94.5  MCH 29.9 30.0  MCHC 31.5 31.8  RDW 15.8* 16.2*  PLT 202 218    Cardiac EnzymesNo results for input(s): TROPONINI in the last 168 hours.  Recent Labs  Lab 12/04/17 1849  TROPIPOC 0.13*     BNP Recent Labs  Lab 12/04/17 1536  BNP 739.8*     DDimer No results for input(s): DDIMER in the last 168 hours.   Radiology    Dg Chest 2 View  Result Date: 12/04/2017 CLINICAL DATA:  Chest pain and shortness of breath EXAM: CHEST - 2 VIEW COMPARISON:  Chest radiograph 10/13/2015. FINDINGS: Monitoring leads overlie the patient. Marked enlargement cardiopericardial silhouette. Patient status post median sternotomy and CABG procedure. Mild retrocardiac consolidation. No pleural effusion or pneumothorax. Thoracic spine degenerative changes. IMPRESSION: Marked enlargement of the cardiopericardial silhouette which may represent cardiomegaly. Pericardial effusion not excluded. Heterogeneous opacities left lung base may represent atelectasis. Electronically Signed   By:  Annia Belt M.D.   On: 12/04/2017 16:13   Ct Angio Chest Pe W And/or Wo Contrast  Result Date: 12/04/2017 CLINICAL DATA:  57 year old female with acute shortness of breath and chest pain. EXAM: CT ANGIOGRAPHY CHEST WITH CONTRAST TECHNIQUE: Multidetector CT imaging of the chest was performed using the standard protocol during bolus administration of intravenous contrast. Multiplanar CT image reconstructions and MIPs were obtained to evaluate the vascular anatomy. CONTRAST:  ISOVUE-370 IOPAMIDOL (ISOVUE-370) INJECTION 76% COMPARISON:  12/04/2017 and prior chest radiographs. 03/25/2006 chest CT FINDINGS: Cardiovascular: This is a technically satisfactory study. No pulmonary emboli are identified. Cardiomegaly, CABG and mitral valve replacement changes noted. Thoracic aortic atherosclerotic  calcifications identified without aneurysm. No pericardial effusion. Mediastinum/Nodes: No enlarged mediastinal, hilar, or axillary lymph nodes. Thyroid gland, trachea, and esophagus demonstrate no significant findings. Lungs/Pleura: Mild interlobular septal thickening is identified, RIGHT greater than LEFT and may represent mild interstitial edema. A small RIGHT pleural effusion is present. Mild bibasilar atelectasis identified. Subcentimeter nodules within the LEFT UPPER lobe and RIGHT LOWER lobe are unchanged-benign. No mass or pneumothorax. Upper Abdomen: No acute abnormality Musculoskeletal: No acute or suspicious bony abnormalities. Review of the MIP images confirms the above findings. IMPRESSION: 1. No evidence of pulmonary emboli 2. Mild interlobular septal thickening, RIGHT greater than LEFT, which may represent mild interstitial edema. 3. Small RIGHT pleural effusion 4. Cardiomegaly, CABG and mitral valve replacement changes. 5.  Aortic Atherosclerosis (ICD10-I70.0). Electronically Signed   By: Harmon Pier M.D.   On: 12/04/2017 18:49    Cardiac Studies   NA  Patient Profile     57 y.o. female / history of HTN, HLD, HFrEF, and CAD s/p CABG 2007 w/ subsequent PCI to SVG in 2017 for NSTEMI who presents w/ chest pain, found to have NSTEMI. The patient is also in acute decompensated heart failure as well as atrial flutter.   Assessment & Plan    ATRIAL FLUTTER:  2:1 conduction.  I suspect this is why she has been feeling weak and the reason for the trop increase.  We need to control this rhythm and probably cardiovert this admission. I am going to start with IV Cardizem.  Keep NPO for now.   Stop the Plavix.  Start Elqiuis.  Plan TEE/DCCV in the AM.    NSTEMI:    Will consider Lexiscan Myoview vs medical management pending symptoms after cardioversion.  Trop is likely elevated secondary to increased heart rate.   ACUTE ON CHRONIC SYSTOLIC HF:  Check echocardiogram.     For questions or  updates, please contact [facility] Please consult [email or web address] for contact info under Cardiology/STEMI.   Signed, Rollene Rotunda, MD  12/05/2017, 7:40 AM

## 2017-12-05 NOTE — Progress Notes (Signed)
ANTICOAGULATION CONSULT NOTE - Follow Up Consult  Pharmacy Consult for heparin Indication: atrial fibrillation  Labs: Recent Labs    12/04/17 1536 12/05/17 0234  HGB 12.3 12.5  HCT 39.0 39.3  PLT 202 218  HEPARINUNFRC  --  0.83*  CREATININE 1.11*  --     Assessment/Plan:  57yo female supratherapeutic on heparin with initial dosing for Afib though bolus was given late so lab was drawn just 4h after bolus so level is likely reflective of that. Will continue gtt at current rate for now and recheck level.   Vernard Gambles, PharmD, BCPS  12/05/2017,3:33 AM

## 2017-12-05 NOTE — Progress Notes (Signed)
  Echocardiogram 2D Echocardiogram has been performed.  [name] 12/05/2017, 11:31 AM

## 2017-12-05 NOTE — Progress Notes (Signed)
ANTICOAGULATION CONSULT NOTE    Pharmacy Consult for Eliquis Indication: atrial fibrillation  Allergies  Allergen Reactions  . Other Itching    GEL ON ELECTRODES FOR EKG   Patient Measurements: Height:  (157.5 cm) Weight: 160 lb 14.4 oz (73 kg) IBW/kg (Calculated) : 50.1  Vital Signs: Temp: 97.6 F (36.4 C) (05/20 0455) Temp Source: Oral (05/20 0455) BP: 131/97 (05/20 0455) Pulse Rate: 133 (05/20 0455)  Labs: Recent Labs    12/04/17 1536 12/05/17 0234 12/05/17 0735  HGB 12.3 12.5  --   HCT 39.0 39.3  --   PLT 202 218  --   HEPARINUNFRC  --  0.83* 0.91*  CREATININE 1.11* 1.12*  --    Estimated Creatinine Clearance: 50.6 mL/min (A) (by C-G formula based on SCr of 1.12 mg/dL (H)).  Medical History: Past Medical History:  Diagnosis Date  . Atrial fibrillation (HCC)   . CHF (congestive heart failure) (HCC)   . Coronary atherosclerosis of native coronary artery   . HF (heart failure) (HCC)    with LVEF 40% 2008, Echo, functional class 2.  . HTN (hypertension)   . Hx of CABG    LIMA-LAD, SVG-DIAG, SVG-OM, SVG-RCA  . Hypercholesteremia   . Myocardial infarct (HCC) 2007  . NSTEMI (non-ST elevated myocardial infarction) (HCC) 10/13/2015  . Obesity     Medications:  Medications Prior to Admission  Medication Sig Dispense Refill Last Dose  . acetaminophen (TYLENOL) 500 MG tablet Take 1,000 mg by mouth every 6 (six) hours as needed for headache (pain).   12/02/2017  . aspirin EC 81 MG tablet Take 1 tablet (81 mg total) by mouth daily. 90 tablet 3 12/02/2017  . atorvastatin (LIPITOR) 80 MG tablet TAKE 1 TABLET (80 MG TOTAL) BY MOUTH DAILY AT 6 PM. (Patient taking differently: Take 80 mg by mouth daily. ) 30 tablet 8 12/02/2017 at am  . carvedilol (COREG) 12.5 MG tablet TAKE 1 TABLET (12.5 MG TOTAL) BY MOUTH 2 (TWO) TIMES DAILY WITH A MEAL. 60 tablet 12 12/04/2017 at 1300  . clopidogrel (PLAVIX) 75 MG tablet Take 1 tablet (75 mg total) by mouth daily. 90 tablet 3  12/02/2017 at am  . furosemide (LASIX) 40 MG tablet Take 1 tablet (40 mg total) by mouth daily. 90 tablet 2 12/02/2017 at am  . lisinopril (PRINIVIL,ZESTRIL) 40 MG tablet Take 1 tablet (40 mg total) by mouth daily. 90 tablet 3 12/04/2017 at 1300  . Multiple Vitamin (MULTIVITAMIN WITH MINERALS) TABS tablet Take 1 tablet by mouth daily.   12/02/2017  . triamcinolone cream (KENALOG) 0.1 % Apply 1 application topically at bedtime.   12/03/2017 at pm  . nitroGLYCERIN (NITROSTAT) 0.4 MG SL tablet Place 0.4 mg under the tongue every 5 (five) minutes as needed for chest pain (up to 3 doses before calling EMS).    6 months ago    Assessment: 57 year old female transitioning from heparin to Eliquis for afib. CBC stable, no issues reported with heparin infusion or overt bleeding. sCr 1.1  Goal of Therapy:  Monitor platelets by anticoagulation protocol: Yes   Plan:  D/c heparin gtt Eliquis  BID Monitor renal function and for s/sx f bleeding  Deneane Stifter L Darrien Belter 12/05/2017,8:49 AM

## 2017-12-05 NOTE — Progress Notes (Signed)
Pt's HR sustaining at 130-133, 12 EKG showed 2:1 flutter. O2 sat 100% in RA. CP 2/10 , SOB with exertion, BP 113/95, Cardiology paged, DR. Carnicelli notified. MD stated there is nothing that can be done at this moment. Monitor and continue heparin gtt per MD. Cath in am. Will continue to monitor.

## 2017-12-06 DIAGNOSIS — I483 Typical atrial flutter: Secondary | ICD-10-CM

## 2017-12-06 DIAGNOSIS — I5043 Acute on chronic combined systolic (congestive) and diastolic (congestive) heart failure: Secondary | ICD-10-CM

## 2017-12-06 LAB — CBC
HEMATOCRIT: 39.6 % (ref 36.0–46.0)
Hemoglobin: 12.4 g/dL (ref 12.0–15.0)
MCH: 30.4 pg (ref 26.0–34.0)
MCHC: 31.3 g/dL (ref 30.0–36.0)
MCV: 97.1 fL (ref 78.0–100.0)
PLATELETS: 205 10*3/uL (ref 150–400)
RBC: 4.08 MIL/uL (ref 3.87–5.11)
RDW: 16.3 % — AB (ref 11.5–15.5)
WBC: 6 10*3/uL (ref 4.0–10.5)

## 2017-12-06 MED ORDER — SACUBITRIL-VALSARTAN 49-51 MG PO TABS
1.0000 | ORAL_TABLET | Freq: Two times a day (BID) | ORAL | Status: DC
  Administered 2017-12-07 – 2017-12-13 (×12): 1 via ORAL
  Filled 2017-12-06 (×14): qty 1

## 2017-12-06 MED ORDER — SODIUM CHLORIDE 0.9% FLUSH
3.0000 mL | Freq: Two times a day (BID) | INTRAVENOUS | Status: DC
  Administered 2017-12-06 – 2017-12-08 (×5): 3 mL via INTRAVENOUS

## 2017-12-06 MED ORDER — SODIUM CHLORIDE 0.9% FLUSH: 3.0000 mL | INTRAVENOUS | Status: DC | PRN

## 2017-12-06 MED ORDER — SODIUM CHLORIDE 0.9 % IV SOLN: 250.0000 mL | INTRAVENOUS | Status: DC

## 2017-12-06 NOTE — Progress Notes (Signed)
Progress Note  Patient Name: [name] Date of Encounter: 12/06/2017  Primary Cardiologist:   Lesleigh Noe, MD   Subjective   She is in no distress but reports that she is SOB when she is getting up to the toilet.  No pain.   Inpatient Medications    Scheduled Meds: . apixaban  5 mg Oral BID  . aspirin EC  81 mg Oral Daily  . atorvastatin  80 mg Oral Daily  . carvedilol  12.5 mg Oral BID WC  . lisinopril  40 mg Oral Daily  . multivitamin with minerals  1 tablet Oral Daily  . triamcinolone cream  1 application Topical QHS   Continuous Infusions: . diltiazem (CARDIZEM) infusion 12.5 mg/hr (12/06/17 0404)   PRN Meds: acetaminophen, nitroGLYCERIN, ondansetron (ZOFRAN) IV   Vital Signs    Vitals:   12/05/17 2000 12/06/17 0000 12/06/17 0400 12/06/17 0421  BP:  108/74 109/77   Pulse: 79 73 70   Resp: (!) 35 (!) 26 18   Temp:  98.1 F (36.7 C) 98.3 F (36.8 C)   TempSrc:   Oral   SpO2: 94% 97%    Weight:    162 lb 9.6 oz (73.8 kg)  Height:        Intake/Output Summary (Last 24 hours) at 12/06/2017 1047 Last data filed at 12/06/2017 0300 Gross per 24 hour  Intake 703.75 ml  Output 200 ml  Net 503.75 ml   Filed Weights   12/04/17 1529 12/05/17 0455 12/06/17 0421  Weight: 162 lb (73.5 kg) 160 lb 14.4 oz (73 kg) 162 lb 9.6 oz (73.8 kg)    Telemetry    Atrial flutter with controlled rate.  - Personally Reviewed  ECG    NA - Personally Reviewed  Physical Exam   GEN: No  acute distress.   Neck: No  JVD Cardiac: Irregular RR, no murmurs, rubs, or gallops.  Respiratory: Decreased breath sounds bilaterally. GI: Soft, nontender, non-distended, normal bowel sounds  MS:  No edema; No deformity. Neuro:   Nonfocal  Psych: Oriented and appropriate    Labs    Chemistry Recent Labs  Lab 12/04/17 1536 12/05/17 0234  NA 140 142  K 4.6 3.7  CL 109 103  CO2 23 27  GLUCOSE 113* 102*  BUN 14 12  CREATININE 1.11* 1.12*  CALCIUM 9.2 9.2  GFRNONAA 53*  53*  GFRAA >60 >60  ANIONGAP 8 12     Hematology Recent Labs  Lab 12/04/17 1536 12/05/17 0234 12/06/17 0339  WBC 6.9 6.8 6.0  RBC 4.11 4.16 4.08  HGB 12.3 12.5 12.4  HCT 39.0 39.3 39.6  MCV 94.9 94.5 97.1  MCH 29.9 30.0 30.4  MCHC 31.5 31.8 31.3  RDW 15.8* 16.2* 16.3*  PLT 202 218 205    Cardiac EnzymesNo results for input(s): TROPONINI in the last 168 hours.  Recent Labs  Lab 12/04/17 1849  TROPIPOC 0.13*     BNP Recent Labs  Lab 12/04/17 1536  BNP 739.8*     DDimer No results for input(s): DDIMER in the last 168 hours.   Radiology    Dg Chest 2 View  Result Date: 12/04/2017 CLINICAL DATA:  Chest pain and shortness of breath EXAM: CHEST - 2 VIEW COMPARISON:  Chest radiograph 10/13/2015. FINDINGS: Monitoring leads overlie the patient. Marked enlargement cardiopericardial silhouette. Patient status post median sternotomy and CABG procedure. Mild retrocardiac consolidation. No pleural effusion or pneumothorax. Thoracic spine degenerative changes. IMPRESSION: Marked enlargement of  the cardiopericardial silhouette which may represent cardiomegaly. Pericardial effusion not excluded. Heterogeneous opacities left lung base may represent atelectasis. Electronically Signed   By: Annia Belt M.D.   On: 12/04/2017 16:13   Ct Angio Chest Pe W And/or Wo Contrast  Result Date: 12/04/2017 CLINICAL DATA:  57 year old female with acute shortness of breath and chest pain. EXAM: CT ANGIOGRAPHY CHEST WITH CONTRAST TECHNIQUE: Multidetector CT imaging of the chest was performed using the standard protocol during bolus administration of intravenous contrast. Multiplanar CT image reconstructions and MIPs were obtained to evaluate the vascular anatomy. CONTRAST:  ISOVUE-370 IOPAMIDOL (ISOVUE-370) INJECTION 76% COMPARISON:  12/04/2017 and prior chest radiographs. 03/25/2006 chest CT FINDINGS: Cardiovascular: This is a technically satisfactory study. No pulmonary emboli are identified.  Cardiomegaly, CABG and mitral valve replacement changes noted. Thoracic aortic atherosclerotic calcifications identified without aneurysm. No pericardial effusion. Mediastinum/Nodes: No enlarged mediastinal, hilar, or axillary lymph nodes. Thyroid gland, trachea, and esophagus demonstrate no significant findings. Lungs/Pleura: Mild interlobular septal thickening is identified, RIGHT greater than LEFT and may represent mild interstitial edema. A small RIGHT pleural effusion is present. Mild bibasilar atelectasis identified. Subcentimeter nodules within the LEFT UPPER lobe and RIGHT LOWER lobe are unchanged-benign. No mass or pneumothorax. Upper Abdomen: No acute abnormality Musculoskeletal: No acute or suspicious bony abnormalities. Review of the MIP images confirms the above findings. IMPRESSION: 1. No evidence of pulmonary emboli 2. Mild interlobular septal thickening, RIGHT greater than LEFT, which may represent mild interstitial edema. 3. Small RIGHT pleural effusion 4. Cardiomegaly, CABG and mitral valve replacement changes. 5.  Aortic Atherosclerosis (ICD10-I70.0). Electronically Signed   By: Harmon Pier M.D.   On: 12/04/2017 18:49    Cardiac Studies   ECHO:    Study Conclusions  - Left ventricle: False tendon in mid LV cavity of no clinical   significance. The cavity size was mildly dilated. There was   moderate asymmetric hypertrophy. Systolic function was severely   reduced. The estimated ejection fraction was in the range of 25%   to 30%. Severe diffuse hypokinesis with distinct regional wall   motion abnormalities. There is akinesis of the inferolateral   myocardium. The study was not technically sufficient to allow   evaluation of LV diastolic dysfunction due to atrial   fibrillation. Doppler parameters are consistent with high   ventricular filling pressure. - Ventricular septum: The contour showed diastolic flattening and   systolic flattening. These changes are consistent with RV  volume   and pressure overload. - Aortic valve: There was trivial regurgitation. - Mitral valve: Mobility of the posterior leaflet was restricted to   the point of immobility. The findings are consistent with   moderate stenosis. There was mild regurgitation directed   eccentrically. Mean gradient (D): 9 mm Hg. Valve area by   continuity equation (using LVOT flow): 1.02 cm^2. - Left atrium: The atrium was severely dilated. - Right ventricle: The cavity size was mildly dilated. Wall   thickness was normal. Systolic function was moderately reduced. - Pulmonary arteries: PA peak pressure: 59 mm Hg (S).    Patient Profile     57 y.o. female / history of HTN, HLD, HFrEF, and CAD s/p CABG 2007 w/ subsequent PCI to SVG in 2017 for NSTEMI who presents w/ chest pain, found to have NSTEMI. The patient is also in acute decompensated heart failure as well as atrial flutter.   Assessment & Plan    ATRIAL FLUTTER:  Rate is now controlled on Cardizem.  TEE DCCV is  deferred secondary to anesthesia until tomorrow AM.  Continue IV Cardizem today.    NSTEMI:    EF is as above and not substantially change from previous.  I personally  reviewed the echo images and compared them to 2017.  I do not think that there has been any significant change.    ACUTE ON CHRONIC SYSTOLIC HF:   I will titrate meds with increased beta blocker before discharge once the rhythm is managed and Cardizem off.   I will also transition to [facility] and stop the     For questions or updates, please contact [facility] Please consult [email or web address] for contact info under Cardiology/STEMI.   Signed, Rollene Rotunda, MD  12/06/2017, 10:47 AM

## 2017-12-07 ENCOUNTER — Inpatient Hospital Stay (HOSPITAL_COMMUNITY): Payer: 59 | Admitting: Anesthesiology

## 2017-12-07 ENCOUNTER — Inpatient Hospital Stay (HOSPITAL_COMMUNITY)
Admit: 2017-12-07 | Discharge: 2017-12-07 | Disposition: A | Discharge status: Home / Self Care | Payer: 59 | Attending: Internal Medicine | Admitting: Internal Medicine

## 2017-12-07 ENCOUNTER — Encounter (HOSPITAL_COMMUNITY)
Admission: EM | Disposition: A | Discharge status: Home / Self Care | Payer: Self-pay | Source: Home / Self Care | Attending: Cardiology

## 2017-12-07 ENCOUNTER — Encounter (HOSPITAL_COMMUNITY): Payer: Self-pay | Admitting: *Deleted

## 2017-12-07 DIAGNOSIS — I342 Nonrheumatic mitral (valve) stenosis: Secondary | ICD-10-CM

## 2017-12-07 HISTORY — PX: TEE WITHOUT CARDIOVERSION: SHX5443

## 2017-12-07 LAB — CBC
HCT: 38.8 % (ref 36.0–46.0)
Hemoglobin: 12.2 g/dL (ref 12.0–15.0)
MCH: 30 pg (ref 26.0–34.0)
MCHC: 31.4 g/dL (ref 30.0–36.0)
MCV: 95.3 fL (ref 78.0–100.0)
PLATELETS: 213 10*3/uL (ref 150–400)
RBC: 4.07 MIL/uL (ref 3.87–5.11)
RDW: 16 % — ABNORMAL HIGH (ref 11.5–15.5)
WBC: 6.9 10*3/uL (ref 4.0–10.5)

## 2017-12-07 LAB — BASIC METABOLIC PANEL
Anion gap: 11 (ref 5–15)
BUN: 12 mg/dL (ref 6–20)
CO2: 26 mmol/L (ref 22–32)
Calcium: 9.3 mg/dL (ref 8.9–10.3)
Chloride: 106 mmol/L (ref 101–111)
Creatinine, Ser: 1.03 mg/dL — ABNORMAL HIGH (ref 0.44–1.00)
GFR, EST NON AFRICAN AMERICAN: 58 mL/min — AB (ref >60)
Glucose, Bld: 105 mg/dL — ABNORMAL HIGH (ref 65–99)
POTASSIUM: 4 mmol/L (ref 3.5–5.1)
SODIUM: 143 mmol/L (ref 135–145)

## 2017-12-07 SURGERY — ECHOCARDIOGRAM, TRANSESOPHAGEAL
Anesthesia: Monitor Anesthesia Care

## 2017-12-07 MED ORDER — PROPOFOL 500 MG/50ML IV EMUL
INTRAVENOUS | Status: DC | PRN
  Administered 2017-12-07: 200 ug/kg/min via INTRAVENOUS

## 2017-12-07 MED ORDER — LIDOCAINE 2% (20 MG/ML) 5 ML SYRINGE
INTRAMUSCULAR | Status: DC | PRN
  Administered 2017-12-07: 80 mg via INTRAVENOUS

## 2017-12-07 MED ORDER — METOPROLOL TARTRATE 25 MG PO TABS
25.0000 mg | ORAL_TABLET | Freq: Three times a day (TID) | ORAL | Status: DC
  Administered 2017-12-07 – 2017-12-08 (×3): 25 mg via ORAL
  Filled 2017-12-07 (×3): qty 1

## 2017-12-07 MED ORDER — PHENYLEPHRINE 40 MCG/ML (10ML) SYRINGE FOR IV PUSH (FOR BLOOD PRESSURE SUPPORT)
PREFILLED_SYRINGE | INTRAVENOUS | Status: DC | PRN
  Administered 2017-12-07: 80 ug via INTRAVENOUS

## 2017-12-07 MED ORDER — PROPOFOL 10 MG/ML IV BOLUS
INTRAVENOUS | Status: DC | PRN
  Administered 2017-12-07 (×2): 20 mg via INTRAVENOUS

## 2017-12-07 MED ORDER — BUTAMBEN-TETRACAINE-BENZOCAINE 2-2-14 % EX AERO
INHALATION_SPRAY | CUTANEOUS | Status: DC | PRN
  Administered 2017-12-07: 2 via TOPICAL

## 2017-12-07 MED ORDER — EPHEDRINE SULFATE-NACL 50-0.9 MG/10ML-% IV SOSY
PREFILLED_SYRINGE | INTRAVENOUS | Status: DC | PRN
  Administered 2017-12-07 (×3): 10 mg via INTRAVENOUS
  Administered 2017-12-07: 5 mg via INTRAVENOUS

## 2017-12-07 MED ORDER — SODIUM CHLORIDE 0.9 % IV SOLN
INTRAVENOUS | Status: DC
  Administered 2017-12-07: 11:00:00 via INTRAVENOUS

## 2017-12-07 NOTE — H&P (Signed)
   INTERVAL PROCEDURE H&P  History and Physical Interval Note:  12/07/2017 11:34 AM  [name] has presented today for their planned procedure. The various methods of treatment have been discussed with the patient and family. After consideration of risks, benefits and other options for treatment, the patient has consented to the procedure.  The patients' outpatient history has been reviewed, patient examined, and no change in status from most recent office note within the past 30 days. I have reviewed the patients' chart and labs and will proceed as planned. Questions were answered to the patient's satisfaction.   Chrystie Nose, MD, [facility], FACP  [place]  [facility]  Medical Director of the [facility]  [facility] Diplomate of the American Board of Clinical Lipidology Attending Cardiologist  Direct Dial: [phone]  Fax: [phone]  Website:  [email or web address] Nicely Derwood Becraft 12/07/2017, 11:34 AM

## 2017-12-07 NOTE — Anesthesia Preprocedure Evaluation (Addendum)
Anesthesia Evaluation  Patient identified by MRN, date of birth, ID band Patient awake    Reviewed: Allergy & Precautions, H&P , NPO status , Patient's Chart, lab work & pertinent test results, reviewed documented beta blocker date and time   Airway Mallampati: II  TM Distance: >3 FB Neck ROM: Full    Dental no notable dental hx. (+) Edentulous Upper, Partial Lower, Dental Advisory Given   Pulmonary neg pulmonary ROS, former smoker,    Pulmonary exam normal breath sounds clear to auscultation       Cardiovascular hypertension, Pt. on medications and Pt. on home beta blockers + CAD, + Past MI, + Cardiac Stents, + CABG and +CHF  + dysrhythmias Atrial Fibrillation + Valvular Problems/Murmurs  Rhythm:Irregular Rate:Normal     Neuro/Psych negative neurological ROS  negative psych ROS   GI/Hepatic negative GI ROS, Neg liver ROS,   Endo/Other  negative endocrine ROS  Renal/GU negative Renal ROS  negative genitourinary   Musculoskeletal   Abdominal   Peds  Hematology negative hematology ROS (+)   Anesthesia Other Findings   Reproductive/Obstetrics negative OB ROS                            Anesthesia Physical Anesthesia Plan  ASA: III  Anesthesia Plan: General   Post-op Pain Management:    Induction: Intravenous  PONV Risk Score and Plan: 3 and Propofol infusion and Treatment may vary due to age or medical condition  Airway Management Planned: Nasal Cannula  Additional Equipment:   Intra-op Plan:   Post-operative Plan:   Informed Consent: I have reviewed the patients History and Physical, chart, labs and discussed the procedure including the risks, benefits and alternatives for the proposed anesthesia with the patient or authorized representative who has indicated his/her understanding and acceptance.   Dental advisory given  Plan Discussed with: CRNA  Anesthesia Plan Comments:          Anesthesia Quick Evaluation

## 2017-12-07 NOTE — Progress Notes (Signed)
Patient returned to unit from having TEE procedure. Patient is alert and oriented and VS are stable, 108/74, 65 HR, 23 RR, 100% O2 on 2L [place]. Patient denies any pain. She is resting comfortably in bed. Will continue to monitor patient.

## 2017-12-07 NOTE — Progress Notes (Addendum)
Progress Note  Patient Name: [name] Date of Encounter: 12/07/2017  Primary Cardiologist: Lesleigh Noe, MD   Subjective   Reports improvement in chest pain. Continues to have some SOB. Denies palpitations.   Inpatient Medications    Scheduled Meds: . apixaban  5 mg Oral BID  . aspirin EC  81 mg Oral Daily  . atorvastatin  80 mg Oral Daily  . carvedilol  12.5 mg Oral BID WC  . multivitamin with minerals  1 tablet Oral Daily  . sacubitril-valsartan  1 tablet Oral BID  . sodium chloride flush  3 mL Intravenous Q12H  . triamcinolone cream  1 application Topical QHS   Continuous Infusions: . sodium chloride    . diltiazem (CARDIZEM) infusion 10 mg/hr (12/07/17 0439)   PRN Meds: acetaminophen, nitroGLYCERIN, ondansetron (ZOFRAN) IV, sodium chloride flush   Vital Signs    Vitals:   12/06/17 1155 12/06/17 1952 12/07/17 0003 12/07/17 0419  BP: (!) 142/108 126/90 114/78 (!) 105/91  Pulse: 88 82 66 69  Resp: Temp:  98.4 F (36.9 C) 98.1 F (36.7 C) 97.9 F (36.6 C)  TempSrc:  Oral Oral Oral  SpO2:  96% 96% 100%  Weight:    161 lb 8 oz (73.3 kg)  Height:       No intake or output data in the 24 hours ending 12/07/17 1002 Filed Weights   12/05/17 0455 12/06/17 0421 12/07/17 0419  Weight: 160 lb 14.4 oz (73 kg) 162 lb 9.6 oz (73.8 kg) 161 lb 8 oz (73.3 kg)    Telemetry    Atrial flutter with controlled ventricular rate - Personally Reviewed   Physical Exam   GEN: Laying in bed in no acute distress.   Neck: No JVD, no carotid bruits Cardiac: IRRR, no murmurs, rubs, or gallops.  Respiratory: decreased breath sounds bilaterally with mild crackles at lung bases GI: NABS, Soft, nontender, non-distended  MS: No edema; No deformity. Neuro:  Nonfocal, moving all extremities spontaneously Psych: Normal affect   Labs    Chemistry Recent Labs  Lab 12/04/17 1536 12/05/17 0234 12/07/17 0310  NA 140 142 143  K 4.6 3.7 4.0  CL 109 103 106    CO2 GLUCOSE 113* 102* 105*  BUN CREATININE 1.11* 1.12* 1.03*  CALCIUM 9.2 9.2 9.3  GFRNONAA 53* 53* 58*  GFRAA >60 >60 >60  ANIONGAP Hematology Recent Labs  Lab 12/05/17 0234 12/06/17 0339 12/07/17 0310  WBC 6.8 6.0 6.9  RBC 4.16 4.08 4.07  HGB 12.5 12.4 12.2  HCT 39.3 39.6 38.8  MCV 94.5 97.1 95.3  MCH 30.0 30.4 30.0  MCHC 31.8 31.3 31.4  RDW 16.2* 16.3* 16.0*  PLT 218 205 213    Cardiac EnzymesNo results for input(s): TROPONINI in the last 168 hours.  Recent Labs  Lab 12/04/17 1849  TROPIPOC 0.13*     BNP Recent Labs  Lab 12/04/17 1536  BNP 739.8*     DDimer No results for input(s): DDIMER in the last 168 hours.   Radiology    No results found.  Cardiac Studies   Echocardiogram 12/05/17: Study Conclusions  - Left ventricle: False tendon in mid LV cavity of no clinical   significance. The cavity size was mildly dilated. There was   moderate asymmetric hypertrophy. Systolic function was severely   reduced. The estimated ejection fraction was in the range of  25%   to 30%. Severe diffuse hypokinesis with distinct regional wall   motion abnormalities. There is akinesis of the inferolateral   myocardium. The study was not technically sufficient to allow   evaluation of LV diastolic dysfunction due to atrial   fibrillation. Doppler parameters are consistent with high   ventricular filling pressure. - Ventricular septum: The contour showed diastolic flattening and   systolic flattening. These changes are consistent with RV volume   and pressure overload. - Aortic valve: There was trivial regurgitation. - Mitral valve: Mobility of the posterior leaflet was restricted to   the point of immobility. The findings are consistent with   moderate stenosis. There was mild regurgitation directed   eccentrically. Mean gradient (D): 9 mm Hg. Valve area by   continuity equation (using LVOT flow): 1.02 cm^2. - Left atrium: The  atrium was severely dilated. - Right ventricle: The cavity size was mildly dilated. Wall   thickness was normal. Systolic function was moderately reduced. - Pulmonary arteries: PA peak pressure: 59 mm Hg (S).  Impressions:  - The right ventricular systolic pressure was increased consistent   with moderate pulmonary hypertension.   Patient Profile     57 y.o. 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 Hospital course complicated by acute on chronic CHF and atrial flutter.  Left heart catheterization 09/2015: Conclusion   1. Mid Graft lesion in SVG-dRCA , 95% stenosed. Post protected intervention Synergy DES 4.0 mm x 60 mm (4.6 m) , there is a 0% residual stenosis. 2. Mid RCA lesion, 100% stenosed. Mid RCA to Dist RCA lesion, 45% stenosed - at the anastomosis. Minimal disease downstream. 3. Prox Cx lesion, 70% stenosed. Prox Cx to Mid Cx lesion, 100% stenosed. 1st Mrg lesion, 65% stenosed. 4. Prox LAD to Mid LAD lesion, 100% stenosed. - Fills the widely patent LIMA graft 5. SVG-DIAG widely patent. Antegrade flow fills a moderate caliber diagonal vessel. Retrograde flow fills into the LAD, perfusing the SP1 with competitive flow in the LAD 6. Origin to Prox SVG-OM Graft lesion, 100% stenosed. 7. There is moderate to severe left ventricular systolic dysfunction.    Initial post CABG angiography reveals widely patent LIMA graft to a relatively normal distal LAD as well as widely patent vein graft to moderate size D1. The native circumflex that was noted to be occluded is still occluded and the graft to this vessel is also occluded. The native RCA is now 100% occluded in the mid vessel with severe disease in the vein graft to the distal RCA.  Successful PCI of the  large caliber vein graft to the RCA with a DES stent. There is some mild-moderate disease at the anastomosis.  Plan:  Transfer to 6 Central Post Procedure Unit for standard post radial PCI care. TR band removal per protocol.  Oral Brilinta plus aspirin for minimum of 3 months. At that time aspirin can be suspended with Brilinta monotherapy.  Continue aggressive risk factor modification with blood pressure and lipid control.  Would recommend 2-D echocardiogram to better assess the LVEF and consider addition of standing diuretic as well as increased afterload reduction.  If stable, would anticipate discharge in the morning.     Assessment & Plan    1. Atrial flutter with RVR: has  known history of atrial fibrillation, however no documented EKGs with Afib dating back to 2017. Not on anticoagulation currently; has been on coumadin in the past but discontinued for unknown reason. She was started on IV diltiazem for rate control and eliquis for CHADS2Vasc score of 4 (Vasc hx, CHF, HTN, and female). - Continue IV diltiazem and po metoprolol for now - Plan for TEE/DCCV today at 12:00pm with Dr. Rennis Golden  2. NSTEMI in patient with CAD s/p CABG and subsequent PCI: p/w chest pain and found to be in atrial flutter with RVR. EKG non-ischemic. Trop 0.12>0.13. Suspect elevation is 2/2 demand ischemia in the setting of RVR. Echo with EF 25-30% (thought to be no significant change from previous after review by Dr. Antoine Poche) - Further ischemic evaluation pending response to cardioversion  3. Acute on chronic combined CHF: echo this admission with EF 25-30% with diffuse hypokinesis, and increased RV systolic pressure consistent with moderate pulmHTN; unable to assess LV diastolic function due to aflutter. Lisinopril discontinued and she was started on entresto.  - Continue coreg at higher dose and entresto - May benefit from adding spironolactone if BP stable post cardioversion - Anticipate restarting po  lasix post cardioversion.   For questions or updates, please contact [facility] Please consult [email or web address] for contact info under Cardiology/STEMI.      Signed, Beatriz Stallion, PA-C  12/07/2017, 10:02 AM   [phone]   History and all data above reviewed.  Patient examined.  I agree with the findings as above.  The patient had a run of NSVT in the Endo suite.  She could not have cardioversion because of LA thrombus.   The patient exam reveals [id number]  ,  Lungs: Clear  ,  Abd: Positive bowel sounds, no rebound no guarding, Ext No edema  .  All available labs, radiology testing, previous records reviewed. Agree with documented assessment and plan. Atrial flutter:  She will need to be on anticoagulation for 3 weeks before attempted cardioversion.  Until then, I will change to metoprolol for both treatment of this and her CHF.  She needs adequate med titration. Ultimately she will need to be considered for an ICD but I would defer this decision until meds have been titrated and possibly sinus rhythm restored.    Fayrene Fearing Edmond Ginsberg  1:06 PM  12/07/2017

## 2017-12-07 NOTE — Anesthesia Postprocedure Evaluation (Signed)
Anesthesia Post Note  Patient: Annya Lizana  Procedure(s) Performed: TRANSESOPHAGEAL ECHOCARDIOGRAM (TEE) (N/A )     Patient location during evaluation: PACU Anesthesia Type: MAC Level of consciousness: awake and alert Pain management: pain level controlled Vital Signs Assessment: post-procedure vital signs reviewed and stable Respiratory status: spontaneous breathing, nonlabored ventilation, respiratory function stable and patient connected to nasal cannula oxygen Cardiovascular status: stable and blood pressure returned to baseline Postop Assessment: no apparent nausea or vomiting Anesthetic complications: no    Last Vitals:  Vitals:   12/07/17 1250 12/07/17 1300  BP: 109/62 103/71  Pulse: 62 (!) 51  Resp: (!) 28 (!) 24  Temp:    SpO2: 94% 94%    Last Pain:  Vitals:   12/07/17 1300  TempSrc:   PainSc: 0-No pain                 Mattalynn Crandle,W. EDMOND

## 2017-12-07 NOTE — Transfer of Care (Signed)
Immediate Anesthesia Transfer of Care Note  Patient: [name]  Procedure(s) Performed: TRANSESOPHAGEAL ECHOCARDIOGRAM (TEE) (N/A )  Patient Location: Endoscopy Unit  Anesthesia Type:MAC  Level of Consciousness: drowsy  Airway & Oxygen Therapy: Patient Spontanous Breathing and Patient connected to face mask oxygen  Post-op Assessment: Report given to RN and Post -op Vital signs reviewed and stable  Post vital signs: Reviewed and stable  Last Vitals:  Vitals Value Taken Time  BP 92/49 12/07/2017 12:27 PM  Temp    Pulse 57 12/07/2017 12:27 PM  Resp 21 12/07/2017 12:27 PM  SpO2 100 % 12/07/2017 12:27 PM  Vitals shown include unvalidated device data.  Last Pain:  Vitals:   12/07/17 1053  TempSrc: Oral  PainSc: 0-No pain         Complications: No apparent anesthesia complications

## 2017-12-07 NOTE — Anesthesia Procedure Notes (Signed)
Procedure Name: MAC Date/Time: 12/07/2017 12:04 PM Performed by: Imagene Riches, CRNA Pre-anesthesia Checklist: Patient identified, Emergency Drugs available, Suction available and Patient being monitored Patient Re-evaluated:Patient Re-evaluated prior to induction Oxygen Delivery Method: Nasal cannula Preoxygenation: Pre-oxygenation with 100% oxygen

## 2017-12-07 NOTE — CV Procedure (Signed)
TRANSESOPHAGEAL ECHOCARDIOGRAM (TEE) NOTE  INDICATIONS: atrial flutter  PROCEDURE:   Informed consent was obtained prior to the procedure. The risks, benefits and alternatives for the procedure were discussed and the patient comprehended these risks.  Risks include, but are not limited to, cough, sore throat, vomiting, nausea, somnolence, esophageal and stomach trauma or perforation, bleeding, low blood pressure, aspiration, pneumonia, infection, trauma to the teeth and death.    After a procedural time-out, the patient was given propofol per anesthesia for sedation.  The patient's heart rate, blood pressure, and oxygen saturation are monitored continuously during the procedure.The oropharynx was anesthetized with 2 topical cetacaine sprays.  The transesophageal probe was inserted in the esophagus and stomach without difficulty and multiple views were obtained.  The patient was kept under observation until the patient left the procedure room. The patient left the procedure room in stable condition.   Agitated microbubble saline contrast was not administered.  COMPLICATIONS:    There were no immediate complications.  Findings:  1. LEFT VENTRICLE: The left ventricular wall thickness is moderately increased.  The left ventricular cavity is moderately dilated in size. Wall motion is severely hypokinetic.  LVEF is 25-30%.  2. RIGHT VENTRICLE:  The right ventricle is mildly dilated with reduced systolic function.    3. LEFT ATRIUM:  The left atrium is severely dilated in size without any thrombus or masses.  There is spontaneous echo contrast ("smoke") in the left atrium consistent with a low flow state.  4. LEFT ATRIAL APPENDAGE:  The left atrial appendage is noted to have a thrombus at the apex of the appendage. The appendage has single lobes. Pulse doppler indicates very low flow in the appendage.  5. ATRIAL SEPTUM:  The atrial septum appears intact and is free of thrombus and/or masses.   There is no evidence for interatrial shunting by color doppler.  6. RIGHT ATRIUM:  The right atrium is dilated without any thrombus or masses.  7. MITRAL VALVE:  The mitral valve has been repaired with an annuloplasty ring. This was visualized in 2D and 3D modes and demonstrates mild to moderate stenosis (with poor excursion of the posterior leaflet) and Mild regurgitation.  There were no vegetations.  8. AORTIC VALVE:  The aortic valve is trileaflet with leaflet thickening and Mild regurgitation.  There were no vegetations or stenosis.  9. TRICUSPID VALVE:  The tricuspid valve is normal in structure and function with Moderate regurgitation.  There were no vegetations or stenosis  10.  PULMONIC VALVE:  The pulmonic valve is normal in structure and function with trivial regurgitation.  There were no vegetations or stenosis.   11. AORTIC ARCH, ASCENDING AND DESCENDING AORTA:  Poorly visualized.  12. PULMONARY VEINS: Anomalous pulmonary venous return was not noted.  13. PERICARDIUM: The pericardium appeared normal and non-thickened.  There is no pericardial effusion.  IMPRESSION:   1. 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 2. Negative for PFO by color doppler 3. S/p Mitral annuloplasty repair with mild to moderate stenosis and regurgitation and poor posterior leaflet excursion. 4. Mild central AI 5. Moderate TR 6. Severe LAE with spontaneous echo contrast (Smoke), suggestive of a low-flow state 7. Mild RV dysfunction 8. Severe LV dysfunction, LVEF 25-30%  RECOMMENDATIONS:    1. Recommend an additional 3-4 weeks of anticoagulation and repeat TEE to demonstrate resolution of thrombus prior to repeat cardioversion attempt 2. S/p annuloplasty repair with mild to moderate mitral stenosis and mild regurgitation. 3. Severe LAE and low LVEF predict high risk  of recurrent arryhthmias - consider initiating antiarrythmic therapy prior to repeat cardioversion attempt to improve conversion success. 4. The  patient was noted to have an 8 beat run of NSVT prior to the procedure - Dr. Antoine Poche is aware  Time Spent Directly with the Patient:  60 minutes   Chrystie Nose, MD, [facility], FACP  [place]  [facility]  Medical Director of the [facility] Diplomate of the American Board of Clinical Lipidology Attending Cardiologist  Direct Dial: [phone]  Fax: [phone]  Website:  [email or web address] 12/07/2017, 12:22 PM

## 2017-12-07 NOTE — Progress Notes (Signed)
Patient has 12 beat run of vtach while in preprocedure in endoscopy. Alert and oriented, patient states feels unchanged during this time. Dr. Rennis Golden Notified.

## 2017-12-08 ENCOUNTER — Encounter (HOSPITAL_COMMUNITY): Payer: Self-pay | Admitting: Internal Medicine

## 2017-12-08 LAB — CBC
HCT: 40.1 % (ref 36.0–46.0)
Hemoglobin: 12.5 g/dL (ref 12.0–15.0)
MCH: 29.8 pg (ref 26.0–34.0)
MCHC: 31.2 g/dL (ref 30.0–36.0)
MCV: 95.7 fL (ref 78.0–100.0)
PLATELETS: 223 10*3/uL (ref 150–400)
RBC: 4.19 MIL/uL (ref 3.87–5.11)
RDW: 16 % — AB (ref 11.5–15.5)
WBC: 6.2 10*3/uL (ref 4.0–10.5)

## 2017-12-08 MED ORDER — DIGOXIN 125 MCG PO TABS
0.2500 mg | ORAL_TABLET | Freq: Every day | ORAL | Status: DC
  Administered 2017-12-08 – 2017-12-13 (×6): 0.25 mg via ORAL
  Filled 2017-12-08 (×6): qty 2

## 2017-12-08 MED ORDER — HEPARIN (PORCINE) IN NACL 100-0.45 UNIT/ML-% IJ SOLN
850.0000 [IU]/h | INTRAMUSCULAR | Status: DC
  Administered 2017-12-08: 1000 [IU]/h via INTRAVENOUS
  Filled 2017-12-08: qty 250

## 2017-12-08 MED ORDER — METOPROLOL TARTRATE 25 MG PO TABS
25.0000 mg | ORAL_TABLET | Freq: Four times a day (QID) | ORAL | Status: AC
  Administered 2017-12-08 – 2017-12-09 (×7): 25 mg via ORAL
  Filled 2017-12-08 (×7): qty 1

## 2017-12-08 NOTE — Progress Notes (Signed)
ANTICOAGULATION CONSULT NOTE    Pharmacy Consult for Eliquis >>> Bridging with Heparin Indication: atrial fibrillation / LAA Thrombus  Allergies  Allergen Reactions  . Other Itching    GEL ON ELECTRODES FOR EKG   Patient Measurements: Height:  (157.5 cm) Weight: 161 lb 3.2 oz (73.1 kg) IBW/kg (Calculated) : 50.1  Vital Signs: Temp: 96.7 F (35.9 C) (05/23 0908) Temp Source: Axillary (05/23 0908) BP: 121/89 (05/23 0908) Pulse Rate: 132 (05/23 0908)  Labs: Recent Labs    12/06/17 0339 12/07/17 0310 12/08/17 0340  HGB 12.4 12.2 12.5  HCT 39.6 38.8 40.1  PLT 205 213 223  CREATININE  --  1.03*  --    Estimated Creatinine Clearance: 55.1 mL/min (A) (by C-G formula based on SCr of 1.03 mg/dL (H)).  Medical History: Past Medical History:  Diagnosis Date  . Atrial fibrillation (HCC)   . CHF (congestive heart failure) (HCC)   . Coronary atherosclerosis of native coronary artery   . HF (heart failure) (HCC)    with LVEF 40% 2008, Echo, functional class 2.  . HTN (hypertension)   . Hx of CABG    LIMA-LAD, SVG-DIAG, SVG-OM, SVG-RCA  . Hypercholesteremia   . Myocardial infarct (HCC) 2007  . NSTEMI (non-ST elevated myocardial infarction) (HCC) 10/13/2015  . Obesity     Medications:  Medications Prior to Admission  Medication Sig Dispense Refill Last Dose  . acetaminophen (TYLENOL) 500 MG tablet Take 1,000 mg by mouth every 6 (six) hours as needed for headache (pain).   12/02/2017  . aspirin EC 81 MG tablet Take 1 tablet (81 mg total) by mouth daily. 90 tablet 3 12/02/2017  . atorvastatin (LIPITOR) 80 MG tablet TAKE 1 TABLET (80 MG TOTAL) BY MOUTH DAILY AT 6 PM. (Patient taking differently: Take 80 mg by mouth daily. ) 30 tablet 8 12/02/2017 at am  . carvedilol (COREG) 12.5 MG tablet TAKE 1 TABLET (12.5 MG TOTAL) BY MOUTH 2 (TWO) TIMES DAILY WITH A MEAL. 60 tablet 12 12/04/2017 at 1300  . clopidogrel (PLAVIX) 75 MG tablet Take 1 tablet (75 mg total) by mouth daily. 90  tablet 3 12/02/2017 at am  . furosemide (LASIX) 40 MG tablet Take 1 tablet (40 mg total) by mouth daily. 90 tablet 2 12/02/2017 at am  . lisinopril (PRINIVIL,ZESTRIL) 40 MG tablet Take 1 tablet (40 mg total) by mouth daily. 90 tablet 3 12/04/2017 at 1300  . Multiple Vitamin (MULTIVITAMIN WITH MINERALS) TABS tablet Take 1 tablet by mouth daily.   12/02/2017  . triamcinolone cream (KENALOG) 0.1 % Apply 1 application topically at bedtime.   12/03/2017 at pm  . nitroGLYCERIN (NITROSTAT) 0.4 MG SL tablet Place 0.4 mg under the tongue every 5 (five) minutes as needed for chest pain (up to 3 doses before calling EMS).    6 months ago    Assessment: 57 year old female found to have LAA thrombus.  We have been asked to restart IV heparin for cardiac cath procedure tomorrow.  CBC stable, no issues reported.  Goal of Therapy:  HL 0.3-0.7 Monitor platelets by anticoagulation protocol: Yes   Plan:  Patient had dose of Apixaban this morning ~ 9AM.  Will start heparin ~ 10-12 hours after her dose at her previous dosing of 1000 units/hr.   Will check AM levels and adjust accordingly.  Nadara Mustard, PharmD., MS Clinical Pharmacist Pager:  [phone] Thank you for allowing pharmacy to be part of this patients care team. 12/08/2017,12:37 PM

## 2017-12-08 NOTE — H&P (View-Only) (Signed)
Progress Note  Patient Name: [name] Date of Encounter: 12/08/2017  Primary Cardiologist:   Lesleigh Noe, MD   Subjective   She has some weakness but no pain.  No SOB.  She feels the heart racing.    Inpatient Medications    Scheduled Meds: . apixaban  5 mg Oral BID  . aspirin EC  81 mg Oral Daily  . atorvastatin  80 mg Oral Daily  . metoprolol tartrate  25 mg Oral TID  . multivitamin with minerals  1 tablet Oral Daily  . sacubitril-valsartan  1 tablet Oral BID  . sodium chloride flush  3 mL Intravenous Q12H  . triamcinolone cream  1 application Topical QHS   Continuous Infusions: . sodium chloride     PRN Meds: acetaminophen, nitroGLYCERIN, ondansetron (ZOFRAN) IV, sodium chloride flush   Vital Signs    Vitals:   12/07/17 2017 12/08/17 0037 12/08/17 0448 12/08/17 0908  BP: 111/86 (!) 131/94 (!) 126/92 121/89  Pulse: (!) 126 (!) 125 (!) 130 (!) 132  Resp: (!) 33 (!) 26  (!) 36  Temp: 97.9 F (36.6 C) 98.3 F (36.8 C) 98.1 F (36.7 C) (!) 96.7 F (35.9 C)  TempSrc: Oral Oral Oral Axillary  SpO2: 100% (!) 88% 98% 96%  Weight:   161 lb 3.2 oz (73.1 kg)   Height:        Intake/Output Summary (Last 24 hours) at 12/08/2017 1123 Last data filed at 12/07/2017 2048 Gross per 24 hour  Intake 600 ml  Output -  Net 600 ml   Filed Weights   12/07/17 0419 12/07/17 1053 12/08/17 0448  Weight: 161 lb 8 oz (73.3 kg) 161 lb 8 oz (73.3 kg) 161 lb 3.2 oz (73.1 kg)    Telemetry    Atrial flutter with rapid rate - Personally Reviewed  ECG    NA - Personally Reviewed  Physical Exam   GEN: No acute distress.   Neck: No  JVD Cardiac: Irregular RR, no murmurs, rubs, or gallops.  Respiratory: Clear  to auscultation bilaterally. GI: Soft, nontender, non-distended  MS: No  edema; No deformity. Neuro:  Nonfocal  Psych: Normal affect   Labs    Chemistry Recent Labs  Lab 12/04/17 1536 12/05/17 0234 12/07/17 0310  NA 140 142 143  K 4.6 3.7 4.0  CL  109 103 106  CO2 GLUCOSE 113* 102* 105*  BUN CREATININE 1.11* 1.12* 1.03*  CALCIUM 9.2 9.2 9.3  GFRNONAA 53* 53* 58*  GFRAA >60 >60 >60  ANIONGAP Hematology Recent Labs  Lab 12/06/17 0339 12/07/17 0310 12/08/17 0340  WBC 6.0 6.9 6.2  RBC 4.08 4.07 4.19  HGB 12.4 12.2 12.5  HCT 39.6 38.8 40.1  MCV 97.1 95.3 95.7  MCH 30.4 30.0 29.8  MCHC 31.3 31.4 31.2  RDW 16.3* 16.0* 16.0*  PLT 205 213 223    Cardiac EnzymesNo results for input(s): TROPONINI in the last 168 hours.  Recent Labs  Lab 12/04/17 1849  TROPIPOC 0.13*     BNP Recent Labs  Lab 12/04/17 1536  BNP 739.8*     DDimer No results for input(s): DDIMER in the last 168 hours.   Radiology    No results found.  Cardiac Studies   TEE  Findings:  1. LEFT VENTRICLE: The left ventricular wall thickness is moderately increased.  The left ventricular cavity is moderately dilated in size. Wall  motion is severely hypokinetic.  LVEF is 25-30%.  2. RIGHT VENTRICLE:  The right ventricle is mildly dilated with reduced systolic function.    3. LEFT ATRIUM:  The left atrium is severely dilated in size without any thrombus or masses.  There is spontaneous echo contrast ("smoke") in the left atrium consistent with a low flow state.  4. LEFT ATRIAL APPENDAGE:  The left atrial appendage is noted to have a thrombus at the apex of the appendage. The appendage has single lobes. Pulse doppler indicates very low flow in the appendage.  5. ATRIAL SEPTUM:  The atrial septum appears intact and is free of thrombus and/or masses.  There is no evidence for interatrial shunting by color doppler.  6. RIGHT ATRIUM:  The right atrium is dilated without any thrombus or masses.  7. MITRAL VALVE:  The mitral valve has been repaired with an annuloplasty ring. This was visualized in 2D and 3D modes and demonstrates mild to moderate stenosis (with poor excursion of the posterior leaflet) and Mild  regurgitation.  There were no vegetations.  8. AORTIC VALVE:  The aortic valve is trileaflet with leaflet thickening and Mild regurgitation.  There were no vegetations or stenosis.  9. TRICUSPID VALVE:  The tricuspid valve is normal in structure and function with Moderate regurgitation.  There were no vegetations or stenosis  10.  PULMONIC VALVE:  The pulmonic valve is normal in structure and function with trivial regurgitation.  There were no vegetations or stenosis.   11. AORTIC ARCH, ASCENDING AND DESCENDING AORTA:  Poorly visualized.  12. PULMONARY VEINS: Anomalous pulmonary venous return was not noted.  13. PERICARDIUM: The pericardium appeared normal and non-thickened.  There is no pericardial effusion.  IMPRESSION:   1. 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 2. Negative for PFO by color doppler 3. S/p Mitral annuloplasty repair with mild to moderate stenosis and regurgitation and poor posterior leaflet excursion. 4. Mild central AI 5. Moderate TR 6. Severe LAE with spontaneous echo contrast (Smoke), suggestive of a low-flow state 7. Mild RV dysfunction 8. Severe LV dysfunction, LVEF 25-30%      Patient Profile     57 y.o. 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 Hospital course complicated by acute on chronic CHF and atrial flutter.   Assessment & Plan    ATRIAL FLUTTER WITH RVR:  We are not able to do a cardioversion or ablation at this point secondary to clot.  Continue anticoagulation and I will attempt to use beta blocker for rate control.  Might benefit from ablation in the future. I will increase the beta blocker and add digoxin.    NSTEMI:  Probably demand ischemia.  No change in therapy.  ACUTE ON CHRONIC SYSTOLIC AND DIASTOLIC HF:   Titrating meds as above.  I started Entresto.    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   Will need, given this to do a cardiac cath and consider ICD this admission.   I will stop the Elqiuis and plan for cath tomorrow afternoon.    MV REPAIR:  Moderate stenosis and regurgitation as noted.  For questions or updates, please contact [facility] Please consult [email or web address] for contact info under Cardiology/STEMI.   Signed, Lakashia Collison, MD  12/08/2017, 11:23 AM    

## 2017-12-08 NOTE — Consult Note (Addendum)
ELECTROPHYSIOLOGY CONSULT NOTE    Patient ID: [name] MRN: [id number], DOB/AGE: 59-04-1961 59 y.o.  Admit date: 12/04/2017 Date of Consult: 12/09/2017  Primary Physician: No primary care provider on file. Primary Cardiologist: Katrinka Blazing Electrophysiologist: Shatira Dobosz  Patient Profile: [name] is a 57 y.o. female with a history of CAD s/p CABG, HTN, hyperlipidemia, chronic systolic heart failure, prior atrial fibrillation post CABG who is being seen today for the evaluation of atrial flutter and NSVT at the request of Dr Antoine Poche.  HPI:  [name] is a 57 y.o. female with the above past medical history. She follows closely with Dr Katrinka Blazing.  She has chronic stable angina mostly related to emotional stress.  On the day of admission, she presented to the hospital for worsening angina and shortness of breath.  She was found to be in atrial flutter with RVR. She ruled in as a NSTEMI. Catheterization this morning demonstrated new found ostial occlusion proximal to stent from 09/2015. TEE this admission demonstrated LAA thrombus.  Telemetry has demonstrated short runs of NSVT.  EP has been asked to evaluate for treatment options.   She continues to have stable angina with exertion - similar to what she has had in the past.  Shortness of breath is improved. She has palpitations with activity.  She has not had dizziness, pre-syncope, or syncope recently.  She has had 2 prior pre-syncopal spells, both occurred in the setting of getting her hair done and she was improved after "cooling off".     Past Medical History:  Diagnosis Date  . Atrial fibrillation (HCC)   . Atrial flutter (HCC)   . CHF (congestive heart failure) (HCC)   . Coronary atherosclerosis of native coronary artery   . HTN (hypertension)   . Hx of CABG    LIMA-LAD, SVG-DIAG, SVG-OM, SVG-RCA  . Hypercholesteremia   . Myocardial infarct (HCC) 2007  . NSTEMI (non-ST elevated myocardial infarction) (HCC) 10/13/2015  . Obesity        Surgical History:  Past Surgical History:  Procedure Laterality Date  . ABDOMINAL HYSTERECTOMY    . CARDIAC CATHETERIZATION N/A 10/14/2015   Procedure: Left Heart Cath and Cors/Grafts Angiography;  Surgeon: Marykay Lex, MD;  Location: [facility] INVASIVE CV LAB;  Service: Cardiovascular;  Laterality: N/A;  . CARDIAC CATHETERIZATION N/A 10/14/2015   Procedure: Coronary Stent Intervention;  Surgeon: Marykay Lex, MD;  Location: [facility] INVASIVE CV LAB;  Service: Cardiovascular;  Laterality: N/A;  . CESAREAN SECTION  1985  . CORONARY ANGIOPLASTY WITH STENT PLACEMENT  2003   LAD Promus DES [name] 10/13/2015  . CORONARY ARTERY BYPASS GRAFT  03/2006   "CABG X3"  . LEFT HEART CATH AND CORS/GRAFTS ANGIOGRAPHY N/A 12/09/2017   Procedure: LEFT HEART CATH AND CORS/GRAFTS ANGIOGRAPHY;  Surgeon: Marykay Lex, MD;  Location: [facility] INVASIVE CV LAB;  Service: Cardiovascular;  Laterality: N/A;  . MITRAL VALVE REPAIR     [name] 10/13/2015  . TEE WITHOUT CARDIOVERSION N/A 12/07/2017   Procedure: TRANSESOPHAGEAL ECHOCARDIOGRAM (TEE);  Surgeon: Chrystie Nose, MD;  Location: [facility] ENDOSCOPY;  Service: Cardiovascular;  Laterality: N/A;     Medications Prior to Admission  Medication Sig Dispense Refill Last Dose  . acetaminophen (TYLENOL) 500 MG tablet Take 1,000 mg by mouth every 6 (six) hours as needed for headache (pain).   12/02/2017  . aspirin EC 81 MG tablet Take 1 tablet (81 mg total) by mouth daily. 90 tablet 3 12/02/2017  . atorvastatin (LIPITOR) 80 MG tablet TAKE 1  TABLET (80 MG TOTAL) BY MOUTH DAILY AT 6 PM. (Patient taking differently: Take 80 mg by mouth daily. ) 30 tablet 8 12/02/2017 at am  . carvedilol (COREG) 12.5 MG tablet TAKE 1 TABLET (12.5 MG TOTAL) BY MOUTH 2 (TWO) TIMES DAILY WITH A MEAL. 60 tablet 12 12/04/2017 at 1300  . clopidogrel (PLAVIX) 75 MG tablet Take 1 tablet (75 mg total) by mouth daily. 90 tablet 3 12/02/2017 at am  . furosemide (LASIX) 40 MG tablet Take 1 tablet (40 mg total) by mouth  daily. 90 tablet 2 12/02/2017 at am  . lisinopril (PRINIVIL,ZESTRIL) 40 MG tablet Take 1 tablet (40 mg total) by mouth daily. 90 tablet 3 12/04/2017 at 1300  . Multiple Vitamin (MULTIVITAMIN WITH MINERALS) TABS tablet Take 1 tablet by mouth daily.   12/02/2017  . triamcinolone cream (KENALOG) 0.1 % Apply 1 application topically at bedtime.   12/03/2017 at pm  . nitroGLYCERIN (NITROSTAT) 0.4 MG SL tablet Place 0.4 mg under the tongue every 5 (five) minutes as needed for chest pain (up to 3 doses before calling EMS).    6 months ago    Inpatient Medications:  . apixaban  5 mg Oral BID  . aspirin EC  81 mg Oral Daily  . atorvastatin  80 mg Oral Daily  . digoxin  0.25 mg Oral Daily  . metoprolol tartrate  25 mg Oral QID  . multivitamin with minerals  1 tablet Oral Daily  . sacubitril-valsartan  1 tablet Oral BID  . triamcinolone cream  1 application Topical QHS    Allergies:  Allergies  Allergen Reactions  . Other Itching    GEL ON ELECTRODES FOR EKG    Social History   Socioeconomic History  . Marital status: Married    Spouse name: Not on file  . Number of children: Not on file  . Years of education: Not on file  . Highest education level: Not on file  Occupational History  . Not on file  Social Needs  . Financial resource strain: Not on file  . Food insecurity:    Worry: Not on file    Inability: Not on file  . Transportation needs:    Medical: Not on file    Non-medical: Not on file  Tobacco Use  . Smoking status: Former Smoker    Packs/day: 1.00    Years: 30.00    Pack years: 30.00    Types: Cigarettes  . Smokeless tobacco: Never Used  . Tobacco comment: "quit smoking cigarettes in ~ 2007"  Substance and Sexual Activity  . Alcohol use: Yes    Alcohol/week: 0.6 oz    Types: 1 Cans of beer per week    Comment: occ  . Drug use: No  . Sexual activity: Not Currently  Lifestyle  . Physical activity:    Days per week: Not on file    Minutes per session: Not on file    . Stress: Not on file  Relationships  . Social connections:    Talks on phone: Not on file    Gets together: Not on file    Attends religious service: Not on file    Active member of club or organization: Not on file    Attends meetings of clubs or organizations: Not on file    Relationship status: Not on file  . Intimate partner violence:    Fear of current or ex partner: Not on file    Emotionally abused: Not on file  Physically abused: Not on file    Forced sexual activity: Not on file  Other Topics Concern  . Not on file  Social History Narrative  . Not on file     Family History  Problem Relation Age of Onset  . Hypertension Father   . Stroke Sister   . Hypertension Sister   . Hypertension Brother   . Heart attack Neg Hx      Review of Systems: All other systems reviewed and are otherwise negative except as noted above.  Physical Exam: Vitals:   12/09/17 0944 12/09/17 0949 12/09/17 0954 12/09/17 1012  BP: 123/80 125/78 119/69 96/84  Pulse: 93 (!) 108 (!) 106 68  Resp: Temp:    97.7 F (36.5 C)  TempSrc:    Oral  SpO2: 99% 100% 97% 93%  Weight:      Height:        GEN- The patient is well appearing, alert and oriented x 3 today.   HEENT: normocephalic, atraumatic; sclera clear, conjunctiva pink; hearing intact; oropharynx clear; neck supple Lungs- Clear to ausculation bilaterally, normal work of breathing.  No wheezes, rales, rhonchi Heart- Irregular rate and rhythm  GI- soft, non-tender, non-distended, bowel sounds present Extremities- no clubbing, cyanosis, or edema  MS- no significant deformity or atrophy Skin- warm and dry, no rash or lesion Psych- euthymic mood, full affect Neuro- strength and sensation are intact  Labs:   Lab Results  Component Value Date   WBC 6.3 12/09/2017   HGB 13.5 12/09/2017   HCT 42.5 12/09/2017   MCV 94.2 12/09/2017   PLT 236 12/09/2017    Recent Labs  Lab 12/07/17 0310  NA 143  K 4.0  CL 106   CO2 26  BUN 12  CREATININE 1.03*  CALCIUM 9.3  GLUCOSE 105*      Radiology/Studies: Dg Chest 2 View  Result Date: 12/04/2017 CLINICAL DATA:  Chest pain and shortness of breath EXAM: CHEST - 2 VIEW COMPARISON:  Chest radiograph 10/13/2015. FINDINGS: Monitoring leads overlie the patient. Marked enlargement cardiopericardial silhouette. Patient status post median sternotomy and CABG procedure. Mild retrocardiac consolidation. No pleural effusion or pneumothorax. Thoracic spine degenerative changes. IMPRESSION: Marked enlargement of the cardiopericardial silhouette which may represent cardiomegaly. Pericardial effusion not excluded. Heterogeneous opacities left lung base may represent atelectasis. Electronically Signed   By: Annia Belt M.D.   On: 12/04/2017 16:13   Ct Angio Chest Pe W And/or Wo Contrast  Result Date: 12/04/2017 CLINICAL DATA:  57 year old female with acute shortness of breath and chest pain. EXAM: CT ANGIOGRAPHY CHEST WITH CONTRAST TECHNIQUE: Multidetector CT imaging of the chest was performed using the standard protocol during bolus administration of intravenous contrast. Multiplanar CT image reconstructions and MIPs were obtained to evaluate the vascular anatomy. CONTRAST:  ISOVUE-370 IOPAMIDOL (ISOVUE-370) INJECTION 76% COMPARISON:  12/04/2017 and prior chest radiographs. 03/25/2006 chest CT FINDINGS: Cardiovascular: This is a technically satisfactory study. No pulmonary emboli are identified. Cardiomegaly, CABG and mitral valve replacement changes noted. Thoracic aortic atherosclerotic calcifications identified without aneurysm. No pericardial effusion. Mediastinum/Nodes: No enlarged mediastinal, hilar, or axillary lymph nodes. Thyroid gland, trachea, and esophagus demonstrate no significant findings. Lungs/Pleura: Mild interlobular septal thickening is identified, RIGHT greater than LEFT and may represent mild interstitial edema. A small RIGHT pleural effusion is present.  Mild bibasilar atelectasis identified. Subcentimeter nodules within the LEFT UPPER lobe and RIGHT LOWER lobe are unchanged-benign. No mass or pneumothorax. Upper Abdomen: No acute abnormality Musculoskeletal: No  acute or suspicious bony abnormalities. Review of the MIP images confirms the above findings. IMPRESSION: 1. No evidence of pulmonary emboli 2. Mild interlobular septal thickening, RIGHT greater than LEFT, which may represent mild interstitial edema. 3. Small RIGHT pleural effusion 4. Cardiomegaly, CABG and mitral valve replacement changes. 5.  Aortic Atherosclerosis (ICD10-I70.0). Electronically Signed   By: Harmon Pier M.D.   On: 12/04/2017 18:49    WUJ:WJXBJYN atrial flutter, rate 67 (personally reviewed)  TELEMETRY: atrial flutter, run of NSVT 12/08/17, none since  (personally reviewed)  Assessment/Plan: 1.  Typical atrial flutter TEE demonstrated LAA thrombus this admission Continue Eliquis for 3 weeks uninterrupted and then consider ablation Continue rate control for now  2.  NSVT  In the setting of CAD and cardiomyopathy Cardiomyopathy has been longstanding EF a little worse this admission She has not had dizziness, syncope, or pre-syncope Continue medical therapy  3.  CAD s/p CABG Cath as above Continue medical therapy  4.  S/p MV repair Mild to moderate MS by TEE this admission   Follow up with Dr Johney Frame has been arranged.   Signed, Gypsy Balsam, NP 12/09/2017 12:07 PM  I have seen, examined the patient, and reviewed the above assessment and plan.  Changes to above are made where necessary.  On exam, iRRR.  Pt with typical appearing atrial flutter with LAA thrombus.  She is s/p prior CABG and mitral valve repair.  She did not have MAZE or LAA amputation (per my review of Dr Zenaida Niece Trigt's op note).  She also has reduced EF with NSVT, though no symptoms with her NSVT. I would advise aggressive beta blocker titration as able.  Continue anticoagulation x 4 weeks and then  return for TEE guided atrial flutter ablation.  Reassess EF once in sinus.  No further inpatient EP workup planned  Electrophysiology team to see as needed while here. Please call with questions.  Co Sign: Hillis Range, MD 12/09/2017 5:25 PM

## 2017-12-08 NOTE — Care Management Note (Signed)
Case Management Note Donn Pierini RN, BSN Unit 4E-Case Manager [phone]  Patient Details  Name: [name] MRN: [id number] Date of Birth: 1960/10/24  Subjective/Objective:   Pt admitted with NSTEMI, afib with LLA thrombus                Action/Plan: PTA pt lived at home, independent, plan for pt to d/c on Eliquis- insurance check completed with copay $85/mo- spoke with pt at bedside coverage info shared- per pt [facility] uses [facility]- pt provided both 30 day free card and copay assist card to use- pt voices understanding to use 30 day free card first on discharge and copay assist card if she remains on drug. - no further CM needs noted at this time- will continue to follow.   Expected Discharge Date:                  Expected Discharge Plan:  Home/Self Care  In-House Referral:  NA  Discharge planning Services  CM Consult, Medication Assistance  Post Acute Care Choice:    Choice offered to:     DME Arranged:    DME Agency:     HH Arranged:    HH Agency:     Status of Service:  In process, will continue to follow  If discussed at Long Length of Stay Meetings, dates discussed:    Discharge Disposition: home/self care   Additional Comments:  Darrold Span, RN 12/08/2017, 4:08 PM

## 2017-12-08 NOTE — Progress Notes (Signed)
Per insurance check on Eliquis  # 4.  S/W MARK @ [employer] [facility] # [phone]    ELIQUIS 5 MG BID  COVER- YES  CO-PAY- $ 85.00  TIER- 3 DRUG  PRIOR APPROVAL- NO   DEDUCTIBLE: NOT MET   PREFERRED PHARMACY : [facility]  

## 2017-12-08 NOTE — Progress Notes (Signed)
Progress Note  Patient Name: [name] Date of Encounter: 12/08/2017  Primary Cardiologist:   Lesleigh Noe, MD   Subjective   She has some weakness but no pain.  No SOB.  She feels the heart racing.    Inpatient Medications    Scheduled Meds: . apixaban  5 mg Oral BID  . aspirin EC  81 mg Oral Daily  . atorvastatin  80 mg Oral Daily  . metoprolol tartrate  25 mg Oral TID  . multivitamin with minerals  1 tablet Oral Daily  . sacubitril-valsartan  1 tablet Oral BID  . sodium chloride flush  3 mL Intravenous Q12H  . triamcinolone cream  1 application Topical QHS   Continuous Infusions: . sodium chloride     PRN Meds: acetaminophen, nitroGLYCERIN, ondansetron (ZOFRAN) IV, sodium chloride flush   Vital Signs    Vitals:   12/07/17 2017 12/08/17 0037 12/08/17 0448 12/08/17 0908  BP: 111/86 (!) 131/94 (!) 126/92 121/89  Pulse: (!) 126 (!) 125 (!) 130 (!) 132  Resp: (!) 33 (!) 26  (!) 36  Temp: 97.9 F (36.6 C) 98.3 F (36.8 C) 98.1 F (36.7 C) (!) 96.7 F (35.9 C)  TempSrc: Oral Oral Oral Axillary  SpO2: 100% (!) 88% 98% 96%  Weight:   161 lb 3.2 oz (73.1 kg)   Height:        Intake/Output Summary (Last 24 hours) at 12/08/2017 1123 Last data filed at 12/07/2017 2048 Gross per 24 hour  Intake 600 ml  Output -  Net 600 ml   Filed Weights   12/07/17 0419 12/07/17 1053 12/08/17 0448  Weight: 161 lb 8 oz (73.3 kg) 161 lb 8 oz (73.3 kg) 161 lb 3.2 oz (73.1 kg)    Telemetry    Atrial flutter with rapid rate - Personally Reviewed  ECG    NA - Personally Reviewed  Physical Exam   GEN: No acute distress.   Neck: No  JVD Cardiac: Irregular RR, no murmurs, rubs, or gallops.  Respiratory: Clear  to auscultation bilaterally. GI: Soft, nontender, non-distended  MS: No  edema; No deformity. Neuro:  Nonfocal  Psych: Normal affect   Labs    Chemistry Recent Labs  Lab 12/04/17 1536 12/05/17 0234 12/07/17 0310  NA 140 142 143  K 4.6 3.7 4.0  CL  109 103 106  CO2 GLUCOSE 113* 102* 105*  BUN CREATININE 1.11* 1.12* 1.03*  CALCIUM 9.2 9.2 9.3  GFRNONAA 53* 53* 58*  GFRAA >60 >60 >60  ANIONGAP Hematology Recent Labs  Lab 12/06/17 0339 12/07/17 0310 12/08/17 0340  WBC 6.0 6.9 6.2  RBC 4.08 4.07 4.19  HGB 12.4 12.2 12.5  HCT 39.6 38.8 40.1  MCV 97.1 95.3 95.7  MCH 30.4 30.0 29.8  MCHC 31.3 31.4 31.2  RDW 16.3* 16.0* 16.0*  PLT 205 213 223    Cardiac EnzymesNo results for input(s): TROPONINI in the last 168 hours.  Recent Labs  Lab 12/04/17 1849  TROPIPOC 0.13*     BNP Recent Labs  Lab 12/04/17 1536  BNP 739.8*     DDimer No results for input(s): DDIMER in the last 168 hours.   Radiology    No results found.  Cardiac Studies   TEE  Findings:  1. LEFT VENTRICLE: The left ventricular wall thickness is moderately increased.  The left ventricular cavity is moderately dilated in size. Wall  motion is severely hypokinetic.  LVEF is 25-30%.  2. RIGHT VENTRICLE:  The right ventricle is mildly dilated with reduced systolic function.    3. LEFT ATRIUM:  The left atrium is severely dilated in size without any thrombus or masses.  There is spontaneous echo contrast ("smoke") in the left atrium consistent with a low flow state.  4. LEFT ATRIAL APPENDAGE:  The left atrial appendage is noted to have a thrombus at the apex of the appendage. The appendage has single lobes. Pulse doppler indicates very low flow in the appendage.  5. ATRIAL SEPTUM:  The atrial septum appears intact and is free of thrombus and/or masses.  There is no evidence for interatrial shunting by color doppler.  6. RIGHT ATRIUM:  The right atrium is dilated without any thrombus or masses.  7. MITRAL VALVE:  The mitral valve has been repaired with an annuloplasty ring. This was visualized in 2D and 3D modes and demonstrates mild to moderate stenosis (with poor excursion of the posterior leaflet) and Mild  regurgitation.  There were no vegetations.  8. AORTIC VALVE:  The aortic valve is trileaflet with leaflet thickening and Mild regurgitation.  There were no vegetations or stenosis.  9. TRICUSPID VALVE:  The tricuspid valve is normal in structure and function with Moderate regurgitation.  There were no vegetations or stenosis  10.  PULMONIC VALVE:  The pulmonic valve is normal in structure and function with trivial regurgitation.  There were no vegetations or stenosis.   11. AORTIC ARCH, ASCENDING AND DESCENDING AORTA:  Poorly visualized.  12. PULMONARY VEINS: Anomalous pulmonary venous return was not noted.  13. PERICARDIUM: The pericardium appeared normal and non-thickened.  There is no pericardial effusion.  IMPRESSION:   1. 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 2. Negative for PFO by color doppler 3. S/p Mitral annuloplasty repair with mild to moderate stenosis and regurgitation and poor posterior leaflet excursion. 4. Mild central AI 5. Moderate TR 6. Severe LAE with spontaneous echo contrast (Smoke), suggestive of a low-flow state 7. Mild RV dysfunction 8. Severe LV dysfunction, LVEF 25-30%      Patient Profile     57 y.o. 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 Hospital course complicated by acute on chronic CHF and atrial flutter.   Assessment & Plan    ATRIAL FLUTTER WITH RVR:  We are not able to do a cardioversion or ablation at this point secondary to clot.  Continue anticoagulation and I will attempt to use beta blocker for rate control.  Might benefit from ablation in the future. I will increase the beta blocker and add digoxin.    NSTEMI:  Probably demand ischemia.  No change in therapy.  ACUTE ON CHRONIC SYSTOLIC AND DIASTOLIC HF:   Titrating meds as above.  I started Entresto.    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   Will need, given this to do a cardiac cath and consider ICD this admission.   I will stop the Elqiuis and plan for cath tomorrow afternoon.    MV REPAIR:  Moderate stenosis and regurgitation as noted.  For questions or updates, please contact [facility] Please consult [email or web address] for contact info under Cardiology/STEMI.   Signed, Rollene Rotunda, MD  12/08/2017, 11:23 AM

## 2017-12-09 ENCOUNTER — Encounter (HOSPITAL_COMMUNITY)
Admission: EM | Disposition: A | Discharge status: Home / Self Care | Payer: Self-pay | Source: Home / Self Care | Attending: Cardiology

## 2017-12-09 ENCOUNTER — Encounter (HOSPITAL_COMMUNITY): Payer: Self-pay | Admitting: Cardiology

## 2017-12-09 DIAGNOSIS — I519 Heart disease, unspecified: Secondary | ICD-10-CM

## 2017-12-09 DIAGNOSIS — I4891 Unspecified atrial fibrillation: Secondary | ICD-10-CM

## 2017-12-09 DIAGNOSIS — I472 Ventricular tachycardia: Secondary | ICD-10-CM

## 2017-12-09 HISTORY — PX: LEFT HEART CATH AND CORS/GRAFTS ANGIOGRAPHY: CATH118250

## 2017-12-09 LAB — CBC
HEMATOCRIT: 42.5 % (ref 36.0–46.0)
Hemoglobin: 13.5 g/dL (ref 12.0–15.0)
MCH: 29.9 pg (ref 26.0–34.0)
MCHC: 31.8 g/dL (ref 30.0–36.0)
MCV: 94.2 fL (ref 78.0–100.0)
PLATELETS: 236 10*3/uL (ref 150–400)
RBC: 4.51 MIL/uL (ref 3.87–5.11)
RDW: 15.8 % — AB (ref 11.5–15.5)
WBC: 6.3 10*3/uL (ref 4.0–10.5)

## 2017-12-09 LAB — PROTIME-INR
INR: 1.49
Prothrombin Time: 17.9 seconds — ABNORMAL HIGH (ref 11.4–15.2)

## 2017-12-09 LAB — HEPARIN LEVEL (UNFRACTIONATED)

## 2017-12-09 LAB — APTT: aPTT: 114 seconds — ABNORMAL HIGH (ref 24–36)

## 2017-12-09 SURGERY — LEFT HEART CATH AND CORS/GRAFTS ANGIOGRAPHY
Anesthesia: LOCAL

## 2017-12-09 MED ORDER — METOPROLOL SUCCINATE ER 50 MG PO TB24
50.0000 mg | ORAL_TABLET | Freq: Two times a day (BID) | ORAL | Status: DC
  Administered 2017-12-10: 50 mg via ORAL
  Filled 2017-12-09: qty 1

## 2017-12-09 MED ORDER — IOHEXOL 350 MG/ML SOLN
INTRAVENOUS | Status: DC | PRN
  Administered 2017-12-09: 70 mL via INTRA_ARTERIAL

## 2017-12-09 MED ORDER — VERAPAMIL HCL 2.5 MG/ML IV SOLN
INTRAVENOUS | Status: AC
  Filled 2017-12-09: qty 2

## 2017-12-09 MED ORDER — ASPIRIN 81 MG PO CHEW
81.0000 mg | CHEWABLE_TABLET | ORAL | Status: AC
  Administered 2017-12-09: 81 mg via ORAL
  Filled 2017-12-09: qty 1

## 2017-12-09 MED ORDER — HEPARIN SODIUM (PORCINE) 1000 UNIT/ML IJ SOLN
INTRAMUSCULAR | Status: DC | PRN
  Administered 2017-12-09: 4000 [IU] via INTRAVENOUS

## 2017-12-09 MED ORDER — SODIUM CHLORIDE 0.9 % WEIGHT BASED INFUSION
1.0000 mL/kg/h | INTRAVENOUS | Status: AC
  Administered 2017-12-09: 1 mL/kg/h via INTRAVENOUS

## 2017-12-09 MED ORDER — SODIUM CHLORIDE 0.9 % IV SOLN
INTRAVENOUS | Status: AC | PRN
  Administered 2017-12-09: 73 mL/kg/h via INTRAVENOUS

## 2017-12-09 MED ORDER — HEPARIN (PORCINE) IN NACL 2-0.9 UNITS/ML
INTRAMUSCULAR | Status: AC | PRN
  Administered 2017-12-09: 500 mL

## 2017-12-09 MED ORDER — APIXABAN 5 MG PO TABS
5.0000 mg | ORAL_TABLET | Freq: Two times a day (BID) | ORAL | Status: DC
  Administered 2017-12-09 – 2017-12-13 (×8): 5 mg via ORAL
  Filled 2017-12-09 (×8): qty 1

## 2017-12-09 MED ORDER — SODIUM CHLORIDE 0.9% FLUSH: 3.0000 mL | Freq: Two times a day (BID) | INTRAVENOUS | Status: DC

## 2017-12-09 MED ORDER — SODIUM CHLORIDE 0.9 % IV SOLN: 250.0000 mL | INTRAVENOUS | Status: DC | PRN

## 2017-12-09 MED ORDER — SODIUM CHLORIDE 0.9 % IV SOLN
INTRAVENOUS | Status: DC
  Administered 2017-12-09: 05:00:00 via INTRAVENOUS

## 2017-12-09 MED ORDER — SODIUM CHLORIDE 0.9% FLUSH: 3.0000 mL | INTRAVENOUS | Status: DC | PRN

## 2017-12-09 MED ORDER — MIDAZOLAM HCL 2 MG/2ML IJ SOLN
INTRAMUSCULAR | Status: DC | PRN
  Administered 2017-12-09: 1 mg via INTRAVENOUS

## 2017-12-09 MED ORDER — HEPARIN (PORCINE) IN NACL 1000-0.9 UT/500ML-% IV SOLN
INTRAVENOUS | Status: AC
  Filled 2017-12-09: qty 1000

## 2017-12-09 MED ORDER — LIDOCAINE HCL (PF) 1 % IJ SOLN
INTRAMUSCULAR | Status: DC | PRN
  Administered 2017-12-09: 2 mL

## 2017-12-09 MED ORDER — HEPARIN SODIUM (PORCINE) 1000 UNIT/ML IJ SOLN
INTRAMUSCULAR | Status: AC
  Filled 2017-12-09: qty 1

## 2017-12-09 MED ORDER — MIDAZOLAM HCL 2 MG/2ML IJ SOLN
INTRAMUSCULAR | Status: AC
  Filled 2017-12-09: qty 2

## 2017-12-09 MED ORDER — FENTANYL CITRATE (PF) 100 MCG/2ML IJ SOLN
INTRAMUSCULAR | Status: DC | PRN
  Administered 2017-12-09 (×2): 25 ug via INTRAVENOUS

## 2017-12-09 MED ORDER — VERAPAMIL HCL 2.5 MG/ML IV SOLN
INTRAVENOUS | Status: DC | PRN
  Administered 2017-12-09: 10 mL via INTRA_ARTERIAL

## 2017-12-09 MED ORDER — LIDOCAINE HCL (PF) 1 % IJ SOLN
INTRAMUSCULAR | Status: AC
  Filled 2017-12-09: qty 30

## 2017-12-09 MED ORDER — FENTANYL CITRATE (PF) 100 MCG/2ML IJ SOLN
INTRAMUSCULAR | Status: AC
  Filled 2017-12-09: qty 2

## 2017-12-09 MED ORDER — SODIUM CHLORIDE 0.9% FLUSH
3.0000 mL | Freq: Two times a day (BID) | INTRAVENOUS | Status: DC
  Administered 2017-12-09: 3 mL via INTRAVENOUS

## 2017-12-09 SURGICAL SUPPLY — 16 items
CATH INFINITI 5FR AL1 (CATHETERS) ×1 IMPLANT
CATH INFINITI JR4 5F (CATHETERS) ×1 IMPLANT
CATH OPTITORQUE TIG 4.0 5F (CATHETERS) ×1 IMPLANT
COVER PRB 48X5XTLSCP FOLD TPE (BAG) IMPLANT
COVER PROBE 5X48 (BAG) ×2
DEVICE RAD TR BAND REGULAR (VASCULAR PRODUCTS) ×1 IMPLANT
GUIDEWIRE INQWIRE 1.5J.035X260 (WIRE) IMPLANT
INQWIRE 1.5J .035X260CM (WIRE) ×2
KIT HEART LEFT (KITS) ×2 IMPLANT
NDL PERC 21GX4CM (NEEDLE) IMPLANT
NEEDLE PERC 21GX4CM (NEEDLE) ×2 IMPLANT
PACK CARDIAC CATHETERIZATION (CUSTOM PROCEDURE TRAY) ×2 IMPLANT
SHEATH RAIN RADIAL 21G 6FR (SHEATH) ×1 IMPLANT
SYR MEDRAD MARK V 150ML (SYRINGE) ×2 IMPLANT
TRANSDUCER W/STOPCOCK (MISCELLANEOUS) ×2 IMPLANT
TUBING CIL FLEX 10 FLL-RA (TUBING) ×2 IMPLANT

## 2017-12-09 NOTE — Progress Notes (Signed)
Cath lab called this nurse to inform of patient surgical time moved up. Patient given AM medications with a sip of water. Patient had CHG bath this AM at 2:30. Patient voided at 0815. Telemetry removed at [phone] and CCMD notified. Heparin drip discontinued at 0833 prior to patient transport. Patient will be transported via Cath lab transporter. Patient called family to notify of schedule change. Patient assisted to bed for transport.

## 2017-12-09 NOTE — Progress Notes (Signed)
Progress Note  Patient Name: [name] Date of Encounter: 12/09/2017  Primary Cardiologist:   Lesleigh Noe, MD   Subjective   No chest pain.  No SOB.   Inpatient Medications    Scheduled Meds: . aspirin EC  81 mg Oral Daily  . atorvastatin  80 mg Oral Daily  . digoxin  0.25 mg Oral Daily  . metoprolol tartrate  25 mg Oral QID  . multivitamin with minerals  1 tablet Oral Daily  . sacubitril-valsartan  1 tablet Oral BID  . sodium chloride flush  3 mL Intravenous Q12H  . triamcinolone cream  1 application Topical QHS   Continuous Infusions: . sodium chloride    . heparin Stopped (12/09/17 1610)   PRN Meds: acetaminophen, nitroGLYCERIN, ondansetron (ZOFRAN) IV, sodium chloride flush   Vital Signs    Vitals:   12/09/17 0944 12/09/17 0949 12/09/17 0954 12/09/17 1012  BP: 123/80 125/78 119/69 96/84  Pulse: 93 (!) 108 (!) 106 68  Resp: Temp:    97.7 F (36.5 C)  TempSrc:    Oral  SpO2: 99% 100% 97% 93%  Weight:      Height:        Intake/Output Summary (Last 24 hours) at 12/09/2017 1022 Last data filed at 12/08/2017 1800 Gross per 24 hour  Intake 409.83 ml  Output -  Net 409.83 ml   Filed Weights   12/07/17 1053 12/08/17 0448 12/09/17 0449  Weight: 161 lb 8 oz (73.3 kg) 161 lb 3.2 oz (73.1 kg) 160 lb 12.8 oz (72.9 kg)    Telemetry    Atrial flutter with rapid rate that is controlled this AM.  - Personally Reviewed  ECG    NA - Personally Reviewed  Physical Exam   GEN: No  acute distress.   Neck: No  JVD Cardiac: Irregular RR, no murmurs, rubs, or gallops.  Respiratory: Clear   to auscultation bilaterally. GI: Soft, nontender, non-distended, normal bowel sounds  MS:  No edema; No deformity.  Left radial with mild swelling.  Radial band in place Neuro:   Nonfocal  Psych: Oriented and appropriate    Labs    Chemistry Recent Labs  Lab 12/04/17 1536 12/05/17 0234 12/07/17 0310  NA 140 142 143  K 4.6 3.7 4.0  CL 109 103  106  CO2 GLUCOSE 113* 102* 105*  BUN CREATININE 1.11* 1.12* 1.03*  CALCIUM 9.2 9.2 9.3  GFRNONAA 53* 53* 58*  GFRAA >60 >60 >60  ANIONGAP Hematology Recent Labs  Lab 12/07/17 0310 12/08/17 0340 12/09/17 0428  WBC 6.9 6.2 6.3  RBC 4.07 4.19 4.51  HGB 12.2 12.5 13.5  HCT 38.8 40.1 42.5  MCV 95.3 95.7 94.2  MCH 30.0 29.8 29.9  MCHC 31.4 31.2 31.8  RDW 16.0* 16.0* 15.8*  PLT 213 223 236    Cardiac EnzymesNo results for input(s): TROPONINI in the last 168 hours.  Recent Labs  Lab 12/04/17 1849  TROPIPOC 0.13*     BNP Recent Labs  Lab 12/04/17 1536  BNP 739.8*     DDimer No results for input(s): DDIMER in the last 168 hours.   Radiology    No results found.  Cardiac Studies   TEE  Findings:  1. LEFT VENTRICLE: The left ventricular wall thickness is moderately increased.  The left ventricular cavity is moderately dilated in size. Wall motion is severely  hypokinetic.  LVEF is 25-30%.  2. RIGHT VENTRICLE:  The right ventricle is mildly dilated with reduced systolic function.    3. LEFT ATRIUM:  The left atrium is severely dilated in size without any thrombus or masses.  There is spontaneous echo contrast ("smoke") in the left atrium consistent with a low flow state.  4. LEFT ATRIAL APPENDAGE:  The left atrial appendage is noted to have a thrombus at the apex of the appendage. The appendage has single lobes. Pulse doppler indicates very low flow in the appendage.  5. ATRIAL SEPTUM:  The atrial septum appears intact and is free of thrombus and/or masses.  There is no evidence for interatrial shunting by color doppler.  6. RIGHT ATRIUM:  The right atrium is dilated without any thrombus or masses.  7. MITRAL VALVE:  The mitral valve has been repaired with an annuloplasty ring. This was visualized in 2D and 3D modes and demonstrates mild to moderate stenosis (with poor excursion of the posterior leaflet) and Mild  regurgitation.  There were no vegetations.  8. AORTIC VALVE:  The aortic valve is trileaflet with leaflet thickening and Mild regurgitation.  There were no vegetations or stenosis.  9. TRICUSPID VALVE:  The tricuspid valve is normal in structure and function with Moderate regurgitation.  There were no vegetations or stenosis  10.  PULMONIC VALVE:  The pulmonic valve is normal in structure and function with trivial regurgitation.  There were no vegetations or stenosis.   11. AORTIC ARCH, ASCENDING AND DESCENDING AORTA:  Poorly visualized.  12. PULMONARY VEINS: Anomalous pulmonary venous return was not noted.  13. PERICARDIUM: The pericardium appeared normal and non-thickened.  There is no pericardial effusion.  IMPRESSION:   1. 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 2. Negative for PFO by color doppler 3. S/p Mitral annuloplasty repair with mild to moderate stenosis and regurgitation and poor posterior leaflet excursion. 4. Mild central AI 5. Moderate TR 6. Severe LAE with spontaneous echo contrast (Smoke), suggestive of a low-flow state 7. Mild RV dysfunction 8. Severe LV dysfunction, LVEF 25-30% Procedures     LEFT HEART CATH AND CORS/GRAFTS ANGIOGRAPHY   12/09/2017  Conclusion     Prox Cx lesion is 70% stenosed. Ost 1st Mrg to 1st Mrg lesion is 65% stenosed.  Prox Cx to Mid Cx lesion is 100% stenosed - after 1st Mrg  SVG-2nd Mrg: Origin lesion is 100% stenosed. Known CTO.  Prox LAD lesion is 75% stenosed pre-2nd Diag. Prox LAD to Mid LAD lesion is 100% stenosed AFTER 2nd Diag & SP2 (SP1 & SP2 fill rPDA with faint collaterals)  LIMA-LAD graft was visualized by angiography. The graft exhibits no disease.  Ost 2nd Diag lesion is 100% stenosed.  SVG-Diag2 graft was visualized by angiography and is normal in caliber. The graft exhibits no disease --collateral branches fill 2nd Mrg into the distal Cx.  Prox RCA to Dist RCA lesion is 100% stenosed - known CTO.  NEW: Seq SVG-  RPAV-dRCA graft was visualized by angiography and is large. Prox Graft to Mid Graft lesion before Acute Mrg is 100% stenosed.  LV end diastolic pressure is mildly elevated.   Newly found ostial occlusion of likely sequential SVG-AM-dRCA (proximal to stent from March 2017).  This is in addition to known CTO of SVG-OM 2 Severe native disease with occluded mid RCA, mid circumflex and mid LAD as well as Diag2 Now 2 out of 4-5 grafts remain patent -SVG-Diag2 is patent with collaterals filling OM 2 and distal Circumflex,  LIMA-LAD is patent.  Suspect mild troponin elevation was related to new onset A. fib RVR with ischemic cardiomyopathy and severe existing CAD.     Patient Profile     57 y.o. 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 Hospital course complicated by acute on chronic CHF and atrial flutter.   Assessment & Plan    ATRIAL FLUTTER WITH RVR:  We are not able to do a cardioversion or ablation at this point secondary to clot.  Resume Eliquis after EP sees.  I will continue digoxin.  Possibly home on lower dose with follow up level.  Continue beta blocker as below.  Cardiovert/or ablate in a few weeks.    NSTEMI:    Disease as described above.  Medical management.    ACUTE ON CHRONIC SYSTOLIC AND DIASTOLIC HF:    She was started on Entresto this admission.  I am titrating the beta blocker as below.   NSVT:   No further VT overnight.  Continue to titrate beta blocker and I will change to succinate in the AM. .      MV REPAIR:  Moderate stenosis and regurgitation as noted.  No change in therapy.   For questions or updates, please contact [facility] Please consult [email or web address] for contact info under Cardiology/STEMI.   Signed, Rollene Rotunda, MD    12/09/2017, 10:22 AM

## 2017-12-09 NOTE — Interval H&P Note (Signed)
History and Physical Interval Note:  12/09/2017 9:00 AM  [name]  has presented today for surgery, with the diagnosis of Elevated Troponin in Setting of New Onset A. Fib. The various methods of treatment have been discussed with the patient and family. After consideration of risks, benefits and other options for treatment, the patient has consented to  Procedure(s): LEFT HEART CATH AND CORS/GRAFTS ANGIOGRAPHY (N/A) with possible PERCUTANEOUS CORONARY INTERVENTION as a surgical intervention .  The patient's history has been reviewed, patient examined, no change in status, stable for surgery.  I have reviewed the patient's chart and labs.  Questions were answered to the patient's satisfaction.    Cath Lab Visit (complete for each Cath Lab visit)  Clinical Evaluation Leading to the Procedure:   ACS: No.  Non-ACS:    Anginal Classification: CCS III  Anti-ischemic medical therapy: Maximal Therapy (2 or more classes of medications)  Non-Invasive Test Results: No non-invasive testing performed  Prior CABG: Previous CABG   Bryan Lemma

## 2017-12-09 NOTE — Progress Notes (Signed)
ANTICOAGULATION CONSULT NOTE    Pharmacy Consult for Eliquis >>> Bridging with Heparin Indication: atrial fibrillation / LAA Thrombus  Allergies  Allergen Reactions  . Other Itching    GEL ON ELECTRODES FOR EKG   Patient Measurements: Height:  (157.5 cm) Weight: 160 lb 12.8 oz (72.9 kg) IBW/kg (Calculated) : 50.1  Vital Signs: Temp: 97.5 F (36.4 C) (05/24 0820) Temp Source: Oral (05/24 0820) BP: 127/92 (05/24 0820) Pulse Rate: 103 (05/24 0820)  Labs: Recent Labs    12/07/17 0310 12/08/17 0340 12/09/17 0428  HGB 12.2 12.5 13.5  HCT 38.8 40.1 42.5  PLT 213 223 236  APTT  --   --  114*  LABPROT  --   --  17.9*  INR  --   --  1.49  HEPARINUNFRC  --   --  >2.20*  CREATININE 1.03*  --   --    Estimated Creatinine Clearance: 55 mL/min (A) (by C-G formula based on SCr of 1.03 mg/dL (H)).  Medical History: Past Medical History:  Diagnosis Date  . Atrial fibrillation (HCC)   . CHF (congestive heart failure) (HCC)   . Coronary atherosclerosis of native coronary artery   . HF (heart failure) (HCC)    with LVEF 40% 2008, Echo, functional class 2.  . HTN (hypertension)   . Hx of CABG    LIMA-LAD, SVG-DIAG, SVG-OM, SVG-RCA  . Hypercholesteremia   . Myocardial infarct (HCC) 2007  . NSTEMI (non-ST elevated myocardial infarction) (HCC) 10/13/2015  . Obesity     Medications:  Medications Prior to Admission  Medication Sig Dispense Refill Last Dose  . acetaminophen (TYLENOL) 500 MG tablet Take 1,000 mg by mouth every 6 (six) hours as needed for headache (pain).   12/02/2017  . aspirin EC 81 MG tablet Take 1 tablet (81 mg total) by mouth daily. 90 tablet 3 12/02/2017  . atorvastatin (LIPITOR) 80 MG tablet TAKE 1 TABLET (80 MG TOTAL) BY MOUTH DAILY AT 6 PM. (Patient taking differently: Take 80 mg by mouth daily. ) 30 tablet 8 12/02/2017 at am  . carvedilol (COREG) 12.5 MG tablet TAKE 1 TABLET (12.5 MG TOTAL) BY MOUTH 2 (TWO) TIMES DAILY WITH A MEAL. 60 tablet 12 12/04/2017 at  1300  . clopidogrel (PLAVIX) 75 MG tablet Take 1 tablet (75 mg total) by mouth daily. 90 tablet 3 12/02/2017 at am  . furosemide (LASIX) 40 MG tablet Take 1 tablet (40 mg total) by mouth daily. 90 tablet 2 12/02/2017 at am  . lisinopril (PRINIVIL,ZESTRIL) 40 MG tablet Take 1 tablet (40 mg total) by mouth daily. 90 tablet 3 12/04/2017 at 1300  . Multiple Vitamin (MULTIVITAMIN WITH MINERALS) TABS tablet Take 1 tablet by mouth daily.   12/02/2017  . triamcinolone cream (KENALOG) 0.1 % Apply 1 application topically at bedtime.   12/03/2017 at pm  . nitroGLYCERIN (NITROSTAT) 0.4 MG SL tablet Place 0.4 mg under the tongue every 5 (five) minutes as needed for chest pain (up to 3 doses before calling EMS).    6 months ago    Assessment: 57 year old female found to have LAA thrombus.  IV heparin resumed yesterday evening for cardiac cath. Patient has been transported to cath lab and the heparin infusion has been paused.  CBC stable, no issues reported.LAst dose of Eliquis 5/23 in the morning, heparin level and aPTT are not correlating at this time.   Initial aPTT supratherapeutic: 114 seconds,   Goal of Therapy:  HL 0.3-0.7 Monitor platelets by  anticoagulation protocol: Yes   Plan:  F/u anticoagulation plan post cath Monitor for s/sx of bleeding  Ruben Im, PharmD Clinical Pharmacist 12/09/2017 8:51 AM

## 2017-12-10 DIAGNOSIS — I429 Cardiomyopathy, unspecified: Secondary | ICD-10-CM

## 2017-12-10 DIAGNOSIS — I25119 Atherosclerotic heart disease of native coronary artery with unspecified angina pectoris: Secondary | ICD-10-CM

## 2017-12-10 MED ORDER — METOPROLOL SUCCINATE ER 50 MG PO TB24
75.0000 mg | ORAL_TABLET | Freq: Two times a day (BID) | ORAL | Status: DC
  Administered 2017-12-10 – 2017-12-11 (×2): 75 mg via ORAL
  Filled 2017-12-10 (×2): qty 1

## 2017-12-10 NOTE — Progress Notes (Signed)
[id number] Did not walk with pt as heart rate 132 lying in bed. Pt stated she has been walking to bathroom and that short activity tires her out. Gave pt MI and CHF booklets. Reviewed NTG use, MI restrictions, low sodium diets and discussed CHF and signs/symptoms of when to notify MD. Discussed CRP 2 and referred to [facility] but pt will not be able to attend until after ablation and rate controlled. Did not give ex ed due to elevated HR. Luetta Nutting RN BSN 12/10/2017 9:59 AM

## 2017-12-10 NOTE — Progress Notes (Signed)
Progress Note  Patient Name: [name] Date of Encounter: 12/10/2017  Primary Cardiologist: Dr. Verdis Prime  Subjective   Feels "tired" today with rapid heart rate.  No chest pain or shortness of breath at rest.  No dizziness.  Inpatient Medications    Scheduled Meds: . apixaban  5 mg Oral BID  . aspirin EC  81 mg Oral Daily  . atorvastatin  80 mg Oral Daily  . digoxin  0.25 mg Oral Daily  . metoprolol succinate  50 mg Oral BID  . multivitamin with minerals  1 tablet Oral Daily  . sacubitril-valsartan  1 tablet Oral BID  . triamcinolone cream  1 application Topical QHS   Continuous Infusions: . sodium chloride     PRN Meds: sodium chloride, acetaminophen, nitroGLYCERIN, ondansetron (ZOFRAN) IV, sodium chloride flush   Vital Signs    Vitals:   12/10/17 0429 12/10/17 0900 12/10/17 1115 12/10/17 1205  BP: (!) 121/96 (!) 137/98  129/87  Pulse: 69  (!) 132 (!) 132  Resp: 19     Temp: (!) 97.4 F (36.3 C) 98 F (36.7 C)    TempSrc: Oral     SpO2: 96% 97%  100%  Weight: 162 lb 4.8 oz (73.6 kg)     Height:        Intake/Output Summary (Last 24 hours) at 12/10/2017 1331 Last data filed at 12/10/2017 0900 Gross per 24 hour  Intake 480 ml  Output -  Net 480 ml   Filed Weights   12/08/17 0448 12/09/17 0449 12/10/17 0429  Weight: 161 lb 3.2 oz (73.1 kg) 160 lb 12.8 oz (72.9 kg) 162 lb 4.8 oz (73.6 kg)    Telemetry    Atrial flutter with RVR.  Personally reviewed.  Physical Exam   GEN: No acute distress.   Neck: No JVD. Cardiac:  Rapid RRR, no gallop.  Respiratory: Nonlabored. Clear to auscultation bilaterally. GI: Soft, nontender, bowel sounds present. MS: No edema; No deformity. Neuro:  Nonfocal. Psych: Alert and oriented x 3. Normal affect.  Labs    Chemistry Recent Labs  Lab 12/04/17 1536 12/05/17 0234 12/07/17 0310  NA 140 142 143  K 4.6 3.7 4.0  CL 109 103 106  CO2 GLUCOSE 113* 102* 105*  BUN CREATININE 1.11* 1.12*  1.03*  CALCIUM 9.2 9.2 9.3  GFRNONAA 53* 53* 58*  GFRAA >60 >60 >60  ANIONGAP Hematology Recent Labs  Lab 12/07/17 0310 12/08/17 0340 12/09/17 0428  WBC 6.9 6.2 6.3  RBC 4.07 4.19 4.51  HGB 12.2 12.5 13.5  HCT 38.8 40.1 42.5  MCV 95.3 95.7 94.2  MCH 30.0 29.8 29.9  MCHC 31.4 31.2 31.8  RDW 16.0* 16.0* 15.8*  PLT 213 223 236    Cardiac EnzymesNo results for input(s): TROPONINI in the last 168 hours.  Recent Labs  Lab 12/04/17 1849  TROPIPOC 0.13*     BNP Recent Labs  Lab 12/04/17 1536  BNP 739.8*     Radiology    No results found.  Cardiac Studies   Cardiac catheterization 12/09/2017:  Prox Cx lesion is 70% stenosed. Ost 1st Mrg to 1st Mrg lesion is 65% stenosed.  Prox Cx to Mid Cx lesion is 100% stenosed - after 1st Mrg  SVG-2nd Mrg: Origin lesion is 100% stenosed. Known CTO.  Prox LAD lesion is 75% stenosed pre-2nd Diag. Prox LAD to Mid LAD lesion is 100% stenosed AFTER 2nd  Diag & SP2 (SP1 & SP2 fill rPDA with faint collaterals)  LIMA-LAD graft was visualized by angiography. The graft exhibits no disease.  Ost 2nd Diag lesion is 100% stenosed.  SVG-Diag2 graft was visualized by angiography and is normal in caliber. The graft exhibits no disease --collateral branches fill 2nd Mrg into the distal Cx.  Prox RCA to Dist RCA lesion is 100% stenosed - known CTO.  NEW: Seq SVG- RPAV-dRCA graft was visualized by angiography and is large. Prox Graft to Mid Graft lesion before Acute Mrg is 100% stenosed.  LV end diastolic pressure is mildly elevated.   Newly found ostial occlusion of likely sequential SVG-AM-dRCA (proximal to stent from March 2017).  This is in addition to known CTO of SVG-OM 2 Severe native disease with occluded mid RCA, mid circumflex and mid LAD as well as Diag2 Now 2 out of 4-5 grafts remain patent -SVG-Diag2 is patent with collaterals filling OM 2 and distal Circumflex, LIMA-LAD is patent.  Suspect mild troponin elevation  was related to new onset A. fib RVR with ischemic cardiomyopathy and severe existing CAD.  TEE 12/07/2017: Study Conclusions  - Left ventricle: Moderate LVH. Systolic function was severely   reduced. The estimated ejection fraction was in the range of 25%   to 30%. Diffuse hypokinesis. - Aortic valve: Trileaflet; mildly thickened leaflets. There was   mild regurgitation. - Mitral valve: S/p Annuloplasty repair - visualized in 2D and 3D.   There is mild to moderate stenosis (aliasing and jet density) and   mild regurgitation. The posterior leaflet is mostly fixed. - Left atrium: Severely dilated with smoke. The appendage is   well-visualized with 2D and 3D modes- there is a small thrombus   noted at the apex of the appendage with very low emptying   velocities. - Right ventricle: Mildly reduced systolic function. - Right atrium: The atrium was dilated. - Atrial septum: No defect or patent foramen ovale was identified. - Tricuspid valve: Moderate regurgitation. Peak RV-RA gradient (S):   30 mm Hg + RAP.  Impressions:  - LAA thrombus idenitified. Cardioversion was not attempted. LVEF   25-30%, global hypokinesis with regional variation. s/p mitral   annuloplasty repair with mild to moderate stenosis and mild   regurgitation. Severe LAE with smoke, consistent with low flow   state and notably low appendage emptying velocities which is   conducive for thrombus formation.  Patient Profile     57 y.o. 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 Hospital course complicated by acute on chronic CHF and atrial flutter.  Assessment & Plan    1.  Persistent, typical atrial flutter with RVR.  Plan at this time is heart rate control and anticoagulation in light of  recently documented left atrial appendage thrombus conditions by TEE.  She has been seen by Dr. Johney Frame, ultimately with plan for TEE guided radiofrequency ablation after 4 weeks.  Heart rate control is suboptimal at this time, medication changes were just made recently.  2.  Multivessel CAD status post CABG with progressive graft disease documented by recent cardiac catheterization and plan for medical therapy.  3.  NSTEMI, troponin I 0.12-0.13.  Possibly type II event in the setting of rapid heart rate.  4.  Mitral valve annuloplasty repair, residual  mild to moderate stenosis and mild regurgitation by TEE.  5.  Cardiomyopathy with LVEF 25 to 30%.  6.  NSVT.  For now continue aspirin (NSTEMI), Eliquis, Lipitor, and Entresto.  Increase Toprol-XL to 75 mg twice daily and continue Lanoxin.  Rate control of atrial flutter will likely be difficult particularly if limited by blood pressure.  Signed, Nona Dell, MD  12/10/2017, 1:31 PM

## 2017-12-11 LAB — CBC
HCT: 42.6 % (ref 36.0–46.0)
Hemoglobin: 13.5 g/dL (ref 12.0–15.0)
MCH: 30.6 pg (ref 26.0–34.0)
MCHC: 31.7 g/dL (ref 30.0–36.0)
MCV: 96.6 fL (ref 78.0–100.0)
PLATELETS: 235 10*3/uL (ref 150–400)
RBC: 4.41 MIL/uL (ref 3.87–5.11)
RDW: 15.9 % — AB (ref 11.5–15.5)
WBC: 6.7 10*3/uL (ref 4.0–10.5)

## 2017-12-11 LAB — BASIC METABOLIC PANEL
Anion gap: 8 (ref 5–15)
BUN: 11 mg/dL (ref 6–20)
CALCIUM: 9 mg/dL (ref 8.9–10.3)
CHLORIDE: 105 mmol/L (ref 101–111)
CO2: 26 mmol/L (ref 22–32)
CREATININE: 0.93 mg/dL (ref 0.44–1.00)
GFR calc non Af Amer: >60 mL/min (ref >60)
Glucose, Bld: 112 mg/dL — ABNORMAL HIGH (ref 65–99)
Potassium: 4.1 mmol/L (ref 3.5–5.1)
SODIUM: 139 mmol/L (ref 135–145)

## 2017-12-11 MED ORDER — METOPROLOL SUCCINATE ER 100 MG PO TB24
100.0000 mg | ORAL_TABLET | Freq: Two times a day (BID) | ORAL | Status: DC
  Administered 2017-12-11 – 2017-12-13 (×4): 100 mg via ORAL
  Filled 2017-12-11 (×5): qty 1

## 2017-12-11 MED ORDER — ENSURE ENLIVE PO LIQD
237.0000 mL | Freq: Two times a day (BID) | ORAL | Status: DC
  Administered 2017-12-11 – 2017-12-13 (×2): 237 mL via ORAL

## 2017-12-11 NOTE — Progress Notes (Signed)
Progress Note  Patient Name: [name] Date of Encounter: 12/11/2017  Primary Cardiologist: Dr. Verdis Prime  Subjective   Feels fatigued, limited appetite.  No chest pain or breathlessness at rest.  Feels palpitations.  Inpatient Medications    Scheduled Meds: . apixaban  5 mg Oral BID  . aspirin EC  81 mg Oral Daily  . atorvastatin  80 mg Oral Daily  . digoxin  0.25 mg Oral Daily  . metoprolol succinate  75 mg Oral BID  . multivitamin with minerals  1 tablet Oral Daily  . sacubitril-valsartan  1 tablet Oral BID  . triamcinolone cream  1 application Topical QHS   Continuous Infusions: . sodium chloride     PRN Meds: sodium chloride, acetaminophen, nitroGLYCERIN, ondansetron (ZOFRAN) IV, sodium chloride flush   Vital Signs    Vitals:   12/10/17 1938 12/11/17 0053 12/11/17 0442 12/11/17 1032  BP: 127/90 122/88 130/82 (!) 149/112  Pulse: (!) 102 (!) 129 (!) 129 (!) 130  Resp: Temp: 98 F (36.7 C) 98 F (36.7 C) 98.2 F (36.8 C)   TempSrc: Oral Oral Oral   SpO2: 100% 95% 98%   Weight:   162 lb 4.8 oz (73.6 kg)   Height:        Intake/Output Summary (Last 24 hours) at 12/11/2017 1038 Last data filed at 12/11/2017 0900 Gross per 24 hour  Intake 340 ml  Output -  Net 340 ml   Filed Weights   12/09/17 0449 12/10/17 0429 12/11/17 0442  Weight: 160 lb 12.8 oz (72.9 kg) 162 lb 4.8 oz (73.6 kg) 162 lb 4.8 oz (73.6 kg)    Telemetry    Atrial flutter with 2:1 block, burst of NSVT noted.  Personally reviewed.  Physical Exam   GEN: No acute distress.   Neck: No JVD. Cardiac:  Rapid RRR, no gallop.  Respiratory: Nonlabored. Clear to auscultation bilaterally. GI: Soft, nontender, bowel sounds present. MS: No edema; No deformity. Neuro:  Nonfocal. Psych: Alert and oriented x 3. Normal affect.  Labs    Chemistry Recent Labs  Lab 12/05/17 0234 12/07/17 0310 12/11/17 0301  NA 142 143 139  K 3.7 4.0 4.1  CL 103 106 105  CO2 GLUCOSE 102* 105* 112*  BUN CREATININE 1.12* 1.03* 0.93  CALCIUM 9.2 9.3 9.0  GFRNONAA 53* 58* >60  GFRAA >60 >60 >60  ANIONGAP Hematology Recent Labs  Lab 12/08/17 0340 12/09/17 0428 12/11/17 0301  WBC 6.2 6.3 6.7  RBC 4.19 4.51 4.41  HGB 12.5 13.5 13.5  HCT 40.1 42.5 42.6  MCV 95.7 94.2 96.6  MCH 29.8 29.9 30.6  MCHC 31.2 31.8 31.7  RDW 16.0* 15.8* 15.9*  PLT 223 236 235    Cardiac EnzymesNo results for input(s): TROPONINI in the last 168 hours.  Recent Labs  Lab 12/04/17 1849  TROPIPOC 0.13*     BNP Recent Labs  Lab 12/04/17 1536  BNP 739.8*     Radiology    No results found.  Cardiac Studies   Cardiac catheterization 12/09/2017:  Prox Cx lesion is 70% stenosed. Ost 1st Mrg to 1st Mrg lesion is 65% stenosed.  Prox Cx to Mid Cx lesion is 100% stenosed - after 1st Mrg  SVG-2nd Mrg: Origin lesion is 100% stenosed. Known CTO.  Prox LAD lesion is 75% stenosed pre-2nd Diag. Prox LAD to Mid LAD lesion is  100% stenosed AFTER 2nd Diag & SP2 (SP1 & SP2 fill rPDA with faint collaterals)  LIMA-LAD graft was visualized by angiography. The graft exhibits no disease.  Ost 2nd Diag lesion is 100% stenosed.  SVG-Diag2 graft was visualized by angiography and is normal in caliber. The graft exhibits no disease --collateral branches fill 2nd Mrg into the distal Cx.  Prox RCA to Dist RCA lesion is 100% stenosed - known CTO.  NEW: Seq SVG- RPAV-dRCA graft was visualized by angiography and is large. Prox Graft to Mid Graft lesion before Acute Mrg is 100% stenosed.  LV end diastolic pressure is mildly elevated.  Newly found ostial occlusion of likely sequential SVG-AM-dRCA (proximal to stent from March 2017). This is in addition to known CTO of SVG-OM 2 Severe native disease with occluded mid RCA, mid circumflex and mid LAD as well as Diag2 Now 2 out of 4-5 grafts remain patent -SVG-Diag2 is patent with collaterals filling OM 2 and distal  Circumflex, LIMA-LAD is patent.  Suspect mild troponin elevation was related to new onset A. fib RVR with ischemic cardiomyopathy and severe existing CAD.  TEE 12/07/2017: Study Conclusions  - Left ventricle: Moderate LVH. Systolic function was severely reduced. The estimated ejection fraction was in the range of 25% to 30%. Diffuse hypokinesis. - Aortic valve: Trileaflet; mildly thickened leaflets. There was mild regurgitation. - Mitral valve: S/p Annuloplasty repair - visualized in 2D and 3D. There is mild to moderate stenosis (aliasing and jet density) and mild regurgitation. The posterior leaflet is mostly fixed. - Left atrium: Severely dilated with smoke. The appendage is well-visualized with 2D and 3D modes- there is a small thrombus noted at the apex of the appendage with very low emptying velocities. - Right ventricle: Mildly reduced systolic function. - Right atrium: The atrium was dilated. - Atrial septum: No defect or patent foramen ovale was identified. - Tricuspid valve: Moderate regurgitation. Peak RV-RA gradient (S): 30 mm Hg + RAP.  Impressions:  - LAA thrombus idenitified. Cardioversion was not attempted. LVEF 25-30%, global hypokinesis with regional variation. s/p mitral annuloplasty repair with mild to moderate stenosis and mild regurgitation. Severe LAE with smoke, consistent with low flow state and notably low appendage emptying velocities which is conducive for thrombus formation.  Patient Profile     57 y.o. 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 Hospital course complicated by acute on chronic CHF and atrial flutter.  Assessment & Plan    1.  Persistent, typical atrial flutter with RVR.  Plan  is currently for heart rate control and anticoagulation rather than cardioversion in light of recently documented left atrial appendage thrombus conditions by TEE.  Ultimately, TEE guided radiofrequency ablation is planned by Dr. Johney Frame.  She has not had adequate heart rate control as yet.  Beta blocker dose is being escalated.  2.  Multivessel CAD status post CABG with progressive graft disease by recent cardiac catheterization and plan for medical management.  3.  NSTEMI, troponin I 0.12-0.13.  Likely type II event in the setting of elevated heart rates.  4.  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  5.  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  6.  NSVT.  Increase Toprol-XL to 100  mg twice daily, continue Lanoxin.  She also remains on ASA (NSTEMI), Eliquis, Lipitor, and Entresto.  Anticipate difficulty controlling heart rate in atrial flutter.  She apparently had better heart rates earlier when on IV diltiazem, however calcium channel blocker is not optimal with her cardiomyopathy.  If heart rate does not come down on high-dose beta-blocker, may have to add Cardizem since she cannot be cardioverted at this time.  Would not want to use amiodarone for this reason either.  Signed, Nona Dell, MD  12/11/2017, 10:38 AM

## 2017-12-11 NOTE — Plan of Care (Signed)
Care plans reviewed and patient is progressing.  

## 2017-12-12 LAB — CBC
HEMATOCRIT: 42.1 % (ref 36.0–46.0)
Hemoglobin: 13.3 g/dL (ref 12.0–15.0)
MCH: 30.4 pg (ref 26.0–34.0)
MCHC: 31.6 g/dL (ref 30.0–36.0)
MCV: 96.3 fL (ref 78.0–100.0)
Platelets: 232 10*3/uL (ref 150–400)
RBC: 4.37 MIL/uL (ref 3.87–5.11)
RDW: 15.7 % — AB (ref 11.5–15.5)
WBC: 7.1 10*3/uL (ref 4.0–10.5)

## 2017-12-12 LAB — BASIC METABOLIC PANEL
ANION GAP: 9 (ref 5–15)
BUN: 12 mg/dL (ref 6–20)
CALCIUM: 9.3 mg/dL (ref 8.9–10.3)
CO2: 25 mmol/L (ref 22–32)
CREATININE: 0.79 mg/dL (ref 0.44–1.00)
Chloride: 108 mmol/L (ref 101–111)
Glucose, Bld: 107 mg/dL — ABNORMAL HIGH (ref 65–99)
Potassium: 4.3 mmol/L (ref 3.5–5.1)
SODIUM: 142 mmol/L (ref 135–145)

## 2017-12-12 MED ORDER — DILTIAZEM HCL 60 MG PO TABS
30.0000 mg | ORAL_TABLET | Freq: Four times a day (QID) | ORAL | Status: DC
  Administered 2017-12-12 – 2017-12-13 (×3): 30 mg via ORAL
  Filled 2017-12-12 (×3): qty 1

## 2017-12-12 NOTE — Plan of Care (Signed)
Care plans reviewed and patient is progressing.  

## 2017-12-12 NOTE — Progress Notes (Signed)
Progress Note  Patient Name: [name] Date of Encounter: 12/12/2017  Primary Cardiologist: Dr. Verdis Prime  Subjective   Mild fatigue; no CP; mild DOE  Inpatient Medications    Scheduled Meds: . apixaban  5 mg Oral BID  . aspirin EC  81 mg Oral Daily  . atorvastatin  80 mg Oral Daily  . digoxin  0.25 mg Oral Daily  . feeding supplement (ENSURE ENLIVE)  237 mL Oral BID BM  . metoprolol succinate  100 mg Oral BID  . multivitamin with minerals  1 tablet Oral Daily  . sacubitril-valsartan  1 tablet Oral BID  . triamcinolone cream  1 application Topical QHS   Continuous Infusions: . sodium chloride     PRN Meds: sodium chloride, acetaminophen, nitroGLYCERIN, ondansetron (ZOFRAN) IV, sodium chloride flush   Vital Signs    Vitals:   12/12/17 0011 12/12/17 0448 12/12/17 0800 12/12/17 1245  BP: (!) 132/96 (!) 135/95 134/86 (!) 149/88  Pulse: 99 (!) 120 (!) 122 (!) 102  Resp: (!) 23  Temp: 98.5 F (36.9 C) 97.9 F (36.6 C) (!) 97.1 F (36.2 C) (!) 97.5 F (36.4 C)  TempSrc: Oral Oral Oral Oral  SpO2: 97% 98% 96% 98%  Weight:  162 lb 1.6 oz (73.5 kg)    Height:        Intake/Output Summary (Last 24 hours) at 12/12/2017 1306 Last data filed at 12/12/2017 0900 Gross per 24 hour  Intake 650 ml  Output -  Net 650 ml   Filed Weights   12/10/17 0429 12/11/17 0442 12/12/17 0448  Weight: 162 lb 4.8 oz (73.6 kg) 162 lb 4.8 oz (73.6 kg) 162 lb 1.6 oz (73.5 kg)    Telemetry    Atrial flutter with 2:1 block, rate mildly elevated at times.  Personally reviewed.  Physical Exam   GEN: WD/WN no acute distress.   Neck: No JVD; supple. Cardiac:  irregular Respiratory: mildly diminished BS bases GI: Soft, NT/ND MS: No edema Neuro: Grossly intact Psych: Normal  Labs    Chemistry Recent Labs  Lab 12/07/17 0310 12/11/17 0301 12/12/17 0254  NA 143 139 142  K 4.0 4.1 4.3  CL 106 105 108  CO2 GLUCOSE 105* 112* 107*  BUN CREATININE  1.03* 0.93 0.79  CALCIUM 9.3 9.0 9.3  GFRNONAA 58* >60 >60  GFRAA >60 >60 >60  ANIONGAP Hematology Recent Labs  Lab 12/09/17 0428 12/11/17 0301 12/12/17 0254  WBC 6.3 6.7 7.1  RBC 4.51 4.41 4.37  HGB 13.5 13.5 13.3  HCT 42.5 42.6 42.1  MCV 94.2 96.6 96.3  MCH 29.9 30.6 30.4  MCHC 31.8 31.7 31.6  RDW 15.8* 15.9* 15.7*  PLT 236 235 232    Cardiac Studies   Cardiac catheterization 12/09/2017:  Prox Cx lesion is 70% stenosed. Ost 1st Mrg to 1st Mrg lesion is 65% stenosed.  Prox Cx to Mid Cx lesion is 100% stenosed - after 1st Mrg  SVG-2nd Mrg: Origin lesion is 100% stenosed. Known CTO.  Prox LAD lesion is 75% stenosed pre-2nd Diag. Prox LAD to Mid LAD lesion is 100% stenosed AFTER 2nd Diag & SP2 (SP1 & SP2 fill rPDA with faint collaterals)  LIMA-LAD graft was visualized by angiography. The graft exhibits no disease.  Ost 2nd Diag lesion is 100% stenosed.  SVG-Diag2 graft was visualized by angiography and is normal in caliber. The graft exhibits no  disease --collateral branches fill 2nd Mrg into the distal Cx.  Prox RCA to Dist RCA lesion is 100% stenosed - known CTO.  NEW: Seq SVG- RPAV-dRCA graft was visualized by angiography and is large. Prox Graft to Mid Graft lesion before Acute Mrg is 100% stenosed.  LV end diastolic pressure is mildly elevated.  Newly found ostial occlusion of likely sequential SVG-AM-dRCA (proximal to stent from March 2017). This is in addition to known CTO of SVG-OM 2 Severe native disease with occluded mid RCA, mid circumflex and mid LAD as well as Diag2 Now 2 out of 4-5 grafts remain patent -SVG-Diag2 is patent with collaterals filling OM 2 and distal Circumflex, LIMA-LAD is patent.  Suspect mild troponin elevation was related to new onset A. fib RVR with ischemic cardiomyopathy and severe existing CAD.  TEE 12/07/2017: Study Conclusions  - Left ventricle: Moderate LVH. Systolic function was severely reduced. The  estimated ejection fraction was in the range of 25% to 30%. Diffuse hypokinesis. - Aortic valve: Trileaflet; mildly thickened leaflets. There was mild regurgitation. - Mitral valve: S/p Annuloplasty repair - visualized in 2D and 3D. There is mild to moderate stenosis (aliasing and jet density) and mild regurgitation. The posterior leaflet is mostly fixed. - Left atrium: Severely dilated with smoke. The appendage is well-visualized with 2D and 3D modes- there is a small thrombus noted at the apex of the appendage with very low emptying velocities. - Right ventricle: Mildly reduced systolic function. - Right atrium: The atrium was dilated. - Atrial septum: No defect or patent foramen ovale was identified. - Tricuspid valve: Moderate regurgitation. Peak RV-RA gradient (S): 30 mm Hg + RAP.  Impressions:  - LAA thrombus idenitified. Cardioversion was not attempted. LVEF 25-30%, global hypokinesis with regional variation. s/p mitral annuloplasty repair with mild to moderate stenosis and mild regurgitation. Severe LAE with smoke, consistent with low flow state and notably low appendage emptying velocities which is conducive for thrombus formation.  Patient Profile     57 y.o. 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 Hospital course complicated by acute on chronic CHF and atrial flutter.  Assessment & Plan    1.  Persistent typical atrial flutter-heart rate remains elevated.  Continue digoxin and Toprol.  Add low-dose Cardizem at 30 mg every 6 hours.  Change to Cardizem CD tomorrow once it is clear dose is appropriate.  Plan is for rate control and anticoagulation as she was found to have left atrial appendage thrombus on prior TEE.   After 4 weeks of anticoagulation she will return for TEE guided atrial flutter ablation.    2.  Non-ST elevation myocardial infarction-cardiac catheterization results noted.  Plan is for medical therapy.  She is on aspirin and statin.  3.  Ischemic cardiomyopathy-continue beta-blocker and Entresto.  Repeat echocardiogram 3 months after atrial flutter ablation.  If ejection fraction less than 35% would need to consider ICD.  4.  Mitral valve annuloplasty repair with residual mild to moderate stenosis and mild regurgitation by recent TEE-plan medical therapy.  Patient can likely be discharged in next 24 to 48 hours if heart rate improves.  Signed, Olga Millers, MD  12/12/2017, 1:06 PM

## 2017-12-13 MED ORDER — APIXABAN 5 MG PO TABS: 5.0000 mg | ORAL_TABLET | Freq: Two times a day (BID) | ORAL | 3 refills | Status: DC

## 2017-12-13 MED ORDER — DILTIAZEM HCL ER COATED BEADS 120 MG PO CP24: 120.0000 mg | ORAL_CAPSULE | Freq: Every day | ORAL | 3 refills | Status: DC

## 2017-12-13 MED ORDER — DIGOXIN 250 MCG PO TABS: 0.2500 mg | ORAL_TABLET | Freq: Every day | ORAL | 3 refills | Status: DC

## 2017-12-13 MED ORDER — SACUBITRIL-VALSARTAN 49-51 MG PO TABS: 1.0000 | ORAL_TABLET | Freq: Two times a day (BID) | ORAL | 3 refills | Status: DC

## 2017-12-13 MED ORDER — DILTIAZEM HCL ER COATED BEADS 120 MG PO CP24
120.0000 mg | ORAL_CAPSULE | Freq: Every day | ORAL | Status: DC
  Administered 2017-12-13: 120 mg via ORAL
  Filled 2017-12-13: qty 1

## 2017-12-13 MED ORDER — METOPROLOL SUCCINATE ER 100 MG PO TB24: 100.0000 mg | ORAL_TABLET | Freq: Two times a day (BID) | ORAL | 3 refills | Status: DC

## 2017-12-13 MED ORDER — ENSURE ENLIVE PO LIQD: 237.0000 mL | Freq: Two times a day (BID) | ORAL | 12 refills | Status: DC

## 2017-12-13 NOTE — Progress Notes (Signed)
Pt has been on 2l 02 throughout most of night, 02 saturation 100%. Sob with activities. Stated O2 has helped her a lot and feels better today. Willl continue to monitor.

## 2017-12-13 NOTE — Discharge Summary (Addendum)
Discharge Summary    Patient ID: [name],  MRN: [id number], DOB/AGE: [birth year] 59 y.o.  Admit date: 12/04/2017 Discharge date: 12/13/2017  Primary Care Provider: No primary care provider on file. Primary Cardiologist: Lesleigh Noe, MD  Discharge Diagnoses    Principal Problem:   New onset atrial fibrillation [facility]) Active Problems:   Chronic combined systolic and diastolic HF (heart failure), NYHA class 2 (HCC)   Coronary artery disease involving coronary bypass graft of native heart with angina pectoris (HCC)   NSTEMI (non-ST elevated myocardial infarction) (HCC)   Allergies Allergies  Allergen Reactions  . Other Itching    GEL ON ELECTRODES FOR EKG    Diagnostic Studies/Procedures    Left heart catheterization:  Conclusion     Prox Cx lesion is 70% stenosed. Ost 1st Mrg to 1st Mrg lesion is 65% stenosed.  Prox Cx to Mid Cx lesion is 100% stenosed - after 1st Mrg  SVG-2nd Mrg: Origin lesion is 100% stenosed. Known CTO.  Prox LAD lesion is 75% stenosed pre-2nd Diag. Prox LAD to Mid LAD lesion is 100% stenosed AFTER 2nd Diag & SP2 (SP1 & SP2 fill rPDA with faint collaterals)  LIMA-LAD graft was visualized by angiography. The graft exhibits no disease.  Ost 2nd Diag lesion is 100% stenosed.  SVG-Diag2 graft was visualized by angiography and is normal in caliber. The graft exhibits no disease --collateral branches fill 2nd Mrg into the distal Cx.  Prox RCA to Dist RCA lesion is 100% stenosed - known CTO.  NEW: Seq SVG- RPAV-dRCA graft was visualized by angiography and is large. Prox Graft to Mid Graft lesion before Acute Mrg is 100% stenosed.  LV end diastolic pressure is mildly elevated.   Newly found ostial occlusion of likely sequential SVG-AM-dRCA (proximal to stent from March 2017).  This is in addition to known CTO of SVG-OM 2 Severe native disease with occluded mid RCA, mid circumflex and mid LAD as well as Diag2 Now 2 out of 4-5 grafts  remain patent -SVG-Diag2 is patent with collaterals filling OM 2 and distal Circumflex, LIMA-LAD is patent.  Suspect mild troponin elevation was related to new onset A. fib RVR with ischemic cardiomyopathy and severe existing CAD.  Plan: Return to nursing unit with TR band removal per protocol. Restart oral anticoagulation (Eliquis) 6 hours after TR band removal Continue CHF management.   TEE 12/07/17: Study Conclusions  - Left ventricle: Moderate LVH. Systolic function was severely   reduced. The estimated ejection fraction was in the range of 25%   to 30%. Diffuse hypokinesis. - Aortic valve: Trileaflet; mildly thickened leaflets. There was   mild regurgitation. - Mitral valve: S/p Annuloplasty repair - visualized in 2D and 3D.   There is mild to moderate stenosis (aliasing and jet density) and   mild regurgitation. The posterior leaflet is mostly fixed. - Left atrium: Severely dilated with smoke. The appendage is   well-visualized with 2D and 3D modes- there is a small thrombus   noted at the apex of the appendage with very low emptying   velocities. - Right ventricle: Mildly reduced systolic function. - Right atrium: The atrium was dilated. - Atrial septum: No defect or patent foramen ovale was identified. - Tricuspid valve: Moderate regurgitation. Peak RV-RA gradient (S):   30 mm Hg + RAP.  Impressions:  - LAA thrombus idenitified. Cardioversion was not attempted. LVEF   25-30%, global hypokinesis with regional variation. s/p mitral   annuloplasty repair with mild to  moderate stenosis and mild   regurgitation. Severe LAE with smoke, consistent with low flow   state and notably low appendage emptying velocities which is   conducive for thrombus formation.  Echocardiogram 12/05/17: Study Conclusions  - Left ventricle: False tendon in mid LV cavity of no clinical   significance. The cavity size was mildly dilated. There was   moderate asymmetric hypertrophy. Systolic  function was severely   reduced. The estimated ejection fraction was in the range of 25%   to 30%. Severe diffuse hypokinesis with distinct regional wall   motion abnormalities. There is akinesis of the inferolateral   myocardium. The study was not technically sufficient to allow   evaluation of LV diastolic dysfunction due to atrial   fibrillation. Doppler parameters are consistent with high   ventricular filling pressure. - Ventricular septum: The contour showed diastolic flattening and   systolic flattening. These changes are consistent with RV volume   and pressure overload. - Aortic valve: There was trivial regurgitation. - Mitral valve: Mobility of the posterior leaflet was restricted to   the point of immobility. The findings are consistent with   moderate stenosis. There was mild regurgitation directed   eccentrically. Mean gradient (D): 9 mm Hg. Valve area by   continuity equation (using LVOT flow): 1.02 cm^2. - Left atrium: The atrium was severely dilated. - Right ventricle: The cavity size was mildly dilated. Wall   thickness was normal. Systolic function was moderately reduced. - Pulmonary arteries: PA peak pressure: 59 mm Hg (S).  Impressions:  - The right ventricular systolic pressure was increased consistent   with moderate pulmonary hypertension.  _____________   History of Present Illness     57 yo. Female with PMH of HTN, HLD, chronic systolic CHF, CAD s/p CABG 2007 with subsequent PCI to SVG 2017 for NSTEMI, who presented with chest pain. She reported chest pain starting 12/02/17. She stated the pain was centrally located, worse with exertion, and denied alleviating factors. She reported som SOB over this period as well. She reported possibly feeling an abnormal heart beat but was not completely sure of this. She reported compliance with medication. She reported a history of atrial fibrillation and had been on coumadin in the past, however this was discontinued for  unclear reasons and she reported being off [place] for many years. In the ED, she was found to be in atrial flutter with variable AV conduction and to have an elevated troponin. She was admitted to cardiology for further management.  Hospital Course     Consultants: EP   1. Atrial flutter: she was noted to have atrial flutter in the past, however on chart review, EKGs dating back to 2017 all reveal sinus rhythm. She had been on coumadin in the past, however this was discontinued and she has not been on [facility] in years. She was started on eliquis  BID for CHADS2Vasc score of 4 (Vasc hx, CHF, HTN, and female). Rate controlled with diltiazem and metoprolol. She underwent TEE 12/07/17 with plans for DCCV, however was found to have a LAA thrombus and DCCV was aborted. She was evaluated by EP and recommended for TEE guided atrial flutter ablation after 4 weeks of anticoagulation.  - Discharged home on eliquis  BID anticoagulation - Discharged home on digoxin, diltiazem, and metoprolol for rate control  2. NSTEMI in patient with CAD s/p CABG and subsequent PCI to SVG: patient presented with chest pain. Trop peaked at 0.13. EKG without ischemic changes. She underwent LHC  12/09/17 which revealed progressive graft disease (details above) and was recommended for medical management. Event likely type II in the setting of Aflutter with RVR. Home plavix discontinued given need for anticoagulation - Continue ASA and statin  3. Ischemic cardiomyopathy: EF noted to be 25-30% this admission. Possibly in part tachycardia mediated. She was started on entresto this admission.  - Recommend repeat echocardiogram 3 months after Aflutter ablation to monitor for improvement. If EF remains <35%, may need to consider ICD  - Continue metoprolol and entresto  4. LA appendage thrombus: noted on TEE and DCCV aborted.  - Discharged home on eliquis 5mg  BID  5. Prior mitral valve repair with annuloplasty ring: noted to have  mild-moderate stenosis and mild regurgitation on echo this admission.  - Continue routine monitoring   _____________  Discharge Vitals Blood pressure (!) 142/99, pulse 76, temperature (!) 97.5 F (36.4 C), temperature source Oral, resp. rate 18, height 5\' 2"  (1.575 m), weight 162 lb 3.2 oz (73.6 kg), SpO2 99 %.  Filed Weights   12/11/17 0442 12/12/17 0448 12/13/17 0517  Weight: 162 lb 4.8 oz (73.6 kg) 162 lb 1.6 oz (73.5 kg) 162 lb 3.2 oz (73.6 kg)    Labs & Radiologic Studies    CBC Recent Labs    12/11/17 0301 12/12/17 0254  WBC 6.7 7.1  HGB 13.5 13.3  HCT 42.6 42.1  MCV 96.6 96.3  PLT 235 232   Basic Metabolic Panel Recent Labs    16/10/96 0301 12/12/17 0254  NA 139 142  K 4.1 4.3  CL 105 108  CO2 26 25  GLUCOSE 112* 107*  BUN 11 12  CREATININE 0.93 0.79  CALCIUM 9.0 9.3   Liver Function Tests No results for input(s): AST, ALT, ALKPHOS, BILITOT, PROT, ALBUMIN in the last 72 hours. No results for input(s): LIPASE, AMYLASE in the last 72 hours. Cardiac Enzymes No results for input(s): CKTOTAL, CKMB, CKMBINDEX, TROPONINI in the last 72 hours. BNP Invalid input(s): POCBNP D-Dimer No results for input(s): DDIMER in the last 72 hours. Hemoglobin A1C No results for input(s): HGBA1C in the last 72 hours. Fasting Lipid Panel No results for input(s): CHOL, HDL, LDLCALC, TRIG, CHOLHDL, LDLDIRECT in the last 72 hours. Thyroid Function Tests No results for input(s): TSH, T4TOTAL, T3FREE, THYROIDAB in the last 72 hours.  Invalid input(s): FREET3 _____________  Dg Chest 2 View  Result Date: 12/04/2017 CLINICAL DATA:  Chest pain and shortness of breath EXAM: CHEST - 2 VIEW COMPARISON:  Chest radiograph 10/13/2015. FINDINGS: Monitoring leads overlie the patient. Marked enlargement cardiopericardial silhouette. Patient status post median sternotomy and CABG procedure. Mild retrocardiac consolidation. No pleural effusion or pneumothorax. Thoracic spine degenerative  changes. IMPRESSION: Marked enlargement of the cardiopericardial silhouette which may represent cardiomegaly. Pericardial effusion not excluded. Heterogeneous opacities left lung base may represent atelectasis. Electronically Signed   By: Annia Belt M.D.   On: 12/04/2017 16:13   Ct Angio Chest Pe W And/or Wo Contrast  Result Date: 12/04/2017 CLINICAL DATA:  57 year old female with acute shortness of breath and chest pain. EXAM: CT ANGIOGRAPHY CHEST WITH CONTRAST TECHNIQUE: Multidetector CT imaging of the chest was performed using the standard protocol during bolus administration of intravenous contrast. Multiplanar CT image reconstructions and MIPs were obtained to evaluate the vascular anatomy. CONTRAST:  ISOVUE-370 IOPAMIDOL (ISOVUE-370) INJECTION 76% COMPARISON:  12/04/2017 and prior chest radiographs. 03/25/2006 chest CT FINDINGS: Cardiovascular: This is a technically satisfactory study. No pulmonary emboli are identified. Cardiomegaly, CABG and mitral valve  replacement changes noted. Thoracic aortic atherosclerotic calcifications identified without aneurysm. No pericardial effusion. Mediastinum/Nodes: No enlarged mediastinal, hilar, or axillary lymph nodes. Thyroid gland, trachea, and esophagus demonstrate no significant findings. Lungs/Pleura: Mild interlobular septal thickening is identified, RIGHT greater than LEFT and may represent mild interstitial edema. A small RIGHT pleural effusion is present. Mild bibasilar atelectasis identified. Subcentimeter nodules within the LEFT UPPER lobe and RIGHT LOWER lobe are unchanged-benign. No mass or pneumothorax. Upper Abdomen: No acute abnormality Musculoskeletal: No acute or suspicious bony abnormalities. Review of the MIP images confirms the above findings. IMPRESSION: 1. No evidence of pulmonary emboli 2. Mild interlobular septal thickening, RIGHT greater than LEFT, which may represent mild interstitial edema. 3. Small RIGHT pleural effusion 4.  Cardiomegaly, CABG and mitral valve replacement changes. 5.  Aortic Atherosclerosis (ICD10-I70.0). Electronically Signed   By: Harmon Pier M.D.   On: 12/04/2017 18:49   Disposition   Patient was seen and examined by Dr. Anne Fu who deemed patient as stable for discharge. Follow-up has been arranged. Discharge medications as listed below.   Follow-up Plans & Appointments    Follow-up Information    Hillis Range, MD Follow up on 12/28/2017.   Specialty:  Cardiology Why:  at 12:30PM  Contact information: [address] [place] [address] [phone]        [name], NP Follow up on 12/26/2017.   Specialties:  Nurse Practitioner, Interventional Cardiology, Cardiology, Radiology Why:  Please arrive 15 minutes early for your 10:00am cardiology appoitnment Contact information: [address] [place] [address] [phone]          Discharge Instructions    Amb Referral to Cardiac Rehabilitation   Complete by:  As directed    Needs ablation in future prior to program   Diagnosis:  NSTEMI   Diet - low sodium heart healthy   Complete by:  As directed    Increase activity slowly   Complete by:  As directed       Discharge Medications   Allergies as of 12/13/2017      Reactions   Other Itching   GEL ON ELECTRODES FOR EKG      Medication List    STOP taking these medications   acetaminophen 500 MG tablet Commonly known as:  TYLENOL   carvedilol 12.5 MG tablet Commonly known as:  COREG   clopidogrel 75 MG tablet Commonly known as:  PLAVIX   lisinopril 40 MG tablet Commonly known as:  PRINIVIL,ZESTRIL     TAKE these medications   apixaban 5 MG Tabs tablet Commonly known as:  ELIQUIS Take 1 tablet (5 mg total) by mouth 2 (two) times daily.   aspirin EC 81 MG tablet Take 1 tablet (81 mg total) by mouth daily.   atorvastatin 80 MG tablet Commonly known as:  LIPITOR TAKE 1 TABLET (80 MG TOTAL) BY MOUTH DAILY AT 6  PM. What changed:  when to take this   digoxin 0.25 MG tablet Commonly known as:  LANOXIN Take 1 tablet (0.25 mg total) by mouth daily. Start taking on:  12/14/2017   diltiazem 120 MG 24 hr capsule Commonly known as:  CARDIZEM CD Take 1 capsule (120 mg total) by mouth daily.   feeding supplement (ENSURE ENLIVE) Liqd Take 237 mLs by mouth 2 (two) times daily between meals.   furosemide 40 MG tablet Commonly known as:  LASIX Take 1 tablet (40 mg total) by mouth daily.   metoprolol succinate 100  MG 24 hr tablet Commonly known as:  TOPROL-XL Take 1 tablet (100 mg total) by mouth 2 (two) times daily. Take with or immediately following a meal.   multivitamin with minerals Tabs tablet Take 1 tablet by mouth daily.   nitroGLYCERIN 0.4 MG SL tablet Commonly known as:  NITROSTAT Place 0.4 mg under the tongue every 5 (five) minutes as needed for chest pain (up to 3 doses before calling EMS).   sacubitril-valsartan 49-51 MG Commonly known as:  ENTRESTO Take 1 tablet by mouth 2 (two) times daily.   triamcinolone cream 0.1 % Commonly known as:  KENALOG Apply 1 application topically at bedtime.         Outstanding Labs/Studies   Anticipate TEE/ablation in 4 weeks. Patient to see Dr. Johney Frame 12/28/17 for further recommendations.  Would benefit from a repeat echo 3 months after ablation to monitor for improvement in EF  Duration of Discharge Encounter   Greater than 30 minutes including physician time.  Signed, Beatriz Stallion PA-C 12/13/2017, 12:31 PM  Primary Cardiologist: Lesleigh Noe, MD   Subjective   Feels well, eager to go home.  Today is her anniversary.  No chest pain, no shortness of breath.  Medications consolidated.  Inpatient Medications    Scheduled Meds: . apixaban  5 mg Oral BID  . aspirin EC  81 mg Oral Daily  . atorvastatin  80 mg Oral Daily  . digoxin  0.25 mg Oral Daily  . diltiazem  120 mg Oral Daily  . feeding supplement (ENSURE ENLIVE)   237 mL Oral BID BM  . metoprolol succinate  100 mg Oral BID  . multivitamin with minerals  1 tablet Oral Daily  . sacubitril-valsartan  1 tablet Oral BID  . triamcinolone cream  1 application Topical QHS   Continuous Infusions: . sodium chloride     PRN Meds: sodium chloride, acetaminophen, nitroGLYCERIN, ondansetron (ZOFRAN) IV, sodium chloride flush   Vital Signs          Vitals:   12/12/17 1938 12/12/17 2326 12/13/17 0517 12/13/17 0845  BP: (!) 150/103 (!) 158/97 (!) 139/95 (!) 163/89  Pulse: (!) 129 95 (!) 124 90  Resp: (!) 21  Temp: 97.6 F (36.4 C) 97.9 F (36.6 C) 97.8 F (36.6 C) (!) 97.5 F (36.4 C)  TempSrc: Oral Oral Oral Oral  SpO2: 100% 100% 94% 99%  Weight:   162 lb 3.2 oz (73.6 kg)   Height:        Intake/Output Summary (Last 24 hours) at 12/13/2017 0958 Last data filed at 12/13/2017 0300    Gross per 24 hour  Intake 400 ml  Output -  Net 400 ml        Filed Weights   12/11/17 0442 12/12/17 0448 12/13/17 0517  Weight: 162 lb 4.8 oz (73.6 kg) 162 lb 1.6 oz (73.5 kg) 162 lb 3.2 oz (73.6 kg)    Telemetry    Atrial flutter, improved rate control less than 100- Personally Reviewed  ECG    No new EKG- Personally Reviewed  Physical Exam   GEN:No acute distress.   Neck:No JVD Cardiac:irreg irreg, no murmurs, rubs, or gallops.  Respiratory:Clear to auscultation bilaterally. ZO:XWRU, nontender, non-distended  MS:No edema; No deformity. Neuro:Nonfocal  Psych: Normal affect   Labs    Chemistry LastLabs       Recent Labs  Lab 12/07/17 0310 12/11/17 0301 12/12/17 0254  NA 143 139 142  K 4.0 4.1 4.3  CL 106 105 108  CO2 26 26 25   GLUCOSE 105* 112* 107*  BUN 12 11 12   CREATININE 1.03* 0.93 0.79  CALCIUM 9.3 9.0 9.3  GFRNONAA 58* >60 >60  GFRAA >60 >60 >60  ANIONGAP 11 8 9        Hematology LastLabs  Recent Labs  Lab 12/09/17 0428 12/11/17 0301 12/12/17 0254  WBC 6.3 6.7 7.1  RBC 4.51  4.41 4.37  HGB 13.5 13.5 13.3  HCT 42.5 42.6 42.1  MCV 94.2 96.6 96.3  MCH 29.9 30.6 30.4  MCHC 31.8 31.7 31.6  RDW 15.8* 15.9* 15.7*  PLT 236 235 232      Cardiac Enzymes LastLabs  No results for input(s): TROPONINI in the last 168 hours.    LastLabs  No results for input(s): TROPIPOC in the last 168 hours.     BNP LastLabs  No results for input(s): BNP, PROBNP in the last 168 hours.     DDimer  Beau Fanny  No results for input(s): DDIMER in the last 168 hours.     Radiology    ImagingResults(Last48hours)  No results found.    Cardiac Studies   TEE on 12/07/2017 - EF 25 to 30% - Left atrium with small thrombus -Mild mitral regurgitation post mitral valve repair.  Mild to moderate stenosis.   Cardiac catheterization 12/09/2017:  Prox Cx lesion is 70% stenosed. Ost 1st Mrg to 1st Mrg lesion is 65% stenosed.  Prox Cx to Mid Cx lesion is 100% stenosed - after 1st Mrg  SVG-2nd Mrg: Origin lesion is 100% stenosed. Known CTO.  Prox LAD lesion is 75% stenosed pre-2nd Diag. Prox LAD to Mid LAD lesion is 100% stenosed AFTER 2nd Diag & SP2 (SP1 & SP2 fill rPDA with faint collaterals)  LIMA-LAD graft was visualized by angiography. The graft exhibits no disease.  Ost 2nd Diag lesion is 100% stenosed.  SVG-Diag2 graft was visualized by angiography and is normal in caliber. The graft exhibits no disease --collateral branches fill 2nd Mrg into the distal Cx.  Prox RCA to Dist RCA lesion is 100% stenosed - known CTO.  NEW: Seq SVG- RPAV-dRCA graft was visualized by angiography and is large. Prox Graft to Mid Graft lesion before Acute Mrg is 100% stenosed.  LV end diastolic pressure is mildly elevated.  Newly found ostial occlusion of likely sequential SVG-AM-dRCA (proximal to stent from March 2017). This is in addition to known CTO of SVG-OM 2 Severe native disease with occluded mid RCA, mid circumflex and mid LAD as well as Diag2 Now 2 out of 4-5  grafts remain patent -SVG-Diag2 is patent with collaterals filling OM 2 and distal Circumflex, LIMA-LAD is patent.  Suspect mild troponin elevation was related to new onset A. fib RVR with ischemic cardiomyopathy and severe existing CAD.    Patient Profile     57 y.o. 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  Assessment & Plan    Persistent atrial flutter - Better rate control.  Consolidated diltiazem 30 every 6 to diltiazem CD 120 once a day. - Continue with anticoagulation.  Also on metoprolol and digoxin.  Left atrial appendage thrombus - On TEE was found to have thrombus.  After 4 weeks of anticoagulation recommend returning for TEE guided atrial flutter ablation.  Type II myocardial infarction - Catheter results reviewed.  This was a result of rapid ventricular response in the setting of underlying coronary artery disease, supply demand mismatch resulting in myocardial injury.  Medical therapy.  Also on aspirin and statin.  We will forego cardiac rehabilitation at this time.  Ischemic cardiomyopathy - On Toprol, Entresto.  Could be in part tachycardia mediated as well.  In 3 months after atrial flutter ablation repeat echocardiogram.  If less than 35 consider ICD.  Prior mitral valve repair with annuloplasty ring - Mild to moderate stenosis on echo with mild regurgitation.  Medical therapy.  Okay for discharge home.  We will have follow-up in approximately 2 weeks.  For questions or updates, please contact [facility] Please consult [email or web address] for contact info under Cardiology/STEMI.      Signed, Donato Schultz, MD

## 2017-12-13 NOTE — Progress Notes (Signed)
CARDIAC REHAB PHASE I   PRE:  Rate/Rhythm: 74 afib  BP:  Supine: 130/74  Sitting:   Standing:    SaO2: 94%RA  MODE:  Ambulation: 160 ft   POST:  Rate/Rhythm: 127 afib     83 after sitting on side of bed  BP:  Supine:   Sitting: 143/99  Standing:    SaO2: 99%RA 1036-1103 Pt's heart rate increased to 127 with short walk. First time pt had walked in hall. Stopped twice to rest due to SOB. Encouraged pursed lip breathing.   Sats good on RA upon return to room. Encouraged pt to only walk short distances at home and listening to body. Has been walking to bathroom here but had not walked in hall. Gave Off the Beat booklet and discussed atrial fib.   Luetta Nutting, RN BSN  12/13/2017 10:59 AM

## 2017-12-13 NOTE — Progress Notes (Signed)
Order received to discharge patient.  Telemetry monitor removed and CCMD notified.  PIV access removed.  Discharge instructions, follow up, medications and instructions for their use discussed with patient. 

## 2017-12-13 NOTE — Progress Notes (Signed)
Patient provided with 30 day free and copay reduction coupons for Entresto.

## 2017-12-13 NOTE — Progress Notes (Signed)
Progress Note  Patient Name: [name] Date of Encounter: 12/13/2017  Primary Cardiologist: Lesleigh Noe, MD   Subjective   Feels well, eager to go home.  Today is her anniversary.  No chest pain, no shortness of breath.  Medications consolidated.  Inpatient Medications    Scheduled Meds: . apixaban  5 mg Oral BID  . aspirin EC  81 mg Oral Daily  . atorvastatin  80 mg Oral Daily  . digoxin  0.25 mg Oral Daily  . diltiazem  120 mg Oral Daily  . feeding supplement (ENSURE ENLIVE)  237 mL Oral BID BM  . metoprolol succinate  100 mg Oral BID  . multivitamin with minerals  1 tablet Oral Daily  . sacubitril-valsartan  1 tablet Oral BID  . triamcinolone cream  1 application Topical QHS   Continuous Infusions: . sodium chloride     PRN Meds: sodium chloride, acetaminophen, nitroGLYCERIN, ondansetron (ZOFRAN) IV, sodium chloride flush   Vital Signs    Vitals:   12/12/17 1938 12/12/17 2326 12/13/17 0517 12/13/17 0845  BP: (!) 150/103 (!) 158/97 (!) 139/95 (!) 163/89  Pulse: (!) 129 95 (!) 124 90  Resp: (!) 21  Temp: 97.6 F (36.4 C) 97.9 F (36.6 C) 97.8 F (36.6 C) (!) 97.5 F (36.4 C)  TempSrc: Oral Oral Oral Oral  SpO2: 100% 100% 94% 99%  Weight:   162 lb 3.2 oz (73.6 kg)   Height:        Intake/Output Summary (Last 24 hours) at 12/13/2017 0958 Last data filed at 12/13/2017 0300 Gross per 24 hour  Intake 400 ml  Output -  Net 400 ml   Filed Weights   12/11/17 0442 12/12/17 0448 12/13/17 0517  Weight: 162 lb 4.8 oz (73.6 kg) 162 lb 1.6 oz (73.5 kg) 162 lb 3.2 oz (73.6 kg)    Telemetry    Atrial flutter, improved rate control less than 100- Personally Reviewed  ECG    No new EKG- Personally Reviewed  Physical Exam   GEN: No acute distress.   Neck: No JVD Cardiac: irreg irreg, no murmurs, rubs, or gallops.  Respiratory: Clear to auscultation bilaterally. GI: Soft, nontender, non-distended  MS: No edema; No deformity. Neuro:   Nonfocal  Psych: Normal affect   Labs    Chemistry Recent Labs  Lab 12/07/17 0310 12/11/17 0301 12/12/17 0254  NA 143 139 142  K 4.0 4.1 4.3  CL 106 105 108  CO2 GLUCOSE 105* 112* 107*  BUN CREATININE 1.03* 0.93 0.79  CALCIUM 9.3 9.0 9.3  GFRNONAA 58* >60 >60  GFRAA >60 >60 >60  ANIONGAP Hematology Recent Labs  Lab 12/09/17 0428 12/11/17 0301 12/12/17 0254  WBC 6.3 6.7 7.1  RBC 4.51 4.41 4.37  HGB 13.5 13.5 13.3  HCT 42.5 42.6 42.1  MCV 94.2 96.6 96.3  MCH 29.9 30.6 30.4  MCHC 31.8 31.7 31.6  RDW 15.8* 15.9* 15.7*  PLT 236 235 232    Cardiac EnzymesNo results for input(s): TROPONINI in the last 168 hours. No results for input(s): TROPIPOC in the last 168 hours.   BNPNo results for input(s): BNP, PROBNP in the last 168 hours.   DDimer No results for input(s): DDIMER in the last 168 hours.   Radiology    No results found.  Cardiac Studies   TEE on 12/07/2017 - EF 25 to 30% - Left  atrium with small thrombus -Mild mitral regurgitation post mitral valve repair.  Mild to moderate stenosis.   Cardiac catheterization 12/09/2017:  Prox Cx lesion is 70% stenosed. Ost 1st Mrg to 1st Mrg lesion is 65% stenosed.  Prox Cx to Mid Cx lesion is 100% stenosed - after 1st Mrg  SVG-2nd Mrg: Origin lesion is 100% stenosed. Known CTO.  Prox LAD lesion is 75% stenosed pre-2nd Diag. Prox LAD to Mid LAD lesion is 100% stenosed AFTER 2nd Diag & SP2 (SP1 & SP2 fill rPDA with faint collaterals)  LIMA-LAD graft was visualized by angiography. The graft exhibits no disease.  Ost 2nd Diag lesion is 100% stenosed.  SVG-Diag2 graft was visualized by angiography and is normal in caliber. The graft exhibits no disease --collateral branches fill 2nd Mrg into the distal Cx.  Prox RCA to Dist RCA lesion is 100% stenosed - known CTO.  NEW: Seq SVG- RPAV-dRCA graft was visualized by angiography and is large. Prox Graft to Mid Graft lesion before Acute  Mrg is 100% stenosed.  LV end diastolic pressure is mildly elevated.  Newly found ostial occlusion of likely sequential SVG-AM-dRCA (proximal to stent from March 2017). This is in addition to known CTO of SVG-OM 2 Severe native disease with occluded mid RCA, mid circumflex and mid LAD as well as Diag2 Now 2 out of 4-5 grafts remain patent -SVG-Diag2 is patent with collaterals filling OM 2 and distal Circumflex, LIMA-LAD is patent.  Suspect mild troponin elevation was related to new onset A. fib RVR with ischemic cardiomyopathy and severe existing CAD.    Patient Profile     57 y.o. 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  Assessment & Plan    Persistent atrial flutter - Better rate control.  Consolidated diltiazem 30 every 6 to diltiazem CD 120 once a day. - Continue with anticoagulation.  Also on metoprolol and digoxin.  Left atrial appendage thrombus - On TEE was found to have thrombus.  After 4 weeks of anticoagulation recommend returning for TEE guided atrial flutter ablation.  Type II myocardial infarction - Catheter results reviewed.  This was a result of rapid ventricular response in the setting of underlying coronary artery disease, supply demand mismatch resulting in myocardial injury.  Medical therapy.  Also on aspirin and statin.  We will forego cardiac rehabilitation at this time.  Ischemic cardiomyopathy - On Toprol, Entresto.  Could be in part tachycardia mediated as well.  In 3 months after atrial flutter ablation repeat echocardiogram.  If less than 35 consider ICD.  Prior mitral valve repair with annuloplasty ring - Mild to moderate stenosis on echo with mild regurgitation.  Medical therapy.  Okay for discharge home.  We will have follow-up in approximately 2  weeks.  For questions or updates, please contact [facility] Please consult [email or web address] for contact info under Cardiology/STEMI.      Signed, Donato Schultz, MD  12/13/2017, 9:58 AM

## 2017-12-13 NOTE — Discharge Instructions (Signed)
PLEASE REMEMBER TO BRING ALL OF YOUR MEDICATIONS TO EACH OF YOUR FOLLOW-UP OFFICE VISITS.  PLEASE ATTEND ALL SCHEDULED FOLLOW-UP APPOINTMENTS.   Activity: Increase activity slowly as tolerated. You may shower, but no soaking baths (or swimming) for 1 week. No driving for 24 hours. No lifting over 5 lbs for 1 week. No sexual activity for 1 week.   You May Return to Work: in 1 week (if applicable)  Wound Care: You may wash cath site gently with soap and water. Keep cath site clean and dry. If you notice pain, swelling, bleeding or pus at your cath site, please call [phone].  Maintain a low salt diet.       Information on my medicine - ELIQUIS (apixaban)  This medication education was reviewed with me or my healthcare representative as part of my discharge preparation.    Why was Eliquis prescribed for you? Eliquis was prescribed for you to reduce the risk of a blood clot forming that can cause a stroke if you have a medical condition called atrial fibrillation (a type of irregular heartbeat).  What do You need to know about Eliquis ? Take your Eliquis TWICE DAILY - one tablet in the morning and one tablet in the evening with or without food. If you have difficulty swallowing the tablet whole please discuss with your pharmacist how to take the medication safely.  Take Eliquis exactly as prescribed by your doctor and DO NOT stop taking Eliquis without talking to the doctor who prescribed the medication.  Stopping may increase your risk of developing a stroke.  Refill your prescription before you run out.  After discharge, you should have regular check-up appointments with your healthcare provider that is prescribing your Eliquis.  In the future your dose may need to be changed if your kidney function or weight changes by a significant amount or as you get older.  What do you do if you miss a dose? If you miss a dose, take it as soon as you remember on the same day and resume  taking twice daily.  Do not take more than one dose of ELIQUIS at the same time to make up a missed dose.  Important Safety Information A possible side effect of Eliquis is bleeding. You should call your healthcare provider right away if you experience any of the following: ? Bleeding from an injury or your nose that does not stop. ? Unusual colored urine (red or dark brown) or unusual colored stools (red or black). ? Unusual bruising for unknown reasons. ? A serious fall or if you hit your head (even if there is no bleeding).  Some medicines may interact with Eliquis and might increase your risk of bleeding or clotting while on Eliquis. To help avoid this, consult your healthcare provider or pharmacist prior to using any new prescription or non-prescription medications, including herbals, vitamins, non-steroidal anti-inflammatory drugs (NSAIDs) and supplements.  This website has more information on Eliquis (apixaban): http://www.eliquis.com/eliquis/home

## 2017-12-14 ENCOUNTER — Telehealth: Payer: Self-pay

## 2017-12-14 NOTE — Telephone Encounter (Signed)
**Note De-Identified [name] Obfuscation** Patient contacted regarding discharge from [facility] on 12/13/17.  Patient understands to follow up with provider Norma Fredrickson, NP on 12/20/17 at 10:00 at [address] [facility] [address] in [place]. Patient understands discharge instructions? Yes Patient understands medications and regiment? Yes Patient understands to bring all medications to this visit? Yes

## 2017-12-14 NOTE — Telephone Encounter (Signed)
We received a letter via fax from [employer] stating that the pts [name] has been approved for coverage. Approval good until 12/14/2018 File ID: [id number]

## 2017-12-14 NOTE — Telephone Encounter (Signed)
**Note De-identified [name] Obfuscation** -----  **Note De-Identified [name] Obfuscation** Message from Leonor Liv sent at 12/13/2017 11:06 AM EDT ----- Regarding: TOC  Patient has TOC appointment with Norma Fredrickson on 12/20/2017 at 10:00am

## 2017-12-14 NOTE — Telephone Encounter (Signed)
**Note De-identified [name] Obfuscation** -----  **Note De-identified [name] Obfuscation** Message from Avaletta L Williams sent at 12/13/2017 11:06 AM EDT ----- Regarding: TOC  Patient has TOC appointment with Lori Gerhardt on 12/20/2017 at 10:00am  

## 2017-12-14 NOTE — Telephone Encounter (Signed)
**Note De-identified Tahj Njoku Obfuscation** I left a message on the pts VM asking her to call me back. 

## 2017-12-16 ENCOUNTER — Emergency Department (HOSPITAL_COMMUNITY): Payer: 59

## 2017-12-16 ENCOUNTER — Telehealth (HOSPITAL_COMMUNITY): Payer: Self-pay

## 2017-12-16 ENCOUNTER — Encounter (HOSPITAL_COMMUNITY): Payer: Self-pay

## 2017-12-16 ENCOUNTER — Other Ambulatory Visit: Payer: Self-pay

## 2017-12-16 ENCOUNTER — Observation Stay (HOSPITAL_COMMUNITY)
Admission: EM | Admit: 2017-12-16 | Discharge: 2017-12-18 | DRG: 280 | Disposition: A | Discharge status: Home / Self Care | Payer: 59 | Attending: Family Medicine | Admitting: Family Medicine

## 2017-12-16 DIAGNOSIS — Z9114 Patient's other noncompliance with medication regimen: Secondary | ICD-10-CM

## 2017-12-16 DIAGNOSIS — I509 Heart failure, unspecified: Secondary | ICD-10-CM

## 2017-12-16 DIAGNOSIS — J9692 Respiratory failure, unspecified with hypercapnia: Secondary | ICD-10-CM | POA: Diagnosis present

## 2017-12-16 DIAGNOSIS — Z79899 Other long term (current) drug therapy: Secondary | ICD-10-CM

## 2017-12-16 DIAGNOSIS — I440 Atrioventricular block, first degree: Secondary | ICD-10-CM | POA: Diagnosis not present

## 2017-12-16 DIAGNOSIS — I222 Subsequent non-ST elevation (NSTEMI) myocardial infarction: Secondary | ICD-10-CM | POA: Diagnosis not present

## 2017-12-16 DIAGNOSIS — I5042 Chronic combined systolic (congestive) and diastolic (congestive) heart failure: Secondary | ICD-10-CM | POA: Diagnosis present

## 2017-12-16 DIAGNOSIS — R748 Abnormal levels of other serum enzymes: Secondary | ICD-10-CM | POA: Diagnosis present

## 2017-12-16 DIAGNOSIS — R7989 Other specified abnormal findings of blood chemistry: Secondary | ICD-10-CM | POA: Insufficient documentation

## 2017-12-16 DIAGNOSIS — Z7952 Long term (current) use of systemic steroids: Secondary | ICD-10-CM

## 2017-12-16 DIAGNOSIS — R7303 Prediabetes: Secondary | ICD-10-CM | POA: Diagnosis not present

## 2017-12-16 DIAGNOSIS — E669 Obesity, unspecified: Secondary | ICD-10-CM | POA: Diagnosis not present

## 2017-12-16 DIAGNOSIS — E785 Hyperlipidemia, unspecified: Secondary | ICD-10-CM | POA: Diagnosis not present

## 2017-12-16 DIAGNOSIS — Z955 Presence of coronary angioplasty implant and graft: Secondary | ICD-10-CM

## 2017-12-16 DIAGNOSIS — E7800 Pure hypercholesterolemia, unspecified: Secondary | ICD-10-CM | POA: Diagnosis present

## 2017-12-16 DIAGNOSIS — Z87891 Personal history of nicotine dependence: Secondary | ICD-10-CM | POA: Diagnosis not present

## 2017-12-16 DIAGNOSIS — I4892 Unspecified atrial flutter: Secondary | ICD-10-CM

## 2017-12-16 DIAGNOSIS — E872 Acidosis: Secondary | ICD-10-CM | POA: Diagnosis not present

## 2017-12-16 DIAGNOSIS — Z833 Family history of diabetes mellitus: Secondary | ICD-10-CM

## 2017-12-16 DIAGNOSIS — I480 Paroxysmal atrial fibrillation: Secondary | ICD-10-CM | POA: Diagnosis present

## 2017-12-16 DIAGNOSIS — I255 Ischemic cardiomyopathy: Secondary | ICD-10-CM | POA: Diagnosis not present

## 2017-12-16 DIAGNOSIS — Z8249 Family history of ischemic heart disease and other diseases of the circulatory system: Secondary | ICD-10-CM

## 2017-12-16 DIAGNOSIS — J9612 Chronic respiratory failure with hypercapnia: Secondary | ICD-10-CM

## 2017-12-16 DIAGNOSIS — E876 Hypokalemia: Secondary | ICD-10-CM | POA: Diagnosis present

## 2017-12-16 DIAGNOSIS — I420 Dilated cardiomyopathy: Secondary | ICD-10-CM | POA: Diagnosis present

## 2017-12-16 DIAGNOSIS — I160 Hypertensive urgency: Secondary | ICD-10-CM | POA: Diagnosis present

## 2017-12-16 DIAGNOSIS — I5023 Acute on chronic systolic (congestive) heart failure: Secondary | ICD-10-CM | POA: Diagnosis not present

## 2017-12-16 DIAGNOSIS — Z7901 Long term (current) use of anticoagulants: Secondary | ICD-10-CM

## 2017-12-16 DIAGNOSIS — Z823 Family history of stroke: Secondary | ICD-10-CM

## 2017-12-16 DIAGNOSIS — I110 Hypertensive heart disease with heart failure: Secondary | ICD-10-CM | POA: Diagnosis not present

## 2017-12-16 DIAGNOSIS — J9601 Acute respiratory failure with hypoxia: Secondary | ICD-10-CM | POA: Diagnosis not present

## 2017-12-16 DIAGNOSIS — R0603 Acute respiratory distress: Secondary | ICD-10-CM | POA: Diagnosis present

## 2017-12-16 DIAGNOSIS — Z952 Presence of prosthetic heart valve: Secondary | ICD-10-CM

## 2017-12-16 DIAGNOSIS — J9602 Acute respiratory failure with hypercapnia: Secondary | ICD-10-CM | POA: Diagnosis not present

## 2017-12-16 DIAGNOSIS — I248 Other forms of acute ischemic heart disease: Secondary | ICD-10-CM | POA: Diagnosis present

## 2017-12-16 DIAGNOSIS — Z90710 Acquired absence of both cervix and uterus: Secondary | ICD-10-CM

## 2017-12-16 DIAGNOSIS — I272 Pulmonary hypertension, unspecified: Secondary | ICD-10-CM | POA: Diagnosis not present

## 2017-12-16 DIAGNOSIS — I252 Old myocardial infarction: Secondary | ICD-10-CM | POA: Diagnosis not present

## 2017-12-16 DIAGNOSIS — I2510 Atherosclerotic heart disease of native coronary artery without angina pectoris: Secondary | ICD-10-CM | POA: Diagnosis present

## 2017-12-16 DIAGNOSIS — Z951 Presence of aortocoronary bypass graft: Secondary | ICD-10-CM

## 2017-12-16 DIAGNOSIS — R778 Other specified abnormalities of plasma proteins: Secondary | ICD-10-CM | POA: Insufficient documentation

## 2017-12-16 DIAGNOSIS — I10 Essential (primary) hypertension: Secondary | ICD-10-CM | POA: Diagnosis present

## 2017-12-16 LAB — I-STAT ARTERIAL BLOOD GAS, ED
ACID-BASE DEFICIT: 6 mmol/L — AB (ref 0.0–2.0)
BICARBONATE: 25.4 mmol/L (ref 20.0–28.0)
O2 Saturation: 99 %
PO2 ART: 219 mmHg — AB (ref 83.0–108.0)
TCO2: 28 mmol/L (ref 22–32)
pCO2 arterial: 76 mmHg (ref 32.0–48.0)
pH, Arterial: 7.128 — CL (ref 7.350–7.450)

## 2017-12-16 LAB — CBC WITH DIFFERENTIAL/PLATELET
ABS IMMATURE GRANULOCYTES: 0.1 10*3/uL (ref 0.0–0.1)
BASOS ABS: 0.1 10*3/uL (ref 0.0–0.1)
BASOS PCT: 1 %
EOS ABS: 0.1 10*3/uL (ref 0.0–0.7)
Eosinophils Relative: 1 %
HCT: 46.6 % — ABNORMAL HIGH (ref 36.0–46.0)
Hemoglobin: 14.1 g/dL (ref 12.0–15.0)
IMMATURE GRANULOCYTES: 1 %
Lymphocytes Relative: 44 %
Lymphs Abs: 3.7 10*3/uL (ref 0.7–4.0)
MCH: 29.9 pg (ref 26.0–34.0)
MCHC: 30.3 g/dL (ref 30.0–36.0)
MCV: 98.9 fL (ref 78.0–100.0)
Monocytes Absolute: 0.6 10*3/uL (ref 0.1–1.0)
Monocytes Relative: 7 %
NEUTROS ABS: 3.9 10*3/uL (ref 1.7–7.7)
NEUTROS PCT: 46 %
PLATELETS: 334 10*3/uL (ref 150–400)
RBC: 4.71 MIL/uL (ref 3.87–5.11)
RDW: 15.8 % — AB (ref 11.5–15.5)
WBC: 8.5 10*3/uL (ref 4.0–10.5)

## 2017-12-16 LAB — BASIC METABOLIC PANEL
ANION GAP: 16 — AB (ref 5–15)
BUN: 8 mg/dL (ref 6–20)
CALCIUM: 9.3 mg/dL (ref 8.9–10.3)
CO2: 24 mmol/L (ref 22–32)
Chloride: 101 mmol/L (ref 101–111)
Creatinine, Ser: 1.35 mg/dL — ABNORMAL HIGH (ref 0.44–1.00)
GFR calc non Af Amer: 42 mL/min — ABNORMAL LOW (ref >60)
GFR, EST AFRICAN AMERICAN: 49 mL/min — AB (ref >60)
Glucose, Bld: 276 mg/dL — ABNORMAL HIGH (ref 65–99)
Potassium: 3.8 mmol/L (ref 3.5–5.1)
SODIUM: 141 mmol/L (ref 135–145)

## 2017-12-16 LAB — I-STAT CHEM 8, ED
BUN: 10 mg/dL (ref 6–20)
CREATININE: 1.1 mg/dL — AB (ref 0.44–1.00)
Calcium, Ion: 1.22 mmol/L (ref 1.15–1.40)
Chloride: 107 mmol/L (ref 101–111)
GLUCOSE: 260 mg/dL — AB (ref 65–99)
HCT: 49 % — ABNORMAL HIGH (ref 36.0–46.0)
HEMOGLOBIN: 16.7 g/dL — AB (ref 12.0–15.0)
POTASSIUM: 3.9 mmol/L (ref 3.5–5.1)
Sodium: 145 mmol/L (ref 135–145)
TCO2: 26 mmol/L (ref 22–32)

## 2017-12-16 LAB — I-STAT TROPONIN, ED: TROPONIN I, POC: 0.26 ng/mL — AB (ref 0.00–0.08)

## 2017-12-16 LAB — BRAIN NATRIURETIC PEPTIDE: B NATRIURETIC PEPTIDE 5: 1457.4 pg/mL — AB (ref 0.0–100.0)

## 2017-12-16 LAB — CBG MONITORING, ED: Glucose-Capillary: 251 mg/dL — ABNORMAL HIGH (ref 65–99)

## 2017-12-16 LAB — MRSA PCR SCREENING: MRSA by PCR: NEGATIVE

## 2017-12-16 LAB — TROPONIN I: Troponin I: 0.35 ng/mL (ref <0.03)

## 2017-12-16 LAB — DIGOXIN LEVEL: Digoxin Level: 0.3 ng/mL — ABNORMAL LOW (ref 0.8–2.0)

## 2017-12-16 MED ORDER — DILTIAZEM HCL-DEXTROSE 100-5 MG/100ML-% IV SOLN (PREMIX)
5.0000 mg/h | INTRAVENOUS | Status: DC
  Administered 2017-12-16: 5 mg/h via INTRAVENOUS
  Filled 2017-12-16: qty 100

## 2017-12-16 MED ORDER — ALBUTEROL SULFATE (2.5 MG/3ML) 0.083% IN NEBU: 2.5000 mg | INHALATION_SOLUTION | RESPIRATORY_TRACT | Status: DC | PRN

## 2017-12-16 MED ORDER — FUROSEMIDE 10 MG/ML IJ SOLN
40.0000 mg | Freq: Once | INTRAMUSCULAR | Status: AC
  Administered 2017-12-16: 40 mg via INTRAVENOUS
  Filled 2017-12-16: qty 4

## 2017-12-16 MED ORDER — NITROGLYCERIN 0.4 MG SL SUBL: 0.4000 mg | SUBLINGUAL_TABLET | SUBLINGUAL | Status: DC | PRN

## 2017-12-16 MED ORDER — SACUBITRIL-VALSARTAN 49-51 MG PO TABS
1.0000 | ORAL_TABLET | Freq: Two times a day (BID) | ORAL | Status: DC
  Administered 2017-12-16 – 2017-12-18 (×4): 1 via ORAL
  Filled 2017-12-16 (×5): qty 1

## 2017-12-16 MED ORDER — METOPROLOL SUCCINATE ER 100 MG PO TB24
100.0000 mg | ORAL_TABLET | Freq: Two times a day (BID) | ORAL | Status: DC
  Administered 2017-12-16 – 2017-12-18 (×4): 100 mg via ORAL
  Filled 2017-12-16 (×4): qty 1

## 2017-12-16 MED ORDER — ASPIRIN EC 81 MG PO TBEC
81.0000 mg | DELAYED_RELEASE_TABLET | Freq: Every day | ORAL | Status: DC
  Administered 2017-12-17 – 2017-12-18 (×2): 81 mg via ORAL
  Filled 2017-12-16 (×2): qty 1

## 2017-12-16 MED ORDER — DILTIAZEM LOAD VIA INFUSION
20.0000 mg | Freq: Once | INTRAVENOUS | Status: AC
  Administered 2017-12-16: 20 mg via INTRAVENOUS
  Filled 2017-12-16: qty 20

## 2017-12-16 MED ORDER — INSULIN ASPART 100 UNIT/ML ~~LOC~~ SOLN: 0.0000 [IU] | Freq: Every day | SUBCUTANEOUS | Status: DC

## 2017-12-16 MED ORDER — ACETAMINOPHEN 650 MG RE SUPP: 650.0000 mg | Freq: Four times a day (QID) | RECTAL | Status: DC | PRN

## 2017-12-16 MED ORDER — ALBUTEROL SULFATE (2.5 MG/3ML) 0.083% IN NEBU: 5.0000 mg | INHALATION_SOLUTION | Freq: Once | RESPIRATORY_TRACT | Status: DC

## 2017-12-16 MED ORDER — ENSURE ENLIVE PO LIQD
237.0000 mL | Freq: Two times a day (BID) | ORAL | Status: DC
  Administered 2017-12-17 – 2017-12-18 (×2): 237 mL via ORAL

## 2017-12-16 MED ORDER — METOPROLOL TARTRATE 5 MG/5ML IV SOLN
2.5000 mg | INTRAVENOUS | Status: AC
  Filled 2017-12-16: qty 5

## 2017-12-16 MED ORDER — APIXABAN 5 MG PO TABS
5.0000 mg | ORAL_TABLET | Freq: Two times a day (BID) | ORAL | Status: DC
  Administered 2017-12-16 – 2017-12-18 (×4): 5 mg via ORAL
  Filled 2017-12-16 (×4): qty 1

## 2017-12-16 MED ORDER — IOPAMIDOL (ISOVUE-370) INJECTION 76%
100.0000 mL | Freq: Once | INTRAVENOUS | Status: AC | PRN
  Administered 2017-12-16: 100 mL via INTRAVENOUS

## 2017-12-16 MED ORDER — ALBUTEROL (5 MG/ML) CONTINUOUS INHALATION SOLN
10.0000 mg/h | INHALATION_SOLUTION | Freq: Once | RESPIRATORY_TRACT | Status: AC
  Administered 2017-12-16: 10 mg/h via RESPIRATORY_TRACT
  Filled 2017-12-16: qty 20

## 2017-12-16 MED ORDER — ACETAMINOPHEN 325 MG PO TABS: 650.0000 mg | ORAL_TABLET | Freq: Four times a day (QID) | ORAL | Status: DC | PRN

## 2017-12-16 MED ORDER — FUROSEMIDE 10 MG/ML IJ SOLN
40.0000 mg | Freq: Every day | INTRAMUSCULAR | Status: DC
  Administered 2017-12-17: 40 mg via INTRAVENOUS
  Filled 2017-12-16: qty 4

## 2017-12-16 MED ORDER — ATORVASTATIN CALCIUM 80 MG PO TABS
80.0000 mg | ORAL_TABLET | Freq: Every day | ORAL | Status: DC
  Administered 2017-12-16 – 2017-12-17 (×2): 80 mg via ORAL
  Filled 2017-12-16 (×2): qty 1

## 2017-12-16 MED ORDER — ADULT MULTIVITAMIN W/MINERALS CH
1.0000 | ORAL_TABLET | Freq: Every day | ORAL | Status: DC
  Administered 2017-12-17 – 2017-12-18 (×2): 1 via ORAL
  Filled 2017-12-16 (×2): qty 1

## 2017-12-16 MED ORDER — DIGOXIN 125 MCG PO TABS
0.2500 mg | ORAL_TABLET | Freq: Every day | ORAL | Status: DC
  Administered 2017-12-17 – 2017-12-18 (×2): 0.25 mg via ORAL
  Filled 2017-12-16 (×2): qty 2

## 2017-12-16 MED ORDER — INSULIN ASPART 100 UNIT/ML ~~LOC~~ SOLN
0.0000 [IU] | Freq: Three times a day (TID) | SUBCUTANEOUS | Status: DC
  Administered 2017-12-17 (×2): 1 [IU] via SUBCUTANEOUS

## 2017-12-16 MED ORDER — IOPAMIDOL (ISOVUE-370) INJECTION 76%
INTRAVENOUS | Status: AC
  Filled 2017-12-16: qty 100

## 2017-12-16 NOTE — Progress Notes (Signed)
Pt transported on Bipap to CT and returned to [facility]. Pt's vitals remained stable throughout the trip.

## 2017-12-16 NOTE — ED Notes (Signed)
No difficulty breathing at present.

## 2017-12-16 NOTE — Progress Notes (Signed)
ABG critical values given to J. Particia NearingHaviland, MD at [phone].

## 2017-12-16 NOTE — Progress Notes (Signed)
CRITICAL VALUE ALERT  Critical Value:  Troponin 0.35  Date & Time Notified:  12/16/2017 2040  Provider Notified: [facility]MT[facility] on-call  Orders Received/Actions taken: Awaiting orders

## 2017-12-16 NOTE — ED Notes (Signed)
Pt's CBG result was 251. Informed Clydie BraunKaren - RN.

## 2017-12-16 NOTE — Consult Note (Addendum)
Cardiology Consultation:   Patient ID: [name]; [id number]; 12-24-1960   Admit date: 12/16/2017 Date of Consult: 12/16/2017  Primary Care Provider: Patient, No Pcp Per Primary Cardiologist: Lesleigh Noe, MD  Primary Electrophysiologist:  none   Patient Profile:   [name] is a 57 y.o. female with a hx of atrial flutter, cardiomyopathy who is being seen today for the evaluation of hypercarbic respiratory failure at the request of Dr. Particia Nearing.  History of Present Illness:   [name] 57 year old female with ischemic cardiomyopathy, dilated cardiomyopathy EF 20 to 25% recently discharged in good favor from hospital on Tuesday scented to the emergency department with acute onset of shortness of breath, hypoxia, hypercarbia with initial O2 sats in the mid upper 80s on room air in the emergency department after being taken off of BiPAP because of pH of 7.1 and PCO2 of 70.  She is feeling better.  Diltiazem drip was started, atrial flutter has been present.  She has an left atrial appendage thrombus, could not be cardioverted previously during admission.  She reports that she smoked several years ago.  There is cardiomegaly on chest x-ray however lungs do appear to be hyperinflated with some flattening of her diaphragms.  She clearly has end expiratory wheezes on exam.  She was retaining CO2.  CT scan was negative for PE.  She denies any recent fevers chills nausea vomiting syncope bleeding.  Her onset of shortness of breath was fairly acute.  She states that she has had some wheezing in the past.  She tells me that she only had 2 of her medications at home, Eliquis and Entresto.  This makes me believe that she was not taking her Toprol or low-dose diltiazem.  Blood pressure highly elevated originally here in emergency department.  Currently improved.  She received albuterol nebulizer in conjunction with her BiPAP.  This dramatically improved her respiratory status.  She just  received a dose of IV Lasix.  Past Medical History:  Diagnosis Date  . Atrial fibrillation (HCC)   . Atrial flutter (HCC)   . CHF (congestive heart failure) (HCC)   . Coronary atherosclerosis of native coronary artery   . HTN (hypertension)   . Hx of CABG    LIMA-LAD, SVG-DIAG, SVG-OM, SVG-RCA  . Hypercholesteremia   . Myocardial infarct (HCC) 2007  . NSTEMI (non-ST elevated myocardial infarction) (HCC) 10/13/2015  . Obesity     Past Surgical History:  Procedure Laterality Date  . ABDOMINAL HYSTERECTOMY    . CARDIAC CATHETERIZATION N/A 10/14/2015   Procedure: Left Heart Cath and Cors/Grafts Angiography;  Surgeon: Marykay Lex, MD;  Location[facility] INVASIVE CV LAB;  Service: Cardiovascular;  Laterality: N/A;  . CARDIAC CATHETERIZATION N/A 10/14/2015   Procedure: Coronary Stent Intervention;  Surgeon: Marykay Lex, MD;  Location: [facility] INVASIVE CV LAB;  Service: Cardiovascular;  Laterality: N/A;  . CESAREAN SECTION  1985  . CORONARY ANGIOPLASTY WITH STENT PLACEMENT  2003   LAD Promus DES [name] 10/13/2015  . CORONARY ARTERY BYPASS GRAFT  03/2006   "CABG X3"  . LEFT HEART CATH AND CORS/GRAFTS ANGIOGRAPHY N/A 12/09/2017   Procedure: LEFT HEART CATH AND CORS/GRAFTS ANGIOGRAPHY;  Surgeon: Marykay Lex, MD;  Location: [facility] INVASIVE CV LAB;  Service: Cardiovascular;  Laterality: N/A;  . MITRAL VALVE REPAIR     [name] 10/13/2015  . TEE WITHOUT CARDIOVERSION N/A 12/07/2017   Procedure: TRANSESOPHAGEAL ECHOCARDIOGRAM (TEE);  Surgeon: Chrystie Nose, MD;  Location: [facility] ENDOSCOPY;  Service: Cardiovascular;  Laterality: N/A;     Home Medications:  Prior to Admission medications   Medication Sig Start Date End Date Taking? Authorizing Provider  apixaban (ELIQUIS) 5 MG TABS tablet Take 1 tablet (5 mg total) by mouth 2 (two) times daily. 12/13/17   Kroeger, Ovidio Kin., PA-C  aspirin EC 81 MG tablet Take 1 tablet (81 mg total) by mouth daily. 01/25/17   Lyn Records, MD  atorvastatin (LIPITOR) 80  MG tablet TAKE 1 TABLET (80 MG TOTAL) BY MOUTH DAILY AT 6 PM. Patient taking differently: Take 80 mg by mouth daily.  02/25/16   Barrett, Joline Salt, PA-C  digoxin (LANOXIN) 0.25 MG tablet Take 1 tablet (0.25 mg total) by mouth daily. 12/14/17   Kroeger, Ovidio Kin., PA-C  diltiazem (CARDIZEM CD) 120 MG 24 hr capsule Take 1 capsule (120 mg total) by mouth daily. 12/13/17   Kroeger, Ovidio Kin., PA-C  feeding supplement, ENSURE ENLIVE, (ENSURE ENLIVE) LIQD Take 237 mLs by mouth 2 (two) times daily between meals. 12/13/17   Kroeger, Ovidio Kin., PA-C  furosemide (LASIX) 40 MG tablet Take 1 tablet (40 mg total) by mouth daily. 10/20/17   Lyn Records, MD  metoprolol succinate (TOPROL-XL) 100 MG 24 hr tablet Take 1 tablet (100 mg total) by mouth 2 (two) times daily. Take with or immediately following a meal. 12/13/17   Kroeger, Ovidio Kin., PA-C  Multiple Vitamin (MULTIVITAMIN WITH MINERALS) TABS tablet Take 1 tablet by mouth daily.    [provider]  nitroGLYCERIN (NITROSTAT) 0.4 MG SL tablet Place 0.4 mg under the tongue every 5 (five) minutes as needed for chest pain (up to 3 doses before calling EMS).     [provider]  sacubitril-valsartan (ENTRESTO) 49-51 MG Take 1 tablet by mouth 2 (two) times daily. 12/13/17   Kroeger, Ovidio Kin., PA-C  triamcinolone cream (KENALOG) 0.1 % Apply 1 application topically at bedtime.    [provider]    Inpatient Medications: Scheduled Meds: . furosemide  40 mg Intravenous Once  . iopamidol       Continuous Infusions: . diltiazem (CARDIZEM) infusion 5 mg/hr (12/16/17 0811)   PRN Meds:   Allergies:    Allergies  Allergen Reactions  . Other Itching    GEL ON ELECTRODES FOR EKG    Social History:   Social History   Socioeconomic History  . Marital status: Married    Spouse name: Not on file  . Number of children: Not on file  . Years of education: Not on file  . Highest education level: Not on file  Occupational History  . Not on  file  Social Needs  . Financial resource strain: Not on file  . Food insecurity:    Worry: Not on file    Inability: Not on file  . Transportation needs:    Medical: Not on file    Non-medical: Not on file  Tobacco Use  . Smoking status: Former Smoker    Packs/day: 1.00    Years: 30.00    Pack years: 30.00    Types: Cigarettes  . Smokeless tobacco: Never Used  . Tobacco comment: "quit smoking cigarettes in ~ 2007"  Substance and Sexual Activity  . Alcohol use: Yes    Alcohol/week: 0.6 oz    Types: 1 Cans of beer per week    Comment: occ  . Drug use: No  . Sexual activity: Not Currently  Lifestyle  . Physical activity:    Days per week: Not on  file    Minutes per session: Not on file  . Stress: Not on file  Relationships  . Social connections:    Talks on phone: Not on file    Gets together: Not on file    Attends religious service: Not on file    Active member of club or organization: Not on file    Attends meetings of clubs or organizations: Not on file    Relationship status: Not on file  . Intimate partner violence:    Fear of current or ex partner: Not on file    Emotionally abused: Not on file    Physically abused: Not on file    Forced sexual activity: Not on file  Other Topics Concern  . Not on file  Social History Narrative  . Not on file    Family History:    Family History  Problem Relation Age of Onset  . Hypertension Father   . Stroke Sister   . Hypertension Sister   . Hypertension Brother   . Heart attack Neg Hx      ROS:  Please see the history of present illness.   All other ROS reviewed and negative.     Physical Exam/Data:   Vitals:   12/16/17 0915 12/16/17 0930 12/16/17 0945 12/16/17 0947  BP: (!) 133/92 134/81  126/77  Pulse:   (!) 108 (!) 109  Resp:  (!) 24 (!) 26 (!) 24  Temp:      TempSrc:      SpO2: 100%  95% 95%  Weight:      Height:       No intake or output data in the 24 hours ending 12/16/17 0950 Filed Weights    12/16/17 0738  Weight: 162 lb (73.5 kg)   Body mass index is 29.63 kg/m.  General:  Well nourished, well developed, currently resting fairly comfortably in bed, nasal cannula oxygen.  Previously on BiPAP to help with hypercarbic respiratory failure. HEENT: normal Lymph: no adenopathy Neck: no JVD Endocrine:  No thryomegaly Vascular: No carotid bruits; FA pulses 2+ bilaterally without bruits  Cardiac: Tachycardic, mildly irregular, atrial flutter, no significant murmurs Lungs: End expiratory wheezing heard bilaterally Abd: soft, nontender, no hepatomegaly  Ext: no edema Musculoskeletal:  No deformities, BUE and BLE strength normal and equal Skin: warm and dry  Neuro:  CNs 2-12 intact, no focal abnormalities noted Psych:  Normal affect   EKG:  The EKG was personally reviewed and demonstrates: Atrial flutter, heart rate in the 120s Telemetry:  Telemetry was personally reviewed and demonstrates: Atrial flutter heart rate in the 120s.  Relevant CV Studies:  Echocardiogram 12/05/2017 - Left ventricle: False tendon in mid LV cavity of no clinical   significance. The cavity size was mildly dilated. There was   moderate asymmetric hypertrophy. Systolic function was severely   reduced. The estimated ejection fraction was in the range of 25%   to 30%. Severe diffuse hypokinesis with distinct regional wall   motion abnormalities. There is akinesis of the inferolateral   myocardium. The study was not technically sufficient to allow   evaluation of LV diastolic dysfunction due to atrial   fibrillation. Doppler parameters are consistent with high   ventricular filling pressure. - Ventricular septum: The contour showed diastolic flattening and   systolic flattening. These changes are consistent with RV volume   and pressure overload. - Aortic valve: There was trivial regurgitation. - Mitral valve: Mobility of the posterior leaflet was restricted to  the point of immobility. The findings are  consistent with   moderate stenosis. There was mild regurgitation directed   eccentrically. Mean gradient (D): 9 mm Hg. Valve area by   continuity equation (using LVOT flow): 1.02 cm^2. - Left atrium: The atrium was severely dilated. - Right ventricle: The cavity size was mildly dilated. Wall   thickness was normal. Systolic function was moderately reduced. - Pulmonary arteries: PA peak pressure: 59 mm Hg (S).  Impressions:  - The right ventricular systolic pressure was increased consistent   with moderate pulmonary hypertension.  Cath 12/09/17:  Left Heart   Left Ventricle LV end diastolic pressure is mildly elevated. 12-13 mmHg  Coronary Diagrams   Diagnostic Diagram          Laboratory Data:  Chemistry Recent Labs  Lab 12/11/17 0301 12/12/17 0254 12/16/17 0742 12/16/17 0754  NA 139 142 141 145  K 4.1 4.3 3.8 3.9  CL 105 108 101 107  CO2 --   GLUCOSE 112* 107* 276* 260*  BUN CREATININE 0.93 0.79 1.35* 1.10*  CALCIUM 9.0 9.3 9.3  --   GFRNONAA >60 >60 42*  --   GFRAA >60 >60 49*  --   ANIONGAP 8 9 16*  --     No results for input(s): PROT, ALBUMIN, AST, ALT, ALKPHOS, BILITOT in the last 168 hours. Hematology Recent Labs  Lab 12/11/17 0301 12/12/17 0254 12/16/17 0742 12/16/17 0754  WBC 6.7 7.1 8.5  --   RBC 4.41 4.37 4.71  --   HGB 13.5 13.3 14.1 16.7*  HCT 42.6 42.1 46.6* 49.0*  MCV 96.6 96.3 98.9  --   MCH 30.6 30.4 29.9  --   MCHC 31.7 31.6 30.3  --   RDW 15.9* 15.7* 15.8*  --   PLT 235 232 334  --    Cardiac EnzymesNo results for input(s): TROPONINI in the last 168 hours.  Recent Labs  Lab 12/16/17 0751  TROPIPOC 0.26*    BNP Recent Labs  Lab 12/16/17 0742  BNP 1,457.4*    DDimer No results for input(s): DDIMER in the last 168 hours.  Radiology/Studies:  Ct Angio Chest Pe W And/or Wo Contrast  Result Date: 12/16/2017 CLINICAL DATA:  Respiratory distress EXAM: CT ANGIOGRAPHY CHEST WITH CONTRAST TECHNIQUE:  Multidetector CT imaging of the chest was performed using the standard protocol during bolus administration of intravenous contrast. Multiplanar CT image reconstructions and MIPs were obtained to evaluate the vascular anatomy. CONTRAST:  ISOVUE-370 IOPAMIDOL (ISOVUE-370) INJECTION 76% COMPARISON:  Chest x-ray today.  Chest CT 12/04/2017 FINDINGS: Cardiovascular: Diffuse coronary artery and aortic calcifications. Prior CABG and mitral valve replacement. No evidence of aortic aneurysm. Mild cardiomegaly. No filling defects in the pulmonary arteries to suggest pulmonary emboli. Mediastinum/Nodes: No mediastinal, hilar, or axillary adenopathy. Lungs/Pleura: Small bilateral pleural effusions. Bilateral lower lobe atelectasis or infiltrates, right greater than left. Cannot exclude pneumonia. Scattered ground-glass opacities throughout the lungs have increased since prior study and may reflect early edema. Upper Abdomen: Imaging into the upper abdomen shows no acute findings. Musculoskeletal: Chest wall soft tissues are unremarkable. No acute bony abnormality. Review of the MIP images confirms the above findings. IMPRESSION: No evidence of pulmonary embolus. Cardiomegaly.  Prior CABG. Increasing ground-glass opacities throughout the lungs, favor edema. Small bilateral effusions with bibasilar atelectasis or infiltrates. Electronically Signed   By: Charlett Nose M.D.   On: 12/16/2017 09:05   Dg Chest Portable 1 View  Result Date: 12/16/2017 CLINICAL DATA:  Respiratory distress EXAM: PORTABLE CHEST 1 VIEW COMPARISON:  Chest radiograph and chest CT Dec 04, 2017 FINDINGS: There is no edema or consolidation. There is generalized cardiomegaly, stable. The pulmonary vascularity is normal. Patient is status post coronary artery bypass grafting and mitral valve replacement. There is aortic atherosclerosis. No adenopathy. No bone lesions. IMPRESSION: Persistent generalized cardiac enlargement. No edema or consolidation.  Status post coronary artery bypass grafting and mitral valve replacement. There is aortic atherosclerosis. Aortic Atherosclerosis (ICD10-I70.0). Electronically Signed   By: Bretta Bang III M.D.   On: 12/16/2017 08:10    Assessment and Plan:   57 year old with hypercarbic respiratory failure, respiratory acidosis, chronic systolic heart failure, CAD post CABG in 2007, subsequent PCI to SVG in 2017, most recent cardiac catheterization last week resulting in medical management, normal left ventricular end-diastolic pressure,  atrial flutter with rapid ventricular response, trouble with medication adherence, anticoagulation chronic with Xarelto, left atrial appendage thrombus noted on 12/07/2017.   Acute hypercarbic respiratory failure with respiratory acidosis - Chest x-ray reveals what appear to be hyperinflated lungs with flattening of the hemidiaphragms.  She has a previous long-standing tobacco history, has not been smoking however recently.  On exam currently she is improved off of BiPAP after having albuterol nebulizer.  She still has some end expiratory wheezes.  Chest x-ray does not appear to have any significant pulmonary edema present. - Continue with nebulizers, may need long-term treatment for perhaps COPD in combination with her heart failure.  Per primary team.  Atrial flutter with rapid ventricular response - She states that she was only taking 2 of her medications at home, Eliquis and Entresto. - Restart Toprol XL 100 mg twice a day -Restart digoxin 0.25 mg once a day - Restart low-dose diltiazem 120 mg once a day.  We had trouble with rate control during previous hospitalization and required low-dose diltiazem.  Blood pressure allowed for this medication, not commonly used in the setting of cardiomyopathy. - After these medications were reintroduced, hopefully she will be able to come off of her diltiazem low-dose drip. -Cannot perform cardioversion because of recent left atrial  appendage thrombus.  Chronic systolic heart failure - It would not be unreasonable to continue with IV Lasix 40 mg twice a day.  Previously, her left ventricular end-diastolic pressure was fairly normal.  Her chest x-ray does not appear to have a significant amount of edema.  Careful monitoring of her creatinine. - Restart Entresto.  Coronary artery disease status post CABG 2007 with PCI of SVG in 2017 - Recent cardiac catheterization last week, medical management.  Secondary prevention.  Statin.  Aspirin.  Elevated troponin - Continues to be mildly elevated, flat, demand ischemia in the setting of underlying illness.  Hypertensive urgency -Blood pressure under much better control after respiratory status was improved.  Continue to reintroduce medications that she was on prior to discharge.  We will follow along.  For questions or updates, please contact [facility] Please consult [email or web address] for contact info under Cardiology/STEMI.   Signed, Donato Schultz, MD  12/16/2017 9:50 AM

## 2017-12-16 NOTE — ED Triage Notes (Signed)
Pt from home via EMS; pt experiencing SOB X 4 hours; hx asthma, CHF, afib; initial O2 sats 56% RA; NRB placed, pt at 98%; respirations labored, shallow, extremely diaphoretic

## 2017-12-16 NOTE — ED Notes (Addendum)
Pt's O2 sats observed to be in the mid-upper 80's on room air after being taken off bipap; pt placed on 4L O2 via [place] ; O2 sats now in the 90's; no distress noted at this time; will continue to monitor

## 2017-12-16 NOTE — ED Notes (Signed)
Pt placed on a bedpan.  

## 2017-12-16 NOTE — ED Provider Notes (Signed)
[facility] EMERGENCY DEPARTMENT Provider Note   CSN: [id number] Arrival date & time: 12/16/17  4098     History   Chief Complaint Chief Complaint  Patient presents with  . Respiratory Distress    HPI [name] is a 57 y.o. female.  Pt presents to the ED today with sob.  Pt is so sob that she is unable to give much hx.  Per EMS, her RA O2 sats were in the 50s.  She was put on a 100% NRB.  She normally does not wear oxygen.  The pt was admitted from 5/19 to 5/28.  She was admitted for new onset a.fib, chf, and nstemi.  While in the hospital, she had a TEE which showed a LA appendage thrombus, so direct cardioversion was not done.  She had a cath which showed progressive graft disease, but no stent that needed placement.  Elevation in troponin thought to be due to a. Flutter with rvr.  Pt was d/c home on eliquis, digoxin, diltiazem, and metoprolol.  The pt's EF was 25-30%.    CHA2DS2/VAS Stroke Risk Points  Current as of 12 minutes ago     4 >= 2 Points: High Risk  1 - 1.99 Points: Medium Risk  0 Points: Low Risk    This is the only CHA2DS2/VAS Stroke Risk Points available for the past  year.:  Last Change: N/A     Details    This score determines the patient's risk of having a stroke if the  patient has atrial fibrillation.       Points Metrics  1 Has Congestive Heart Failure:  Yes    Current as of 12 minutes ago  1 Has Vascular Disease:  Yes    Current as of 12 minutes ago  1 Has Hypertension:  Yes    Current as of 12 minutes ago  0 Age:  59    Current as of 12 minutes ago  0 Has Diabetes:  No    Current as of 12 minutes ago  0 Had Stroke:  No  Had TIA:  No  Had thromboembolism:  No    Current as of 12 minutes ago  1 Female:  Yes    Current as of 12 minutes ago               Past Medical History:  Diagnosis Date  . Atrial fibrillation (HCC)   . Atrial flutter (HCC)   . CHF (congestive heart failure) (HCC)   . Coronary atherosclerosis  of native coronary artery   . HTN (hypertension)   . Hx of CABG    LIMA-LAD, SVG-DIAG, SVG-OM, SVG-RCA  . Hypercholesteremia   . Myocardial infarct (HCC) 2007  . NSTEMI (non-ST elevated myocardial infarction) (HCC) 10/13/2015  . Obesity     Patient Active Problem List   Diagnosis Date Noted  . Respiratory failure with hypercapnia (HCC) 12/16/2017  . Atrial flutter (HCC) 12/16/2017  . Elevated troponin   . ACS (acute coronary syndrome) (HCC) 10/15/2015  . NSTEMI (non-ST elevated myocardial infarction) (HCC)   . Hypercholesteremia   . New onset atrial fibrillation (HCC)   . Chronic combined systolic and diastolic HF (heart failure), NYHA class 2 (HCC)   . Obesity   . Coronary artery disease involving coronary bypass graft of native heart with angina pectoris (HCC)   . HTN (hypertension)     Past Surgical History:  Procedure Laterality Date  . ABDOMINAL HYSTERECTOMY    . CARDIAC  CATHETERIZATION N/A 10/14/2015   Procedure: Left Heart Cath and Cors/Grafts Angiography;  Surgeon: Marykay Lex, MD;  Location: [facility] INVASIVE CV LAB;  Service: Cardiovascular;  Laterality: N/A;  . CARDIAC CATHETERIZATION N/A 10/14/2015   Procedure: Coronary Stent Intervention;  Surgeon: Marykay Lex, MD;  Location: [facility] INVASIVE CV LAB;  Service: Cardiovascular;  Laterality: N/A;  . CESAREAN SECTION  1985  . CORONARY ANGIOPLASTY WITH STENT PLACEMENT  2003   LAD Promus DES [name] 10/13/2015  . CORONARY ARTERY BYPASS GRAFT  03/2006   "CABG X3"  . LEFT HEART CATH AND CORS/GRAFTS ANGIOGRAPHY N/A 12/09/2017   Procedure: LEFT HEART CATH AND CORS/GRAFTS ANGIOGRAPHY;  Surgeon: Marykay Lex, MD;  Location: [facility] INVASIVE CV LAB;  Service: Cardiovascular;  Laterality: N/A;  . MITRAL VALVE REPAIR     [name] 10/13/2015  . TEE WITHOUT CARDIOVERSION N/A 12/07/2017   Procedure: TRANSESOPHAGEAL ECHOCARDIOGRAM (TEE);  Surgeon: Chrystie Nose, MD;  Location: [facility] ENDOSCOPY;  Service: Cardiovascular;  Laterality: N/A;      OB History   None      Home Medications    Prior to Admission medications   Medication Sig Start Date End Date Taking? Authorizing Provider  apixaban (ELIQUIS) 5 MG TABS tablet Take 1 tablet (5 mg total) by mouth 2 (two) times daily. 12/13/17   Kroeger, Ovidio Kin., PA-C  aspirin EC 81 MG tablet Take 1 tablet (81 mg total) by mouth daily. 01/25/17   Lyn Records, MD  atorvastatin (LIPITOR) 80 MG tablet TAKE 1 TABLET (80 MG TOTAL) BY MOUTH DAILY AT 6 PM. Patient taking differently: Take 80 mg by mouth daily.  02/25/16   Barrett, Joline Salt, PA-C  digoxin (LANOXIN) 0.25 MG tablet Take 1 tablet (0.25 mg total) by mouth daily. 12/14/17   Kroeger, Ovidio Kin., PA-C  diltiazem (CARDIZEM CD) 120 MG 24 hr capsule Take 1 capsule (120 mg total) by mouth daily. 12/13/17   Kroeger, Ovidio Kin., PA-C  feeding supplement, ENSURE ENLIVE, (ENSURE ENLIVE) LIQD Take 237 mLs by mouth 2 (two) times daily between meals. 12/13/17   Kroeger, Ovidio Kin., PA-C  furosemide (LASIX) 40 MG tablet Take 1 tablet (40 mg total) by mouth daily. 10/20/17   Lyn Records, MD  metoprolol succinate (TOPROL-XL) 100 MG 24 hr tablet Take 1 tablet (100 mg total) by mouth 2 (two) times daily. Take with or immediately following a meal. 12/13/17   Kroeger, Ovidio Kin., PA-C  Multiple Vitamin (MULTIVITAMIN WITH MINERALS) TABS tablet Take 1 tablet by mouth daily.    [provider]  nitroGLYCERIN (NITROSTAT) 0.4 MG SL tablet Place 0.4 mg under the tongue every 5 (five) minutes as needed for chest pain (up to 3 doses before calling EMS).     [provider]  sacubitril-valsartan (ENTRESTO) 49-51 MG Take 1 tablet by mouth 2 (two) times daily. 12/13/17   Kroeger, Ovidio Kin., PA-C  triamcinolone cream (KENALOG) 0.1 % Apply 1 application topically at bedtime.    [provider]    Family History Family History  Problem Relation Age of Onset  . Hypertension Father   . Stroke Sister   . Hypertension Sister   . Hypertension  Brother   . Heart attack Neg Hx     Social History Social History   Tobacco Use  . Smoking status: Former Smoker    Packs/day: 1.00    Years: 30.00    Pack years: 30.00    Types: Cigarettes  . Smokeless tobacco: Never Used  .  Tobacco comment: "quit smoking cigarettes in ~ 2007"  Substance Use Topics  . Alcohol use: Yes    Alcohol/week: 0.6 oz    Types: 1 Cans of beer per week    Comment: occ  . Drug use: No     Allergies   Other   Review of Systems Review of Systems  Unable to perform ROS: Severe respiratory distress  Respiratory: Positive for shortness of breath.   All other systems reviewed and are negative.    Physical Exam Updated Vital Signs BP 125/83   Pulse 87   Temp (!) 96.3 F (35.7 C) (Temporal)   Resp (!) 26   Ht  (1.575 m)   Wt 73.5 kg (162 lb)   SpO2 95%   BMI 29.63 kg/m   Physical Exam  Constitutional: She appears well-developed and well-nourished. She appears distressed.  HENT:  Head: Normocephalic and atraumatic.  Right Ear: External ear normal.  Left Ear: External ear normal.  Nose: Nose normal.  Mouth/Throat: Oropharynx is clear and moist.  Eyes: Pupils are equal, round, and reactive to light. Conjunctivae and EOM are normal.  Neck: Normal range of motion. Neck supple.  Cardiovascular: An irregularly irregular rhythm present. Tachycardia present.  Pulmonary/Chest: Accessory muscle usage present. Tachypnea noted. She is in respiratory distress.  Abdominal: Soft. Bowel sounds are normal.  Musculoskeletal: Normal range of motion.  Neurological:  Slow to respond.  She is moving all 4 extremities and is following simple commands.  Skin: Skin is warm. Capillary refill takes less than 2 seconds. She is diaphoretic.  Psychiatric:  Unable to assess due to severe resp distress.  Nursing note and vitals reviewed.    ED Treatments / Results  Labs (all labs ordered are listed, but only abnormal results are displayed) Labs Reviewed   BASIC METABOLIC PANEL - Abnormal; Notable for the following components:      Result Value   Glucose, Bld 276 (*)    Creatinine, Ser 1.35 (*)    GFR calc non Af Amer 42 (*)    GFR calc Af Amer 49 (*)    Anion gap 16 (*)    All other components within normal limits  CBC WITH DIFFERENTIAL/PLATELET - Abnormal; Notable for the following components:   HCT 46.6 (*)    RDW 15.8 (*)    All other components within normal limits  BRAIN NATRIURETIC PEPTIDE - Abnormal; Notable for the following components:   B Natriuretic Peptide 1,457.4 (*)    All other components within normal limits  DIGOXIN LEVEL - Abnormal; Notable for the following components:   Digoxin Level 0.3 (*)    All other components within normal limits  CBG MONITORING, ED - Abnormal; Notable for the following components:   Glucose-Capillary 251 (*)    All other components within normal limits  I-STAT TROPONIN, ED - Abnormal; Notable for the following components:   Troponin i, poc 0.26 (*)    All other components within normal limits  I-STAT ARTERIAL BLOOD GAS, ED - Abnormal; Notable for the following components:   pH, Arterial 7.128 (*)    pCO2 arterial 76.0 (*)    pO2, Arterial 219.0 (*)    Acid-base deficit 6.0 (*)    All other components within normal limits  I-STAT CHEM 8, ED - Abnormal; Notable for the following components:   Creatinine, Ser 1.10 (*)    Glucose, Bld 260 (*)    Hemoglobin 16.7 (*)    HCT 49.0 (*)    All  other components within normal limits  CBG MONITORING, ED    EKG EKG Interpretation  Date/Time:  Friday Dec 16 2017 07:33:43 EDT Ventricular Rate:  134 PR Interval:    QRS Duration: 92 QT Interval:  308 QTC Calculation: 460 R Axis:   0 Text Interpretation:  Ectopic atrial tachycardia, unifocal Inferior infarct, old Repol abnrm suggests ischemia, diffuse leads No significant change since last tracing Confirmed by Jacalyn Lefevre [phone]) on 12/16/2017 7:36:20 AM Also confirmed by Jacalyn Lefevre  [phone]), editor Sheppard Evens ([id number])  on 12/16/2017 9:04:19 AM   Radiology Ct Angio Chest Pe W And/or Wo Contrast  Result Date: 12/16/2017 CLINICAL DATA:  Respiratory distress EXAM: CT ANGIOGRAPHY CHEST WITH CONTRAST TECHNIQUE: Multidetector CT imaging of the chest was performed using the standard protocol during bolus administration of intravenous contrast. Multiplanar CT image reconstructions and MIPs were obtained to evaluate the vascular anatomy. CONTRAST:  ISOVUE-370 IOPAMIDOL (ISOVUE-370) INJECTION 76% COMPARISON:  Chest x-ray today.  Chest CT 12/04/2017 FINDINGS: Cardiovascular: Diffuse coronary artery and aortic calcifications. Prior CABG and mitral valve replacement. No evidence of aortic aneurysm. Mild cardiomegaly. No filling defects in the pulmonary arteries to suggest pulmonary emboli. Mediastinum/Nodes: No mediastinal, hilar, or axillary adenopathy. Lungs/Pleura: Small bilateral pleural effusions. Bilateral lower lobe atelectasis or infiltrates, right greater than left. Cannot exclude pneumonia. Scattered ground-glass opacities throughout the lungs have increased since prior study and may reflect early edema. Upper Abdomen: Imaging into the upper abdomen shows no acute findings. Musculoskeletal: Chest wall soft tissues are unremarkable. No acute bony abnormality. Review of the MIP images confirms the above findings. IMPRESSION: No evidence of pulmonary embolus. Cardiomegaly.  Prior CABG. Increasing ground-glass opacities throughout the lungs, favor edema. Small bilateral effusions with bibasilar atelectasis or infiltrates. Electronically Signed   By: Charlett Nose M.D.   On: 12/16/2017 09:05   Dg Chest Portable 1 View  Result Date: 12/16/2017 CLINICAL DATA:  Respiratory distress EXAM: PORTABLE CHEST 1 VIEW COMPARISON:  Chest radiograph and chest CT Dec 04, 2017 FINDINGS: There is no edema or consolidation. There is generalized cardiomegaly, stable. The pulmonary vascularity is normal.  Patient is status post coronary artery bypass grafting and mitral valve replacement. There is aortic atherosclerosis. No adenopathy. No bone lesions. IMPRESSION: Persistent generalized cardiac enlargement. No edema or consolidation. Status post coronary artery bypass grafting and mitral valve replacement. There is aortic atherosclerosis. Aortic Atherosclerosis (ICD10-I70.0). Electronically Signed   By: Bretta Bang III M.D.   On: 12/16/2017 08:10    Procedures Procedures (including critical care time)  Medications Ordered in ED Medications  diltiazem (CARDIZEM) 1 mg/mL load via infusion 20 mg (20 mg Intravenous Bolus from Bag 12/16/17 0810)    And  diltiazem (CARDIZEM) 100 mg in dextrose 5% (1 mg/mL) infusion (5 mg/hr Intravenous New Bag/Given 12/16/17 0811)  iopamidol (ISOVUE-370) 76 % injection (has no administration in time range)  albuterol (PROVENTIL,VENTOLIN) solution continuous neb (10 mg/hr Nebulization Given 12/16/17 0737)  iopamidol (ISOVUE-370) 76 % injection 100 mL (100 mLs Intravenous Contrast Given 12/16/17 0845)  furosemide (LASIX) injection 40 mg (40 mg Intravenous Given 12/16/17 0954)     Initial Impression / Assessment and Plan / ED Course  I have reviewed the triage vital signs and the nursing notes.  Pertinent labs & imaging results that were available during my care of the patient were reviewed by me and considered in my medical decision making (see chart for details).    CRITICAL CARE Performed by: Jacalyn Lefevre   Total critical  care time: 45 minutes  Critical care time was exclusive of separately billable procedures and treating other patients.  Critical care was necessary to treat or prevent imminent or life-threatening deterioration.  Critical care was time spent personally by me on the following activities: development of treatment plan with patient and/or surrogate as well as nursing, discussions with consultants, evaluation of patient's response to  treatment, examination of patient, obtaining history from patient or surrogate, ordering and performing treatments and interventions, ordering and review of laboratory studies, ordering and review of radiographic studies, pulse oximetry and re-evaluation of patient's condition.  Pt has much improved after bipap.  She is now awake and alert and talking.  She is no longer diaphoretic.  We will try to wean from bipap.  Pt's Hr is improved with cardizem drip, but it is still elevated.  Troponin is elevated, likely from rvr.  She said she was unable to get all her meds filled upon d/c.  She was supposed to get the rest today.  ? Which meds.  Pt d/w Dr. Anne Fu (cards) who saw pt.  He will follow cardiac issues, but recommends medicine admit.  Pt d/w FP resident for unassigned who will admit pt.  Final Clinical Impressions(s) / ED Diagnoses   Final diagnoses:  Acute on chronic congestive heart failure, unspecified heart failure type (HCC)  Acute respiratory failure with hypoxia and hypercapnia (HCC)  Atrial flutter with rapid ventricular response (HCC)  Elevated troponin    ED Discharge Orders    None       Jacalyn Lefevre, MD 12/16/17 1141

## 2017-12-16 NOTE — Telephone Encounter (Signed)
Patients insurance is active and benefits verified through [facility] - No co-pay, deductible amount of $3,300/$1,736.38 has been met, out of pocket amount of $6,600/$1,855.64 has been met, 20% co-insurance, and no pre-authorization is required. Passport/reference [id number]  Will contact patient to see if she is interested in the Cardiac Rehab Program. If interested patient will need to complete follow up appt and have Ablation. Patient will be contacted for scheduling once follow up appt after Ablation has been completed upon review by the RN Navigator.

## 2017-12-16 NOTE — H&P (Signed)
[facility] Admission History and Physical Service Pager: [phone]  Patient name: [name] Medical record number: [id number] Date of birth: 04-21-1961 Age: 57 y.o. Gender: female  Primary Care Provider: Patient, No Pcp Per Consultants: cardioloogy Code Status: full  Chief Complaint: SOB  Assessment and Plan: Avira Tillison is a 57 y.o. female presenting with respiratory distress . PMH is significant for triple CABG, NSTEMI, HFpEF 25-30%, CAD, prediabetes, HTN, obesity  Acute Shortness of Breath- likely due to afib with RVR as patient was not on prescribed diltiazem and metoprolol since discharge 5/28 due to cost. Differential also includes ACS vs fluid overload. Troponin elevated in ED however just had inpatient stay with extensive cardiac work up. CTA was negative for PE but did demonstrate small bilateral pleural effusions and was suspcious for edema. BNP elevated to 1459 which is double what the BNP was 2 weeks ago. Received IV lasix in ED w/ 1.1 L UOP so far. Dig level low in ED suggesting patient not taking Dig either. Last echo 2 weeks ago demonstrating combined systolic diastolic dysfunction with EF of 25-30%. Was satting 100 on 5L during admission exam but was reported at sats in 92s from EMS and required non-rebreather en route to hospital and Bipap in ED. ABG on arrival demonstrated hypoxia. Cardiology consulted in ED felt this was 2/2 undiagnosed COPD due to improvement with albuterol and wheeze on exam however no wheeze on our exam.  -admit to stepdown unit, attending Dr. Gwendolyn Grant -cardiology following, appreciate recs -continuous cardiac monitoring and pulse ox -supplemental O2 as needed to keep sats > 90% -will d/c dilt drip due to reduced EF until risk/benefit evaluation from cardiology given reduced EF -use IV metoprolol for afib -may need to discuss utility of amio drip w/ cards depending on how patient does off dilt -home Aspirin 81 -home  digoxin -home nitro PRN  -trend troponin, cardiology felt increase in trop due to demand ischemia -continue IV lasix to see if volume removal helps respiratory status -am EKG -vitals per floor -CM c/s for help affording medications -PT/OT eval/treat -ambulate pulse/ox -can keep on as needed albuterol but doubt this will help, monitor for wheezing  Prediates: (patient seems unaware).  5.9 A1c 12/05/17. Patient does report FH of DM. -SSI  -discussed will need outpatient followup might need medication control if diet cannot be changed significantly  HFpEF 25-30% from echo last week, unlikely to be changed at this point -consideration for event monitor per cards -IV furosemide -strict I/O -daily weights -home entresto  HTN- low to normal BP in ED, will continue home medications -home furosemide -home metoprolol -home entresto  Afib- as above-  Intermittently in RVR, was rate controlled on dilt drip but we are hoping to avoid this med due to reduced EF unless cardiology determine risk/benefit requires it -continue eliquis -home metoprolol -home entresto  CAD w/ hx of CABG in 2007, recent NSTEMI- trop elevated above baseline at 0.26 -trend troponins -am ECG   HLD- stable -continue statin  FEN/GI: heart/carb modified w/ fluid restriction Prophylaxis: on eliquis  Disposition: admit to step down unit for further management  History of Present Illness:  [name] is a 57 y.o. female presenting with shortness of breath on exertion (can't walk across house) that has been getting worse since her discharge from the hospital on Tuesday for new onset Afib w/ RVR.  She has hx significant for triple CABG, NSTEMI, HFpEF 25-30%, CAD, prediabetes, HTN, obesity.  When she left the hospital  she was prescribed a number of medications but only filled the eliquis, amd entresto.  She could not afford the other medications at the time and was planning on filling those today before she decided her  breathing was too weak.  She has only has a few non-productive coughs.  She has had no vomiting.  Not describing chest pain as much as discomfort with not breath, said it did not feel like an MI.  She does not endorse any new swelling in her legs.  No dizziness or changes in vision. No paralysis/parasthesia.  No urinary changes although she does endorse ~2 loose stools per day.  She does not smoke although she has a significent smoking hx.  No alcohol or illicit substances.   The meds she filled, she is confident she took on time as prescribed.  Review Of Systems: Per HPI with the following additions:   Review of Systems  Constitutional: Positive for malaise/fatigue. Negative for chills and fever.  HENT: Negative for congestion and sore throat.   Respiratory: Positive for cough and shortness of breath. Negative for hemoptysis, sputum production and wheezing.   Cardiovascular: Positive for palpitations. Negative for chest pain and leg swelling.  Gastrointestinal: Positive for constipation. Negative for abdominal pain, blood in stool, melena, nausea and vomiting.  Genitourinary: Negative for dysuria and urgency.  Musculoskeletal: Negative for falls and myalgias.  Neurological: Negative for dizziness, seizures and loss of consciousness.  Psychiatric/Behavioral: Negative for substance abuse.    Patient Active Problem List   Diagnosis Date Noted  . Respiratory failure with hypercapnia (HCC) 12/16/2017  . Atrial flutter (HCC) 12/16/2017  . Respiratory distress 12/16/2017  . Elevated troponin   . ACS (acute coronary syndrome) (HCC) 10/15/2015  . NSTEMI (non-ST elevated myocardial infarction) (HCC)   . Hypercholesteremia   . New onset atrial fibrillation (HCC)   . Chronic combined systolic and diastolic HF (heart failure), NYHA class 2 (HCC)   . Obesity   . Coronary artery disease involving coronary bypass graft of native heart with angina pectoris (HCC)   . HTN (hypertension)     Past  Medical History: Past Medical History:  Diagnosis Date  . Atrial fibrillation (HCC)   . Atrial flutter (HCC)   . CHF (congestive heart failure) (HCC)   . Coronary atherosclerosis of native coronary artery   . HTN (hypertension)   . Hx of CABG    LIMA-LAD, SVG-DIAG, SVG-OM, SVG-RCA  . Hypercholesteremia   . Myocardial infarct (HCC) 2007  . NSTEMI (non-ST elevated myocardial infarction) (HCC) 10/13/2015  . Obesity     Past Surgical History: Past Surgical History:  Procedure Laterality Date  . ABDOMINAL HYSTERECTOMY    . CARDIAC CATHETERIZATION N/A 10/14/2015   Procedure: Left Heart Cath and Cors/Grafts Angiography;  Surgeon: Marykay Lex, MD;  Location: [facility] INVASIVE CV LAB;  Service: Cardiovascular;  Laterality: N/A;  . CARDIAC CATHETERIZATION N/A 10/14/2015   Procedure: Coronary Stent Intervention;  Surgeon: Marykay Lex, MD;  Location: [facility] INVASIVE CV LAB;  Service: Cardiovascular;  Laterality: N/A;  . CESAREAN SECTION  1985  . CORONARY ANGIOPLASTY WITH STENT PLACEMENT  2003   LAD Promus DES [name] 10/13/2015  . CORONARY ARTERY BYPASS GRAFT  03/2006   "CABG X3"  . LEFT HEART CATH AND CORS/GRAFTS ANGIOGRAPHY N/A 12/09/2017   Procedure: LEFT HEART CATH AND CORS/GRAFTS ANGIOGRAPHY;  Surgeon: Marykay Lex, MD;  Location: [facility] INVASIVE CV LAB;  Service: Cardiovascular;  Laterality: N/A;  . MITRAL VALVE REPAIR     /  notes 10/13/2015  . TEE WITHOUT CARDIOVERSION N/A 12/07/2017   Procedure: TRANSESOPHAGEAL ECHOCARDIOGRAM (TEE);  Surgeon: Chrystie Nose, MD;  Location: [facility] ENDOSCOPY;  Service: Cardiovascular;  Laterality: N/A;    Social History: Social History   Tobacco Use  . Smoking status: Former Smoker    Packs/day: 1.00    Years: 30.00    Pack years: 30.00    Types: Cigarettes  . Smokeless tobacco: Never Used  . Tobacco comment: "quit smoking cigarettes in ~ 2007"  Substance Use Topics  . Alcohol use: Yes    Alcohol/week: 0.6 oz    Types: 1 Cans of beer per week     Comment: occ  . Drug use: No   Additional social history: lives with husband, former smoker, no alcohol or drug use Please also refer to relevant sections of EMR.  Family History: Family History  Problem Relation Age of Onset  . Hypertension Father   . Stroke Sister   . Hypertension Sister   . Hypertension Brother   . Heart attack Neg Hx    Siblings w/ irregular heartbeat  Allergies and Medications: Allergies  Allergen Reactions  . Other Itching    GEL ON ELECTRODES FOR EKG   No current facility-administered medications on file prior to encounter.    Current Outpatient Medications on File Prior to Encounter  Medication Sig Dispense Refill  . acetaminophen (TYLENOL) 500 MG tablet Take 1,000 mg by mouth as needed for mild pain.    [name] apixaban (ELIQUIS) 5 MG TABS tablet Take 1 tablet (5 mg total) by mouth 2 (two) times daily. 180 tablet 3  . atorvastatin (LIPITOR) 80 MG tablet TAKE 1 TABLET (80 MG TOTAL) BY MOUTH DAILY AT 6 PM. (Patient taking differently: Take 80 mg by mouth daily. ) 30 tablet 8  . furosemide (LASIX) 40 MG tablet Take 1 tablet (40 mg total) by mouth daily. 90 tablet 2  . metoprolol succinate (TOPROL-XL) 100 MG 24 hr tablet Take 1 tablet (100 mg total) by mouth 2 (two) times daily. Take with or immediately following a meal. 180 tablet 3  . Multiple Vitamin (MULTIVITAMIN WITH MINERALS) TABS tablet Take 1 tablet by mouth daily.    . nitroGLYCERIN (NITROSTAT) 0.4 MG SL tablet Place 0.4 mg under the tongue every 5 (five) minutes as needed for chest pain (up to 3 doses before calling EMS).     . sacubitril-valsartan (ENTRESTO) 49-51 MG Take 1 tablet by mouth 2 (two) times daily. 180 tablet 3  . triamcinolone cream (KENALOG) 0.1 % Apply 1 application topically at bedtime.    [name] aspirin EC 81 MG tablet Take 1 tablet (81 mg total) by mouth daily. (Patient not taking: Reported on 12/16/2017) 90 tablet 3  . digoxin (LANOXIN) 0.25 MG tablet Take 1 tablet (0.25 mg total) by mouth  daily. 90 tablet 3  . diltiazem (CARDIZEM CD) 120 MG 24 hr capsule Take 1 capsule (120 mg total) by mouth daily. 90 capsule 3  . feeding supplement, ENSURE ENLIVE, (ENSURE ENLIVE) LIQD Take 237 mLs by mouth 2 (two) times daily between meals. 237 mL 12    Objective: BP 100/61   Pulse 88   Temp (!) 96.3 F (35.7 C) (Temporal)   Resp 18   Ht  (1.575 m)   Wt 162 lb (73.5 kg)   SpO2 100%   BMI 29.63 kg/m  Exam: General: uncomfortable, non-toxic appearing, pleasant Eyes: clear sclera, EOMI ENTM: MMM but dry lips Neck: FROM, no LAD Cardiovascular:  irregular rhythm, intermittently tachy to ~110, 2/6 murmur, no pitting edema Respiratory: CTAB but with decreased air movement, unsure if effort was sufficient Gastrointestinal: no TTP, no masses, soft belly MSK: grossly moves limbs at will without deficit Derm: no lesions on exposed skin Neuro: CN without gross deficits Psych: AOx3, appropriate thought process, pleasant.  Labs and Imaging: CBC BMET  Recent Labs  Lab 12/16/17 0742 12/16/17 0754  WBC 8.5  --   HGB 14.1 16.7*  HCT 46.6* 49.0*  PLT 334  --    Recent Labs  Lab 12/16/17 0742 12/16/17 0754  NA 141 145  K 3.8 3.9  CL 101 107  CO2 24  --   BUN 8 10  CREATININE 1.35* 1.10*  GLUCOSE 276* 260*  CALCIUM 9.3  --      Ct Angio Chest Pe W And/or Wo Contrast  Result Date: 12/16/2017 CLINICAL DATA:  Respiratory distress EXAM: CT ANGIOGRAPHY CHEST WITH CONTRAST TECHNIQUE: Multidetector CT imaging of the chest was performed using the standard protocol during bolus administration of intravenous contrast. Multiplanar CT image reconstructions and MIPs were obtained to evaluate the vascular anatomy. CONTRAST:  ISOVUE-370 IOPAMIDOL (ISOVUE-370) INJECTION 76% COMPARISON:  Chest x-ray today.  Chest CT 12/04/2017 FINDINGS: Cardiovascular: Diffuse coronary artery and aortic calcifications. Prior CABG and mitral valve replacement. No evidence of aortic aneurysm. Mild  cardiomegaly. No filling defects in the pulmonary arteries to suggest pulmonary emboli. Mediastinum/Nodes: No mediastinal, hilar, or axillary adenopathy. Lungs/Pleura: Small bilateral pleural effusions. Bilateral lower lobe atelectasis or infiltrates, right greater than left. Cannot exclude pneumonia. Scattered ground-glass opacities throughout the lungs have increased since prior study and may reflect early edema. Upper Abdomen: Imaging into the upper abdomen shows no acute findings. Musculoskeletal: Chest wall soft tissues are unremarkable. No acute bony abnormality. Review of the MIP images confirms the above findings. IMPRESSION: No evidence of pulmonary embolus. Cardiomegaly.  Prior CABG. Increasing ground-glass opacities throughout the lungs, favor edema. Small bilateral effusions with bibasilar atelectasis or infiltrates. Electronically Signed   By: Charlett Nose M.D.   On: 12/16/2017 09:05   Dg Chest Portable 1 View  Result Date: 12/16/2017 CLINICAL DATA:  Respiratory distress EXAM: PORTABLE CHEST 1 VIEW COMPARISON:  Chest radiograph and chest CT Dec 04, 2017 FINDINGS: There is no edema or consolidation. There is generalized cardiomegaly, stable. The pulmonary vascularity is normal. Patient is status post coronary artery bypass grafting and mitral valve replacement. There is aortic atherosclerosis. No adenopathy. No bone lesions. IMPRESSION: Persistent generalized cardiac enlargement. No edema or consolidation. Status post coronary artery bypass grafting and mitral valve replacement. There is aortic atherosclerosis. Aortic Atherosclerosis (ICD10-I70.0). Electronically Signed   By: Bretta Bang III M.D.   On: 12/16/2017 08:10   Marthenia Rolling, DO PGY-1, FMTS  FPTS Upper-Level Resident Addendum  I have independently interviewed and examined the patient. I have discussed the above with the original author and agree with their documentation. My edits for correction/addition/clarification are in  pink.Please see also any attending notes.   Dolores Patty, DO PGY-2, [facility] FPTS Service pager: [phone] (text pages welcome through AMION)

## 2017-12-16 NOTE — Progress Notes (Signed)
Pt taken off Bipap per MD request. Pt tolerating well at this time.  RT will continue to monitor as needed. RN at bedside.

## 2017-12-16 NOTE — ED Notes (Signed)
purewick applied.

## 2017-12-16 NOTE — ED Notes (Signed)
Pt returned from CT °

## 2017-12-17 ENCOUNTER — Other Ambulatory Visit: Payer: Self-pay | Admitting: Family Medicine

## 2017-12-17 DIAGNOSIS — J9602 Acute respiratory failure with hypercapnia: Secondary | ICD-10-CM

## 2017-12-17 DIAGNOSIS — I4892 Unspecified atrial flutter: Secondary | ICD-10-CM | POA: Diagnosis not present

## 2017-12-17 DIAGNOSIS — I509 Heart failure, unspecified: Secondary | ICD-10-CM

## 2017-12-17 DIAGNOSIS — I10 Essential (primary) hypertension: Secondary | ICD-10-CM | POA: Diagnosis not present

## 2017-12-17 DIAGNOSIS — E7800 Pure hypercholesterolemia, unspecified: Secondary | ICD-10-CM

## 2017-12-17 DIAGNOSIS — I5023 Acute on chronic systolic (congestive) heart failure: Secondary | ICD-10-CM | POA: Diagnosis not present

## 2017-12-17 DIAGNOSIS — J9601 Acute respiratory failure with hypoxia: Secondary | ICD-10-CM | POA: Diagnosis not present

## 2017-12-17 DIAGNOSIS — I222 Subsequent non-ST elevation (NSTEMI) myocardial infarction: Secondary | ICD-10-CM | POA: Diagnosis not present

## 2017-12-17 DIAGNOSIS — I110 Hypertensive heart disease with heart failure: Secondary | ICD-10-CM | POA: Diagnosis not present

## 2017-12-17 LAB — BASIC METABOLIC PANEL
ANION GAP: 10 (ref 5–15)
BUN: 5 mg/dL — ABNORMAL LOW (ref 6–20)
CALCIUM: 9.2 mg/dL (ref 8.9–10.3)
CO2: 31 mmol/L (ref 22–32)
Chloride: 102 mmol/L (ref 101–111)
Creatinine, Ser: 0.96 mg/dL (ref 0.44–1.00)
GFR calc Af Amer: >60 mL/min (ref >60)
Glucose, Bld: 114 mg/dL — ABNORMAL HIGH (ref 65–99)
POTASSIUM: 3.2 mmol/L — AB (ref 3.5–5.1)
SODIUM: 143 mmol/L (ref 135–145)

## 2017-12-17 LAB — CBC
HEMATOCRIT: 42.4 % (ref 36.0–46.0)
HEMOGLOBIN: 13.4 g/dL (ref 12.0–15.0)
MCH: 30 pg (ref 26.0–34.0)
MCHC: 31.6 g/dL (ref 30.0–36.0)
MCV: 94.9 fL (ref 78.0–100.0)
Platelets: 258 10*3/uL (ref 150–400)
RBC: 4.47 MIL/uL (ref 3.87–5.11)
RDW: 15.8 % — AB (ref 11.5–15.5)
WBC: 6.3 10*3/uL (ref 4.0–10.5)

## 2017-12-17 LAB — TROPONIN I
TROPONIN I: 0.36 ng/mL — AB (ref <0.03)
Troponin I: 0.35 ng/mL (ref <0.03)

## 2017-12-17 LAB — GLUCOSE, CAPILLARY
GLUCOSE-CAPILLARY: 140 mg/dL — AB (ref 65–99)
GLUCOSE-CAPILLARY: 141 mg/dL — AB (ref 65–99)
GLUCOSE-CAPILLARY: 105 mg/dL — AB (ref 65–99)
Glucose-Capillary: 81 mg/dL (ref 65–99)

## 2017-12-17 MED ORDER — POTASSIUM CHLORIDE CRYS ER 20 MEQ PO TBCR
40.0000 meq | EXTENDED_RELEASE_TABLET | Freq: Two times a day (BID) | ORAL | Status: DC
  Administered 2017-12-17 – 2017-12-18 (×3): 40 meq via ORAL
  Filled 2017-12-17 (×3): qty 2

## 2017-12-17 MED ORDER — POTASSIUM CHLORIDE CRYS ER 20 MEQ PO TBCR: 40.0000 meq | EXTENDED_RELEASE_TABLET | Freq: Two times a day (BID) | ORAL | Status: DC

## 2017-12-17 MED ORDER — FUROSEMIDE 10 MG/ML IJ SOLN
40.0000 mg | Freq: Two times a day (BID) | INTRAMUSCULAR | Status: DC
  Administered 2017-12-17 – 2017-12-18 (×2): 40 mg via INTRAVENOUS
  Filled 2017-12-17 (×2): qty 4

## 2017-12-17 NOTE — Progress Notes (Signed)
RN was notified at 0315 by CCMD that pt had a 7 beat run of Vtach @ 0124. Pt asymptomatic in NAD, now lying awake in bed. Pt asked for graham crackers and water. RN notified FMTS on call provider of run of Vtach. Will continue to monitor. Caswell Corwinana C Bowman Higbie, RN 12/17/17 3:31 AM

## 2017-12-17 NOTE — Progress Notes (Signed)
Progress Note  Patient Name: [name] Date of Encounter: 12/17/2017  Primary Cardiologist: Lesleigh NoeHenry W Smith III, MD   Subjective   Feels better, less short of breath, heart rate under better control, no chest pain  Inpatient Medications    Scheduled Meds: . apixaban  5 mg Oral BID  . aspirin EC  81 mg Oral Daily  . atorvastatin  80 mg Oral Q2000  . digoxin  0.25 mg Oral Daily  . feeding supplement (ENSURE ENLIVE)  237 mL Oral BID BM  . furosemide  40 mg Intravenous Daily  . insulin aspart  0-5 Units Subcutaneous QHS  . insulin aspart  0-9 Units Subcutaneous TID WC  . metoprolol succinate  100 mg Oral BID  . multivitamin with minerals  1 tablet Oral Daily  . potassium chloride  40 mEq Oral BID  . sacubitril-valsartan  1 tablet Oral BID   Continuous Infusions:  PRN Meds: acetaminophen **OR** acetaminophen, albuterol, nitroGLYCERIN   Vital Signs    Vitals:   12/16/17 2324 12/17/17 0422 12/17/17 0808 12/17/17 1100  BP: 128/80 96/60 119/81 106/85  Pulse: 99 91 92 90  Resp: 16 16 18 20   Temp: 98.2 F (36.8 C) 98.2 F (36.8 C) 97.8 F (36.6 C) 97.9 F (36.6 C)  TempSrc: Oral Oral Oral Oral  SpO2: 95% 93% 98% 96%  Weight:  156 lb 1.4 oz (70.8 kg)    Height:        Intake/Output Summary (Last 24 hours) at 12/17/2017 1339 Last data filed at 12/16/2017 1531 Gross per 24 hour  Intake 180 ml  Output 850 ml  Net -670 ml   Filed Weights   12/16/17 0738 12/17/17 0422  Weight: 162 lb (73.5 kg) 156 lb 1.4 oz (70.8 kg)    Telemetry    Atrial fibrillation/flutter improved rate less than 100- Personally Reviewed  ECG    No new- Personally Reviewed  Physical Exam   GEN: No acute distress.   Neck: No JVD Cardiac: IRRR, no murmurs, rubs, or gallops.  Respiratory: mild crackles Bases bilaterally. GI: Soft, nontender, non-distended  MS: No edema; No deformity. Neuro:  Nonfocal  Psych: Normal affect   Labs    Chemistry Recent Labs  Lab 12/12/17 0254  12/16/17 0742 12/16/17 0754 12/17/17 0653  NA 142 141 145 143  K 4.3 3.8 3.9 3.2*  CL 108 101 107 102  CO2 25 24  --  31  GLUCOSE 107* 276* 260* 114*  BUN 12 8 10  5*  CREATININE 0.79 1.35* 1.10* 0.96  CALCIUM 9.3 9.3  --  9.2  GFRNONAA >60 42*  --  >60  GFRAA >60 49*  --  >60  ANIONGAP 9 16*  --  10     Hematology Recent Labs  Lab 12/12/17 0254 12/16/17 0742 12/16/17 0754 12/17/17 0653  WBC 7.1 8.5  --  6.3  RBC 4.37 4.71  --  4.47  HGB 13.3 14.1 16.7* 13.4  HCT 42.1 46.6* 49.0* 42.4  MCV 96.3 98.9  --  94.9  MCH 30.4 29.9  --  30.0  MCHC 31.6 30.3  --  31.6  RDW 15.7* 15.8*  --  15.8*  PLT 232 334  --  258    Cardiac Enzymes Recent Labs  Lab 12/16/17 1835 12/17/17 0032 12/17/17 0653  TROPONINI 0.35* 0.35* 0.36*    Recent Labs  Lab 12/16/17 0751  TROPIPOC 0.26*     BNP Recent Labs  Lab 12/16/17 0742  BNP 1,457.4*  DDimer No results for input(s): DDIMER in the last 168 hours.   Radiology    Ct Angio Chest Pe W And/or Wo Contrast  Result Date: 12/16/2017 CLINICAL DATA:  Respiratory distress EXAM: CT ANGIOGRAPHY CHEST WITH CONTRAST TECHNIQUE: Multidetector CT imaging of the chest was performed using the standard protocol during bolus administration of intravenous contrast. Multiplanar CT image reconstructions and MIPs were obtained to evaluate the vascular anatomy. CONTRAST:  ISOVUE-370 IOPAMIDOL (ISOVUE-370) INJECTION 76% COMPARISON:  Chest x-ray today.  Chest CT 12/04/2017 FINDINGS: Cardiovascular: Diffuse coronary artery and aortic calcifications. Prior CABG and mitral valve replacement. No evidence of aortic aneurysm. Mild cardiomegaly. No filling defects in the pulmonary arteries to suggest pulmonary emboli. Mediastinum/Nodes: No mediastinal, hilar, or axillary adenopathy. Lungs/Pleura: Small bilateral pleural effusions. Bilateral lower lobe atelectasis or infiltrates, right greater than left. Cannot exclude pneumonia. Scattered ground-glass  opacities throughout the lungs have increased since prior study and may reflect early edema. Upper Abdomen: Imaging into the upper abdomen shows no acute findings. Musculoskeletal: Chest wall soft tissues are unremarkable. No acute bony abnormality. Review of the MIP images confirms the above findings. IMPRESSION: No evidence of pulmonary embolus. Cardiomegaly.  Prior CABG. Increasing ground-glass opacities throughout the lungs, favor edema. Small bilateral effusions with bibasilar atelectasis or infiltrates. Electronically Signed   By: Charlett Nose M.D.   On: 12/16/2017 09:05   Dg Chest Portable 1 View  Result Date: 12/16/2017 CLINICAL DATA:  Respiratory distress EXAM: PORTABLE CHEST 1 VIEW COMPARISON:  Chest radiograph and chest CT Dec 04, 2017 FINDINGS: There is no edema or consolidation. There is generalized cardiomegaly, stable. The pulmonary vascularity is normal. Patient is status post coronary artery bypass grafting and mitral valve replacement. There is aortic atherosclerosis. No adenopathy. No bone lesions. IMPRESSION: Persistent generalized cardiac enlargement. No edema or consolidation. Status post coronary artery bypass grafting and mitral valve replacement. There is aortic atherosclerosis. Aortic Atherosclerosis (ICD10-I70.0). Electronically Signed   By: Bretta Bang III M.D.   On: 12/16/2017 08:10    Cardiac Studies   EF 25%  Patient Profile     57 y.o. female presented to the ER with pH of 7.1, PCO2 of 76 with hypoxia, hypercarbic respiratory failure, respiratory acidosis with rapid atrial fibrillation flutter, dilated cardiomyopathy.  Assessment & Plan    Hypercapnic respiratory failure - Improved.  Ischemic dilated cardiomyopathy -EF 20 to 25%, good diuresis 1.7 L out net, continue today. Increase to BID -Likely exacerbated by rapid atrial flutter.  She was not taking Toprol supposedly at home.  Creatinine stable  Atrial fibrillation/flutter rapid - Back on home  medications except for low-dose diltiazem CD 120.  Heart rate currently in the 90s.  Anticoagulation.  Troponin elevation -Demand ischemia in the setting of her rapid atrial fibrillation and cardiomyopathy.  Hypokalemia -Replete potassium  For questions or updates, please contact [facility] Please consult [email or web address] for contact info under Cardiology/STEMI.      Signed, Donato Schultz, MD  12/17/2017, 1:39 PM

## 2017-12-17 NOTE — Progress Notes (Signed)
EKG CRITICAL VALUE     12 lead EKG performed.  Critical value noted.  tracie nelson , RN notified.   Irish EldersCorazon R Kree Rafter, [place] 12/17/2017 8:24 AM

## 2017-12-17 NOTE — Care Management (Signed)
Spoke with patient regarding insurance drug coverage and inability to afford medications.  Pt has a high-deductible private plan through her job.  Pt states she is about to meet her deductible and her medications will then be covered at 100%.  Pt states she was on her way to fill the remainder of her prescriptions yesterday when she had to come to the hospital and she will be able to fill them when she leaves.  She also states she is in the process and has to plan to get a new PCP.  Pt is not interested in local clinics, but also not eligible for many assistance programs due to her private plan.

## 2017-12-17 NOTE — Progress Notes (Signed)
Patient asymptomatic when entering the room.  Patient's MD paged and troponin's were pending at the time.  Troponin 0.36.  And MD came to floor and let him know troponin labs are stopped and cardiac monitoring expiring.

## 2017-12-17 NOTE — Progress Notes (Signed)
Asked MD if he wanted to order another set of troponin levels. MD stated to wait until cardiology sees patient and go on from there. Cardiology saw patient and did not recommend another troponin set at this time.

## 2017-12-17 NOTE — Evaluation (Signed)
Occupational Therapy Evaluation Patient Details Name: [name] MRN: [id number] DOB: 1961/06/07 Today's Date: 12/17/2017    History of Present Illness  This 57 y.o. female admitted with respiratory distress.  Dx: A-Fib with RVR after non adherence to medications.  PMH includes:  NSTEMI, MI, h/o CABG, CAD, A-flutter, A-fib, CHF, sp MVR   Clinical Impression   Patient evaluated by Occupational Therapy with no further acute OT needs identified. All education has been completed and the patient has no further questions. Pt is mod I with ADLs.  HR 89.   See below for any follow-up Occupational Therapy or equipment needs. OT is signing off. Thank you for this referral.      Follow Up Recommendations  No OT follow up    Equipment Recommendations  None recommended by OT    Recommendations for Other Services       Precautions / Restrictions Precautions Precautions: None Restrictions Weight Bearing Restrictions: No      Mobility Bed Mobility Overal bed mobility: Independent                Transfers Overall transfer level: Independent Equipment used: None                  Balance Overall balance assessment: No apparent balance deficits (not formally assessed)                                         ADL either performed or assessed with clinical judgement   ADL Overall ADL's : Modified independent                                             Vision         Perception     Praxis      Pertinent Vitals/Pain Pain Assessment: No/denies pain     Hand Dominance Right   Extremity/Trunk Assessment Upper Extremity Assessment Upper Extremity Assessment: Overall WFL for tasks assessed    Lower Extremity Assessment Lower Extremity Assessment: Overall WFL for tasks assessed   Cervical / Trunk Assessment Cervical / Trunk Assessment: Normal   Communication Communication Communication: No difficulties   Cognition  Arousal/Alertness: Awake/alert Behavior During Therapy: WFL for tasks assessed/performed Overall Cognitive Status: Within Functional Limits for tasks assessed                                     General Comments  HR 89.  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  She drives, ambulates in grocery store without DOE.  No h/o falls  OT Problem List: Decreased activity tolerance      OT Treatment/Interventions:      OT Goals(Current goals can be found in the care plan section) Acute Rehab OT Goals Patient Stated Goal: return home soon OT Goal Formulation: All assessment and education complete, DC therapy  OT Frequency:     Barriers to D/C:            Co-evaluation              AM-PAC PT "6 Clicks" Daily Activity     Outcome Measure Help from another person eating meals?: None Help from another person taking care of personal grooming?: None Help from another person toileting, which includes using toliet, bedpan, or urinal?: None Help from another person bathing (including washing, rinsing, drying)?: None Help from another person to put on and taking off regular upper body clothing?: None Help from another person to put on and taking off regular lower body clothing?: None 6 Click Score: 24   End of Session Nurse Communication: Mobility status  Activity Tolerance: Patient tolerated  treatment well Patient left: in bed;with call bell/phone within reach  OT Visit Diagnosis: Unsteadiness on feet (R26.81)                Time: [id number] OT Time Calculation (min): 14 min Charges:  OT General Charges $OT Visit: 1 Visit OT Evaluation $OT Eval Low Complexity: 1 Low G-Codes:     Reynolds AmericanWendi Hala Narula, OTR/L [phone]   Jeani HawkingConarpe, Jamel Dunton M 12/17/2017, 5:13 PM

## 2017-12-17 NOTE — Evaluation (Signed)
Physical Therapy Evaluation Patient Details Name: Candice ShortJanet Lezotte MRN: [id number] DOB: 11/23/1960 Today's Date: 12/17/2017   History of Present Illness   This 57 y.o. female admitted with respiratory distress.  Dx: A-Fib with RVR after non adherence to medications.  PMH includes:  NSTEMI, MI, h/o CABG, CAD, A-flutter, A-fib, CHF, sp MVR    Clinical Impression  Pt presented sitting EOB, awake and willing to participate in therapy session. Prior to admission, pt reported that she was independent with all functional mobility and ADLs. Pt continues to work from home. Pt currently able to perform bed mobility independently, transfers independently and ambulated in hallway with supervision without use of an AD or UE supports. Of note, pt with HR starting out in the mid 110's and quickly increasing to high 130's with ambulation. Pt was asymptomatic throughout. Pt's RN was notified. PT discussed general energy conservation techniques with pt who expressed understanding. No further acute PT needs identified at this time. PT signing off.     Follow Up Recommendations No PT follow up    Equipment Recommendations  None recommended by PT    Recommendations for Other Services       Precautions / Restrictions Precautions Precautions: None Restrictions Weight Bearing Restrictions: No      Mobility  Bed Mobility Overal bed mobility: Independent                Transfers Overall transfer level: Independent Equipment used: None                Ambulation/Gait Ambulation/Gait assistance: Supervision Ambulation Distance (Feet): 200 Feet Assistive device: None Gait Pattern/deviations: Step-through pattern;Decreased stride length Gait velocity: decreased Gait velocity interpretation: 1.31 - 2.62 ft/sec, indicative of limited community ambulator General Gait Details: pt with no instability or LOB, no need for physical assistance or use of an AD; supervision for safety  Stairs             Wheelchair Mobility    Modified Rankin (Stroke Patients Only)       Balance Overall balance assessment: No apparent balance deficits (not formally assessed)                                           Pertinent Vitals/Pain Pain Assessment: No/denies pain    Home Living Family/patient expects to be discharged to:: Private residence Living Arrangements: Spouse/significant other Available Help at Discharge: Family;Available 24 hours/day Type of Home: House Home Access: Stairs to enter   [employer] CorporationEntrance Stairs-Number of Steps: 1 Home Layout: One level Home Equipment: None      Prior Function Level of Independence: Independent         Comments: 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  She drives, ambulates in grocery store without DOE.  No h/o falls      Hand Dominance   Dominant Hand: Right    Extremity/Trunk Assessment   Upper Extremity Assessment Upper Extremity Assessment: Defer to OT evaluation    Lower Extremity Assessment Lower Extremity Assessment: Overall WFL for tasks assessed    Cervical / Trunk Assessment Cervical / Trunk Assessment: Normal  Communication   Communication: No difficulties  Cognition Arousal/Alertness: Awake/alert Behavior During Therapy: WFL for tasks assessed/performed Overall Cognitive Status: Within Functional Limits for tasks assessed  General Comments General comments (skin integrity, edema, etc.): HR 89.  No DOE noted     Exercises     Assessment/Plan    PT Assessment Patent does not need any further PT services  PT Problem List         PT Treatment Interventions      PT Goals (Current goals can be found in the Care Plan section)  Acute Rehab PT Goals Patient Stated Goal: return home soon    Frequency     Barriers to discharge        Co-evaluation               AM-PAC PT "6 Clicks" Daily Activity  Outcome Measure  Difficulty turning over in bed (including adjusting bedclothes, sheets and blankets)?: None Difficulty moving from lying on back to sitting on the side of the bed? : None Difficulty sitting down on and standing up from a chair with arms (e.g., wheelchair, bedside commode, etc,.)?: None Help needed moving to and from a bed to chair (including a wheelchair)?: None Help needed walking in hospital room?: None Help needed climbing 3-5 steps with a railing? : None 6 Click Score: 24    End of Session   Activity Tolerance: Patient tolerated treatment well Patient left: in bed;with call bell/phone within reach;with family/visitor present Nurse Communication: Mobility status PT Visit Diagnosis: Other abnormalities of gait and mobility (R26.89)    Time: 0981-1914 PT Time Calculation (min) (ACUTE ONLY): 10 min   Charges:   PT Evaluation $PT Eval Low Complexity: 1 Low     PT G Codes:        [place], PT, DPT [phone]   Alessandra Bevels Tonnie Stillman 12/17/2017, 4:23 PM

## 2017-12-17 NOTE — Progress Notes (Signed)
Family Medicine Teaching Service Daily Progress Note Intern Pager: [phone]  Patient name: [name] Medical record number: [id number] Date of birth: Feb 26, 1961 Age: 57 y.o. Gender: female  Primary Care Provider: Patient, No Pcp Per Consultants: Cardiology Code Status: Full  Pt Overview and Major Events to Date:  Admitted on 5/31 Bipap on 5/31 >>[place]  Assessment and Plan: [name] is a 57 y.o. female presenting with respiratory distress . PMH is significant for triple CABG, NSTEMI, HFpEF 25-30%, CAD, prediabetes, HTN, obesity.  #Shortness of Breath, improved Presented with dyspnea in the setting of afib with RVR after non adherence to medications. Initially wheezing and acidotic (pH 7.128place on bipap with quick improvement with transition to [place] on 3L.continue to improve with albuterol prn. Currently on RA with O2 sat of 98%. SOB likely secondary to afib with RVR. Patient also had elevated BNP (last echo with reduced EF 25-30%) and small bilateral effusions and increased gournd glass opacity suggestive of edema which could also have contributed to her dyspnea. Lastly patient is a former tobacco user with CXR showing flattened diaphragm suspicious for COPD. CTA negative for PE, patient currently on anticoagulation. Good diuresis overnight (-1.9L) and weight down 6 lb (162>>156). -Cardiology following, appreciate recs -continuous cardiac monitoring and pulse ox -supplemental O2 as needed to keep sats > 90% -will d/c dilt drip due to reduced EF until risk/benefit evaluation from cardiology given reduced EF --Continue metoprolol succinate 100 mg bid po -home Aspirin 81 --Continue digoxin 0.25 mg --Continue Nitroglycerin 0.4 mg  PRN  --Continue IV lasix 40 mg  --am EKG --vitals per floor -CM c/s for help affording medications -PT/OT eval/treat --Albuterol prn, consider Xopenex given initial tachycardia    #Afib with RVR, improved  Rate is controlled this morning at 90. EKG  consistent with aflutter. LAA thrombus seen on TEE from 5/22. --Follow up on cardiology consult, appreciate recs --Continue Eliquis 5 mg bid  --Continue metoprolol succinate 100 mg bid po --Continue digoxin 0.25 mg   #HFpEF Last echo on 5/20 showed an EF 25-30% with severe diffused hypokinesis with distinct regional wall motion abnormalities. Elevated BNP on admission at 1457.4 with bilateral pleural effusion and increase ground glass opacities consistent with edema. pateint also had crackles on exam bilaterally. Diuresed overnight with good output. Weight on admission did not change from last hospital discharge 162 lb. This am down to 156 lb. Still mild crackles appreciated on exam. -Follow up on Cardiology consult, appreciate recs. -continue with 2nd dose IV furosemide 40 mg, will likely transition to po -strict I/O -daily weights -Continue Entresto 49 mg -51mg  bid  #Prediabetes 5.9 A1c 12/05/17. Patient does report FH of DM. -SSI  -discussed will need outpatient followup might need medication control if diet cannot be changed significantly  #HTN, improved This morningwill continue home medications -home furosemide -home metoprolol -home entresto  #CAD w/ hx of CABG in 2007, recent NSTEM Trop elevated at 0.26 on admission. They continued to be elevated overnight 0.35>0.35>0.36but have reach a plateau. Cardiology initial  Thought was that increased was 2/2 demand ischemia. No chest pain reported and patient has improved clinically. -Follow up on Cardiology consult, appreciate recs. --Continue ASA -am ECG    #HLD, stable --Continue atorvastatin 80 mg daily   FEN/GI: heart and card modified PPx: on eliquis   Disposition: Home pending clinical improvement  Subjective:  Patient feeling better this morning. Breathing has improved and she is no longer short of breath. Taking good po. No chest pain, abdominal pain reported.  Objective: Temp:  [97.8 F (36.6 C)-98.5 F  (36.9 C)] 97.8 F (36.6 C) (06/01 0808) Pulse Rate:  [40-131] 92 (06/01 0808) Resp:  [16-25] 18 (06/01 0808) BP: (96-142)/(56-97) 119/81 (06/01 0808) SpO2:  [92 %-100 %] 98 % (06/01 0808) Weight:  [156 lb 1.4 oz (70.8 kg)] 156 lb 1.4 oz (70.8 kg) (06/01 0422)   Physical Exam: General: in NAD, pleasant, able to participate in exam Eyes: clear sclera, EOMI ENTM: MMM but dry lips Neck: FROM, no LAD Cardiovascular: irregular rhythm, intermittently, 2/6 murmur, no pitting edema Respiratory: Bilateral crackles at the bases, no wheezing noted. Gastrointestinal: no TTP, no masses, soft belly MSK: grossly moves limbs at will without deficit Derm: no lesions on exposed skin Neuro: CN without gross deficits Psych: AOx3, appropriate thought process, pleasant.   Laboratory: Recent Labs  Lab 12/12/17 0254 12/16/17 0742 12/16/17 0754 12/17/17 0653  WBC 7.1 8.5  --  6.3  HGB 13.3 14.1 16.7* 13.4  HCT 42.1 46.6* 49.0* 42.4  PLT 232 334  --  258   Recent Labs  Lab 12/12/17 0254 12/16/17 0742 12/16/17 0754 12/17/17 0653  NA 142 141 145 143  K 4.3 3.8 3.9 3.2*  CL 108 101 107 102  CO2 25 24  --  31  BUN 12 8 10  5*  CREATININE 0.79 1.35* 1.10* 0.96  CALCIUM 9.3 9.3  --  9.2  GLUCOSE 107* 276* 260* 114*   Trop: 0.35>0.35>0.36  Imaging/Diagnostic Tests:  Ct Angio Chest Pe W And/or Wo Contrast IMPRESSION: No evidence of pulmonary embolus. Cardiomegaly.  Prior CABG. Increasing ground-glass opacities throughout the lungs, favor edema. Small bilateral effusions with bibasilar atelectasis or infiltrates. Electronically Signed   By: Charlett NoseKevin  Dover M.D.   On: 12/16/2017 09:05   Dg Chest Portable 1 View IMPRESSION: Persistent generalized cardiac enlargement. No edema or consolidation. Status post coronary artery bypass grafting and mitral valve replacement. There is aortic atherosclerosis. Aortic Atherosclerosis (ICD10-I70.0). Electronically Signed   By: Bretta BangWilliam  Woodruff III M.D.   On:  12/16/2017 08:10    Lovena Neighboursiallo, Vasily Fedewa, MD 12/17/2017, 11:50 AM PGY-2, [facility] FPTS Intern pager: [phone], text pages welcome

## 2017-12-18 DIAGNOSIS — I4892 Unspecified atrial flutter: Secondary | ICD-10-CM | POA: Diagnosis not present

## 2017-12-18 DIAGNOSIS — J9602 Acute respiratory failure with hypercapnia: Secondary | ICD-10-CM | POA: Diagnosis not present

## 2017-12-18 DIAGNOSIS — I10 Essential (primary) hypertension: Secondary | ICD-10-CM | POA: Diagnosis not present

## 2017-12-18 DIAGNOSIS — I5023 Acute on chronic systolic (congestive) heart failure: Secondary | ICD-10-CM | POA: Diagnosis not present

## 2017-12-18 LAB — CBC
HCT: 44.5 % (ref 36.0–46.0)
Hemoglobin: 14.1 g/dL (ref 12.0–15.0)
MCH: 30.5 pg (ref 26.0–34.0)
MCHC: 31.7 g/dL (ref 30.0–36.0)
MCV: 96.3 fL (ref 78.0–100.0)
PLATELETS: 269 10*3/uL (ref 150–400)
RBC: 4.62 MIL/uL (ref 3.87–5.11)
RDW: 15.9 % — ABNORMAL HIGH (ref 11.5–15.5)
WBC: 5 10*3/uL (ref 4.0–10.5)

## 2017-12-18 LAB — BASIC METABOLIC PANEL
Anion gap: 11 (ref 5–15)
BUN: 13 mg/dL (ref 6–20)
CALCIUM: 9.8 mg/dL (ref 8.9–10.3)
CHLORIDE: 103 mmol/L (ref 101–111)
CO2: 30 mmol/L (ref 22–32)
Creatinine, Ser: 1.08 mg/dL — ABNORMAL HIGH (ref 0.44–1.00)
GFR calc non Af Amer: 55 mL/min — ABNORMAL LOW (ref >60)
GLUCOSE: 95 mg/dL (ref 65–99)
Potassium: 4.4 mmol/L (ref 3.5–5.1)
Sodium: 144 mmol/L (ref 135–145)

## 2017-12-18 LAB — GLUCOSE, CAPILLARY
GLUCOSE-CAPILLARY: 111 mg/dL — AB (ref 65–99)
Glucose-Capillary: 101 mg/dL — ABNORMAL HIGH (ref 65–99)

## 2017-12-18 NOTE — Progress Notes (Signed)
Family Medicine Teaching Service Daily Progress Note Intern Pager: [phone]  Patient name: [name] Medical record number: [id number] Date of birth: [birth year] Age: 57 y.o. Gender: female  Primary Care Provider: Patient, No Pcp Per Consultants: Cardiology Code Status: Full  Pt Overview and Major Events to Date:  Admitted on 5/31 Bipap on 5/31 >>[place]  Assessment and Plan: Candice ShortJanet Debski is a 57 y.o. female presenting with respiratory distress . PMH is significant for triple CABG, NSTEMI, HFpEF 25-30%, CAD, prediabetes, HTN, obesity.  #Shortness of Breath, improved Presented with dyspnea in the setting of afib with RVR after non adherence to medications. Continue to diurese well though output not recorded. Currently on RA with O2 sat of 99%.  Good diuresis overnight (-1.9L) and weight stable at 156. Has no required any breathing treatment. --Cardiology following, appreciate recs --continuous cardiac monitoring and pulse ox --Supplemental O2 as needed to keep sats > 90% --Continue metoprolol succinate 100 mg bid po --home Aspirin 81 --Continue digoxin 0.25 mg --Continue Nitroglycerin 0.4 mg  PRN  --Continue IV lasix 40 mg bid could transition to po dose today --am EKG --vitals per floor -CM c/s for help affording medications -PT/OT eval/treat --Albuterol prn, consider Xopenex given initial tachycardia    #Afib with RVR, improved  Rate is controlled this morning at 90. EKG consistent with aflutter. LAA thrombus seen on TEE from 5/22. --Follow up on cardiology consult, appreciate recs --Continue Eliquis 5 mg bid  --Continue metoprolol succinate 100 mg bid po --Continue digoxin 0.25 mg  #HFpEF Last echo on 5/20 showed an EF 25-30% with severe diffused hypokinesis with distinct regional wall motion abnormalities. Improved crackles with continued diuresis. Could likely transition to po lasix this am. --Follow up on Cardiology consult, appreciate recs. --Continue IV furosemide 40 mg,  will likely transition to po --Strict I/O --Daily weights --Continue Entresto 49 mg -51mg  bid   #Prediabetes 5.9 A1c 12/05/17. Patient does report FH of DM. Well controlled during stay. -SSI  -discussed will need outpatient followup might need medication control if diet cannot be changed significantly  #HTN, improved This morning will continue home medications -Continue furosemide as above could transition to home dose -home metoprolol -home entresto  #CAD w/ hx of CABG in 2007, recent NSTEMI Trop elevated at 0.26 on admission. Plateau at 0.36 2/2 demand ischemia. Patient denies any chest pain and well appearing --Follow up on Cardiology consult, appreciate recs. --Continue ASA -am ECG    #HLD, stable --Continue atorvastatin 80 mg daily   FEN/GI: heart and card modified PPx: on eliquis   Disposition: Home pending clinical improvement  Subjective:  Patient feeling better this morning. Breathing has improved and she is no longer short of breath. Taking good po. No chest pain, abdominal pain reported.   Objective: Temp:  [97.6 F (36.4 C)-98.4 F (36.9 C)] 98.4 F (36.9 C) (06/02 0734) Pulse Rate:  [80-103] 80 (06/02 0734) Resp:  [12-24] 12 (06/02 0734) BP: (106-121)/(71-85) 108/71 (06/02 0734) SpO2:  [96 %-100 %] 99 % (06/02 0734) Weight:  [156 lb 1.4 oz (70.8 kg)] 156 lb 1.4 oz (70.8 kg) (06/02 0351)   Physical Exam: General: in NAD, pleasant, able to participate in exam Eyes: clear sclera, EOMI ENTM: MMM but dry lips Neck: FROM, no LAD Cardiovascular: irregular rhythm, intermittently, 2/6 murmur, no pitting edema Respiratory: Minimal crackles mostly CTAB, no wheezing noted. Gastrointestinal: no TTP, no masses, soft belly MSK: grossly moves limbs at will without deficit Derm: no lesions on exposed skin Neuro: CN without  gross deficits Psych: AOx3, appropriate thought process, pleasant.   Laboratory: Recent Labs  Lab 12/16/17 0742 12/16/17 0754  12/17/17 0653 12/18/17 0349  WBC 8.5  --  6.3 5.0  HGB 14.1 16.7* 13.4 14.1  HCT 46.6* 49.0* 42.4 44.5  PLT 334  --  258 269   Recent Labs  Lab 12/16/17 0742 12/16/17 0754 12/17/17 0653 12/18/17 0349  NA 141 145 143 144  K 3.8 3.9 3.2* 4.4  CL 101 107 102 103  CO2 24  --  31 30  BUN 8 10 5* 13  CREATININE 1.35* 1.10* 0.96 1.08*  CALCIUM 9.3  --  9.2 9.8  GLUCOSE 276* 260* 114* 95   Trop: 0.35>0.35>0.36  Imaging/Diagnostic Tests:  Ct Angio Chest Pe W And/or Wo Contrast IMPRESSION: No evidence of pulmonary embolus. Cardiomegaly.  Prior CABG. Increasing ground-glass opacities throughout the lungs, favor edema. Small bilateral effusions with bibasilar atelectasis or infiltrates. Electronically Signed   By: Charlett Nose M.D.   On: 12/16/2017 09:05   Dg Chest Portable 1 View IMPRESSION: Persistent generalized cardiac enlargement. No edema or consolidation. Status post coronary artery bypass grafting and mitral valve replacement. There is aortic atherosclerosis. Aortic Atherosclerosis (ICD10-I70.0). Electronically Signed   By: Bretta Bang III M.D.   On: 12/16/2017 08:10    Lovena Neighbours, MD 12/18/2017, 10:03 AM PGY-2, [facility] FPTS Intern pager: [phone], text pages welcome

## 2017-12-18 NOTE — Care Management Note (Signed)
Case Management Note  Patient Details  Name: Gretchen ShortJanet Somera MRN: [id number] Date of Birth: Mar 08, 1961  Subjective/Objective:                    Action/Plan: Pt discharging home with self care. Pt has co pay cards for her Entresto and Eliquis. She is planning on trying to use Good Rx card for some of the other medications to see if this will lower the cost. Pt has transportation home.    Expected Discharge Date:  12/18/17               Expected Discharge Plan:  Home/Self Care  In-House Referral:     Discharge planning Services  CM Consult  Post Acute Care Choice:    Choice offered to:     DME Arranged:    DME Agency:     HH Arranged:    HH Agency:     Status of Service:  Completed, signed off  If discussed at [employer] Length of Stay Meetings, dates discussed:    Additional Comments:  Kermit BaloKelli F Marieke Lubke, RN 12/18/2017, 4:33 PM

## 2017-12-18 NOTE — Progress Notes (Signed)
Progress Note  Patient Name: [name] Date of Encounter: 12/18/2017  Primary Cardiologist: Lesleigh Noe, MD   Subjective   Feels great, wants to go. No CP, no SOB  Inpatient Medications    Scheduled Meds: . apixaban  5 mg Oral BID  . aspirin EC  81 mg Oral Daily  . atorvastatin  80 mg Oral Q2000  . digoxin  0.25 mg Oral Daily  . feeding supplement (ENSURE ENLIVE)  237 mL Oral BID BM  . furosemide  40 mg Intravenous BID  . insulin aspart  0-5 Units Subcutaneous QHS  . insulin aspart  0-9 Units Subcutaneous TID WC  . metoprolol succinate  100 mg Oral BID  . multivitamin with minerals  1 tablet Oral Daily  . potassium chloride  40 mEq Oral BID  . sacubitril-valsartan  1 tablet Oral BID   Continuous Infusions:  PRN Meds: acetaminophen **OR** acetaminophen, albuterol, nitroGLYCERIN   Vital Signs    Vitals:   12/17/17 1942 12/17/17 2332 12/18/17 0351 12/18/17 0734  BP: 117/80 118/82 109/73 108/71  Pulse: (!) 103 92 87 80  Resp: (!) 24 16 16 12   Temp: 97.6 F (36.4 C) 97.7 F (36.5 C) 98 F (36.7 C) 98.4 F (36.9 C)  TempSrc: Oral Oral Oral Oral  SpO2: 98% 98% 97% 99%  Weight:   156 lb 1.4 oz (70.8 kg)   Height:        Intake/Output Summary (Last 24 hours) at 12/18/2017 1127 Last data filed at 12/18/2017 0900 Gross per 24 hour  Intake 960 ml  Output -  Net 960 ml   Filed Weights   12/16/17 0738 12/17/17 0422 12/18/17 0351  Weight: 162 lb (73.5 kg) 156 lb 1.4 oz (70.8 kg) 156 lb 1.4 oz (70.8 kg)    Telemetry    Now NSR with first degree AVB- Personally Reviewed  ECG    No new- Personally Reviewed  Physical Exam   GEN: Well nourished, well developed, in no acute distress  HEENT: normal  Neck: no JVD, carotid bruits, or masses Cardiac: RRR; no murmurs, rubs, or gallops,no edema  Respiratory:  clear to auscultation bilaterally, normal work of breathing GI: soft, nontender, nondistended, + BS MS: no deformity or atrophy  Skin: warm and dry, no  rash Neuro:  Alert and Oriented x 3, Strength and sensation are intact Psych: euthymic mood, full affect   Labs    Chemistry Recent Labs  Lab 12/16/17 0742 12/16/17 0754 12/17/17 0653 12/18/17 0349  NA 141 145 143 144  K 3.8 3.9 3.2* 4.4  CL 101 107 102 103  CO2 24  --  31 30  GLUCOSE 276* 260* 114* 95  BUN 8 10 5* 13  CREATININE 1.35* 1.10* 0.96 1.08*  CALCIUM 9.3  --  9.2 9.8  GFRNONAA 42*  --  >60 55*  GFRAA 49*  --  >60 >60  ANIONGAP 16*  --  10 11     Hematology Recent Labs  Lab 12/16/17 0742 12/16/17 0754 12/17/17 0653 12/18/17 0349  WBC 8.5  --  6.3 5.0  RBC 4.71  --  4.47 4.62  HGB 14.1 16.7* 13.4 14.1  HCT 46.6* 49.0* 42.4 44.5  MCV 98.9  --  94.9 96.3  MCH 29.9  --  30.0 30.5  MCHC 30.3  --  31.6 31.7  RDW 15.8*  --  15.8* 15.9*  PLT 334  --  258 269    Cardiac Enzymes Recent Labs  Lab 12/16/17 1835 12/17/17 0032 12/17/17 0653  TROPONINI 0.35* 0.35* 0.36*    Recent Labs  Lab 12/16/17 0751  TROPIPOC 0.26*     BNP Recent Labs  Lab 12/16/17 0742  BNP 1,457.4*     DDimer No results for input(s): DDIMER in the last 168 hours.   Radiology    No results found.  Cardiac Studies   EF 25%  Patient Profile     57 y.o. female presented to the ER with pH of 7.1, PCO2 of 76 with hypoxia, hypercarbic respiratory failure, respiratory acidosis with rapid atrial fibrillation flutter, dilated cardiomyopathy.  Assessment & Plan    Hypercapnic respiratory failure - Improved. Feels great  Ischemic dilated cardiomyopathy -EF 20 to 25%, good diuresis 1.7 L out net, continue today. Increase to BID -Likely exacerbated by rapid atrial flutter.  She was not taking Toprol supposedly at home.  Creatinine stable  -I am comfortable with change to PO lasix 40mg  QD today and home this afternoon.  -Entresto -Toprol  Atrial fibrillation/flutter rapid, parox -NSR with first degree AVB now. Great.   - continue Toprol.   Troponin elevation -Demand  ischemia in the setting of her rapid atrial fibrillation and cardiomyopathy. Stable  OK with DC home   For questions or updates, please contact [facility] Please consult [email or web address] for contact info under Cardiology/STEMI.      Signed, Donato SchultzMark Skains, MD  12/18/2017, 11:27 AM

## 2017-12-18 NOTE — Discharge Instructions (Signed)
It has been a pleasure taking care of you! You were admitted due to Atrial fibrillation. We have treated you with medications to control your heart rate. With that your symptoms improved to the point we think it is safe to let you go home and follow up with your primary care doctor.   There could be some changes made to your home medications during this hospitalization. Please, make sure to read the directions before you take them. The names and directions on how to take these medications are found on this discharge paper under medication section.    Information on my medicine - ELIQUIS (apixaban)  This medication education was reviewed with me or my healthcare representative as part of my discharge preparation.    Why was Eliquis prescribed for you? Eliquis was prescribed for you to reduce the risk of a blood clot forming that can cause a stroke if you have a medical condition called atrial fibrillation (a type of irregular heartbeat).  What do You need to know about Eliquis ? Take your Eliquis TWICE DAILY - one tablet in the morning and one tablet in the evening with or without food. If you have difficulty swallowing the tablet whole please discuss with your pharmacist how to take the medication safely.  Take Eliquis exactly as prescribed by your doctor and DO NOT stop taking Eliquis without talking to the doctor who prescribed the medication.  Stopping may increase your risk of developing a stroke.  Refill your prescription before you run out.  After discharge, you should have regular check-up appointments with your healthcare provider that is prescribing your Eliquis.  In the future your dose may need to be changed if your kidney function or weight changes by a significant amount or as you get older.  What do you do if you miss a dose? If you miss a dose, take it as soon as you remember on the same day and resume taking twice daily.  Do not take more than one dose of ELIQUIS at the  same time to make up a missed dose.  Important Safety Information A possible side effect of Eliquis is bleeding. You should call your healthcare provider right away if you experience any of the following: ? Bleeding from an injury or your nose that does not stop. ? Unusual colored urine (red or dark brown) or unusual colored stools (red or black). ? Unusual bruising for unknown reasons. ? A serious fall or if you hit your head (even if there is no bleeding).  Some medicines may interact with Eliquis and might increase your risk of bleeding or clotting while on Eliquis. To help avoid this, consult your healthcare provider or pharmacist prior to using any new prescription or non-prescription medications, including herbals, vitamins, non-steroidal anti-inflammatory drugs (NSAIDs) and supplements.  This website has more information on Eliquis (apixaban): http://www.eliquis.com/eliquis/home

## 2017-12-19 ENCOUNTER — Encounter: Payer: Self-pay | Admitting: Internal Medicine

## 2017-12-19 NOTE — Discharge Summary (Signed)
Family [facility] Discharge Summary  Patient name: Candice ShortJanet Thomas Medical record number: [id number] Date of birth: 1961/03/24 Age: 57 y.o. Gender: female Date of Admission: 12/16/2017 Date of Discharge: 12/18/2017 Admitting Physician: Tobey GrimJeffrey H Walden, MD  Primary Care Provider: Patient, No Pcp Per Consultants: Cardiology  Indication for Hospitalization: Shortness of breath   Discharge Diagnoses/Problem List:  HTN CAD Atrial flutter Cardiomyopathy Prediabetes  Disposition: Home  Discharge Condition: Stable   Discharge Exam:  GEN: Well nourished, well developed, in no acute distress  HEENT: normal  Neck: no JVD, carotid bruits, or masses Cardiac: RRR; no murmurs, rubs, or gallops,no edema  Respiratory:  clear to auscultation bilaterally, normal work of breathing GI: soft, nontender, nondistended, + BS MS: no deformity or atrophy  Skin: warm and dry, no rash Neuro:  Alert and Oriented x 3, Strength and sensation are intact Psych: euthymic mood, full affect   Brief Hospital Course:  Patient is 57 yo female with a past medical history significant for NSTEMI, CABG, HFrEF, CAD, HTN and prediabetes who presented with shortness of breath. Patient was found to be acidotic secondary to hypoxia and hypercarbia. Patient was placed on bipap and transitioned to [place] quickly thereafter with improvement in oxygen saturation. Patient was also found to have afib with RVR and was started initially on a diltiazem drip that was subsequently discontinued due to her HFrEF. Rate was controlled with metoprolol. Troponins were also elevated initially at 0.26 but eventually leveled off at 0.36. Elevation was thought to be secondary to demand ischemia. Patient was also found to have an elevated BNP at 1457.4. and was diuresed with IV lasix with further improvement in her respiratory status. Worsening respiratory distress was thought to be secondary to CHF exacerbation secondary to a flutter  with RVR in the setting of medications non adherence. After being discharged from the hospital on 5/28, patient was not able to fill her diltiazem and metoprolol she was only able to fill her entresto and eliquis (LA thrombus) which led to atrial flutter and CHF exacerbation. Patient was kept on the rest of her home medications while inpatient and was followed by cardiology throughout hospitalization. On hospital day 2, respiratory status was back to baseline with HR in the 80-90's. Patient was euvolemic with no signs of volume overload. She was discharged home in a stable condition with close follow up with Cardiology.  Issues for Follow Up:  1. Follow up with Cardiology, might need medications titration. 2. Repeat BMP 3. Follow up on prediabetes  Significant Procedures: None  Significant Labs and Imaging:  Recent Labs  Lab 12/16/17 0742 12/16/17 0754 12/17/17 0653 12/18/17 0349  WBC 8.5  --  6.3 5.0  HGB 14.1 16.7* 13.4 14.1  HCT 46.6* 49.0* 42.4 44.5  PLT 334  --  258 269   Recent Labs  Lab 12/16/17 0742 12/16/17 0754 12/17/17 0653 12/18/17 0349  NA 141 145 143 144  K 3.8 3.9 3.2* 4.4  CL 101 107 102 103  CO2 24  --  31 30  GLUCOSE 276* 260* 114* 95  BUN 8 10 5* 13  CREATININE 1.35* 1.10* 0.96 1.08*  CALCIUM 9.3  --  9.2 9.8    Ct Angio Chest Pe W And/or Wo Contrast  Result Date: 12/16/2017 CLINICAL DATA:  Respiratory distress EXAM: CT ANGIOGRAPHY CHEST WITH CONTRAST TECHNIQUE: Multidetector CT imaging of the chest was performed using the standard protocol during bolus administration of intravenous contrast. Multiplanar CT image reconstructions and MIPs were obtained  to evaluate the vascular anatomy. CONTRAST:  ISOVUE-370 IOPAMIDOL (ISOVUE-370) INJECTION 76% COMPARISON:  Chest x-ray today.  Chest CT 12/04/2017 FINDINGS: Cardiovascular: Diffuse coronary artery and aortic calcifications. Prior CABG and mitral valve replacement. No evidence of aortic aneurysm. Mild  cardiomegaly. No filling defects in the pulmonary arteries to suggest pulmonary emboli. Mediastinum/Nodes: No mediastinal, hilar, or axillary adenopathy. Lungs/Pleura: Small bilateral pleural effusions. Bilateral lower lobe atelectasis or infiltrates, right greater than left. Cannot exclude pneumonia. Scattered ground-glass opacities throughout the lungs have increased since prior study and may reflect early edema. Upper Abdomen: Imaging into the upper abdomen shows no acute findings. Musculoskeletal: Chest wall soft tissues are unremarkable. No acute bony abnormality. Review of the MIP images confirms the above findings. IMPRESSION: No evidence of pulmonary embolus. Cardiomegaly.  Prior CABG. Increasing ground-glass opacities throughout the lungs, favor edema. Small bilateral effusions with bibasilar atelectasis or infiltrates. Electronically Signed   By: Charlett Nose M.D.   On: 12/16/2017 09:05   Dg Chest Portable 1 View  Result Date: 12/16/2017 CLINICAL DATA:  Respiratory distress EXAM: PORTABLE CHEST 1 VIEW COMPARISON:  Chest radiograph and chest CT Dec 04, 2017 FINDINGS: There is no edema or consolidation. There is generalized cardiomegaly, stable. The pulmonary vascularity is normal. Patient is status post coronary artery bypass grafting and mitral valve replacement. There is aortic atherosclerosis. No adenopathy. No bone lesions. IMPRESSION: Persistent generalized cardiac enlargement. No edema or consolidation. Status post coronary artery bypass grafting and mitral valve replacement. There is aortic atherosclerosis. Aortic Atherosclerosis (ICD10-I70.0). Electronically Signed   By: Bretta Bang III M.D.   On: 12/16/2017 08:10   Results/Tests Pending at Time of Discharge: None  Discharge Medications:  Allergies as of 12/18/2017      Reactions   Other Itching   GEL ON ELECTRODES FOR EKG      Medication List    TAKE these medications   acetaminophen 500 MG tablet Commonly known as:   TYLENOL Take 1,000 mg by mouth as needed for mild pain.   apixaban 5 MG Tabs tablet Commonly known as:  ELIQUIS Take 1 tablet (5 mg total) by mouth 2 (two) times daily.   aspirin EC 81 MG tablet Take 1 tablet (81 mg total) by mouth daily.   atorvastatin 80 MG tablet Commonly known as:  LIPITOR TAKE 1 TABLET (80 MG TOTAL) BY MOUTH DAILY AT 6 PM. What changed:  when to take this   digoxin 0.25 MG tablet Commonly known as:  LANOXIN Take 1 tablet (0.25 mg total) by mouth daily.   diltiazem 120 MG 24 hr capsule Commonly known as:  CARDIZEM CD Take 1 capsule (120 mg total) by mouth daily.   feeding supplement (ENSURE ENLIVE) Liqd Take 237 mLs by mouth 2 (two) times daily between meals.   furosemide 40 MG tablet Commonly known as:  LASIX Take 1 tablet (40 mg total) by mouth daily.   metoprolol succinate 100 MG 24 hr tablet Commonly known as:  TOPROL-XL Take 1 tablet (100 mg total) by mouth 2 (two) times daily. Take with or immediately following a meal.   multivitamin with minerals Tabs tablet Take 1 tablet by mouth daily.   nitroGLYCERIN 0.4 MG SL tablet Commonly known as:  NITROSTAT Place 0.4 mg under the tongue every 5 (five) minutes as needed for chest pain (up to 3 doses before calling EMS).   sacubitril-valsartan 49-51 MG Commonly known as:  ENTRESTO Take 1 tablet by mouth 2 (two) times daily.   triamcinolone cream  0.1 % Commonly known as:  KENALOG Apply 1 application topically at bedtime.       Discharge Instructions: Please refer to Patient Instructions section of EMR for full details.  Patient was counseled important signs and symptoms that should prompt return to medical care, changes in medications, dietary instructions, activity restrictions, and follow up appointments.   Follow-Up Appointments: Follow-up Information    Lyn Records, MD Follow up.   Specialty:  Cardiology Contact information: [address] [place]  [address] [phone]           Lovena Neighbours, MD 12/19/2017, 12:20 PM PGY-2, [facility]

## 2017-12-20 ENCOUNTER — Ambulatory Visit (INDEPENDENT_AMBULATORY_CARE_PROVIDER_SITE_OTHER): Payer: 59 | Admitting: Nurse Practitioner

## 2017-12-20 ENCOUNTER — Telehealth (HOSPITAL_COMMUNITY): Payer: Self-pay

## 2017-12-20 ENCOUNTER — Encounter: Payer: Self-pay | Admitting: *Deleted

## 2017-12-20 ENCOUNTER — Encounter: Payer: Self-pay | Admitting: Nurse Practitioner

## 2017-12-20 VITALS — BP 90/60 | HR 57 | Ht 62.5 in | Wt 150.1 lb

## 2017-12-20 DIAGNOSIS — I480 Paroxysmal atrial fibrillation: Secondary | ICD-10-CM | POA: Diagnosis not present

## 2017-12-20 DIAGNOSIS — I10 Essential (primary) hypertension: Secondary | ICD-10-CM | POA: Diagnosis not present

## 2017-12-20 DIAGNOSIS — I5042 Chronic combined systolic (congestive) and diastolic (congestive) heart failure: Secondary | ICD-10-CM

## 2017-12-20 DIAGNOSIS — I25709 Atherosclerosis of coronary artery bypass graft(s), unspecified, with unspecified angina pectoris: Secondary | ICD-10-CM

## 2017-12-20 MED ORDER — ATORVASTATIN CALCIUM 80 MG PO TABS: 80.0000 mg | ORAL_TABLET | Freq: Every day | ORAL | 11 refills | Status: DC

## 2017-12-20 NOTE — Telephone Encounter (Signed)
Attempted to call patient to see if she is interested in the Cardiac Rehab Program - lm on vm °

## 2017-12-20 NOTE — Progress Notes (Signed)
CARDIOLOGY OFFICE NOTE  Date:  12/20/2017    [name] Date of Birth: Aug 15, 1960 Medical Record #[id number]  PCP:  Patient, No Pcp Per  Cardiologist:  Katrinka Blazing  Chief Complaint  Patient presents with  . Cardiomyopathy  . Atrial Fibrillation    TOC visit - seen for Dr. Katrinka Blazing    History of Present Illness: [name] is a 57 y.o. female who presents today for a TOC visit. Seen for Dr. Katrinka Blazing.   She has a history of ischemic cardiomyopathy, dilated cardiomyopathy with EF 20 to 25%. She has a history of HTN, HLD, chronic systolic CHF, CAD s/p CABG 2007 with subsequent PCI to SVG 2017 for NSTEMI. She has chronic stable angina mostly related to emotional stress. She has had prior MV repair with ring - has mild to moderate stenosis and mild MR noted on most recent echo.   Her history is somewhat difficult to follow.   She has been to the hospital twice over the past few weeks. The first episode was for chest pain - found to have atrial flutter - had been on coumadin the past - however had been discontinued and she had not been on [facility] for many years. She was started on Eliquis during that admission. 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 Discharged on beta blocker, dig and CCB - however was not able to fill the prescriptions. Was seen by EP - Dr. Johney Frame - to consider ablation after 3 weeks of Eliquis. She did undergo LHC 12/09/17 which revealed progressive graft disease (details below) and was recommended for medical management. It was felt that her presenting event was likely due to Aflutter with RVR. Home plavix discontinued given need for anticoagulation. EF is 35 to 30% - possibly tachycardia mediated.   Ended up coming back to the hospital since she could not get medicines filled. She had  hypoxia, hypercarbic respiratory failure, & respiratory acidosis with rapid atrial fibrillation flutter. She was treated with Bipap, nebs and Lasix. Home medicines were  restarted. She was placed on Entresto as well.   Comes in today. Here alone. She has no PCP. She was just discharged 2 days ago. Really hasn't been home long enough to see how she is doing. Some tiredness yesterday - felt like she may over did things yesterday. Her breathing is better. She smells quite heavily of smoke. Her husband smokes - she says she is not smoking. No chest pain. She is asking about being on Lisinopril - she is on Entresto - unclear if she is possibly taking both. Unclear if she is taking her statin. BP is soft but she is not dizzy or lightheaded. She is to see Dr. Johney Frame next week.   Past Medical History:  Diagnosis Date  . Atrial fibrillation (HCC)   . Atrial flutter (HCC)   . CHF (congestive heart failure) (HCC)   . Coronary atherosclerosis of native coronary artery   . HTN (hypertension)   . Hx of CABG    LIMA-LAD, SVG-DIAG, SVG-OM, SVG-RCA  . Hypercholesteremia   . Myocardial infarct (HCC) 2007  . NSTEMI (non-ST elevated myocardial infarction) (HCC) 10/13/2015  . Obesity     Past Surgical History:  Procedure Laterality Date  . ABDOMINAL HYSTERECTOMY    . CARDIAC CATHETERIZATION N/A 10/14/2015   Procedure: Left Heart Cath and Cors/Grafts Angiography;  Surgeon: Marykay Lex, MD;  Location: [facility] INVASIVE CV LAB;  Service: Cardiovascular;  Laterality: N/A;  .  CARDIAC CATHETERIZATION N/A 10/14/2015   Procedure: Coronary Stent Intervention;  Surgeon: Marykay Lexavid W Harding, MD;  [facility];  Service: Cardiovascular;  Laterality: N/A;  . CESAREAN SECTION  1985  . CORONARY ANGIOPLASTY WITH STENT PLACEMENT  2003   LAD Promus DES [name]/notes 10/13/2015  . CORONARY ARTERY BYPASS GRAFT  03/2006   "CABG X3"  . LEFT HEART CATH AND CORS/GRAFTS ANGIOGRAPHY N/A 12/09/2017   Procedure: LEFT HEART CATH AND CORS/GRAFTS ANGIOGRAPHY;  Surgeon: Marykay LexHarding, David W, MD;  [facility];  Service: Cardiovascular;  Laterality: N/A;  . MITRAL VALVE REPAIR     [name]/notes  10/13/2015  . TEE WITHOUT CARDIOVERSION N/A 12/07/2017   Procedure: TRANSESOPHAGEAL ECHOCARDIOGRAM (TEE);  Surgeon: [name], Kenneth C, MD;  [facility];  Service: Cardiovascular;  Laterality: N/A;     Medications: Current Meds  Medication Sig  . acetaminophen (TYLENOL) 500 MG tablet Take 1,000 mg by mouth as needed for mild pain.  [name]. apixaban (ELIQUIS) 5 MG TABS tablet Take 1 tablet (5 mg total) by mouth 2 (two) times daily.  [name]. aspirin EC 81 MG tablet Take 1 tablet (81 mg total) by mouth daily.  . digoxin (LANOXIN) 0.25 MG tablet Take 1 tablet (0.25 mg total) by mouth daily.  [name]. diltiazem (CARDIZEM CD) 120 MG 24 hr capsule Take 1 capsule (120 mg total) by mouth daily.  . furosemide (LASIX) 40 MG tablet Take 1 tablet (40 mg total) by mouth daily.  . metoprolol succinate (TOPROL-XL) 100 MG 24 hr tablet Take 1 tablet (100 mg total) by mouth 2 (two) times daily. Take with or immediately following a meal.  . Multiple Vitamin (MULTIVITAMIN WITH MINERALS) TABS tablet Take 1 tablet by mouth daily.  . nitroGLYCERIN (NITROSTAT) 0.4 MG SL tablet Place 0.4 mg under the tongue every 5 (five) minutes as needed for chest pain (up to 3 doses before calling EMS).   . sacubitril-valsartan (ENTRESTO) 49-51 MG Take 1 tablet by mouth 2 (two) times daily.  [name]. triamcinolone cream (KENALOG) 0.1 % Apply 1 application topically at bedtime.     Allergies: Allergies  Allergen Reactions  . Other Itching    GEL ON ELECTRODES FOR EKG    Social History: The patient  reports that she has quit smoking. Her smoking use included cigarettes. She has a 30.00 pack-year smoking history. She has never used smokeless tobacco. She reports that she drinks about 0.6 oz of alcohol per week. She reports that she does not use drugs.   Family History: The patient's family history includes Hypertension in her brother, father, and sister; Stroke in her sister.   Review of Systems: Please see the history of present illness.    Otherwise, the review of systems is positive for none.   All other systems are reviewed and negative.   Physical Exam: VS:  BP 90/60 (BP Location: Left Arm, Patient Position: Sitting, Cuff Size: Normal)   Pulse (!) 57   Ht 5' 2.5" (1.588 m)   Wt 150 lb 1.9 oz (68.1 kg)   BMI 27.02 kg/m  .  BMI Body mass index is 27.02 kg/m.  Wt Readings from Last 3 Encounters:  12/20/17 150 lb 1.9 oz (68.1 kg)  12/18/17 156 lb 1.4 oz (70.8 kg)  12/13/17 162 lb 3.2 oz (73.6 kg)    General: Appears older than her stated age. Smells heavily of tobacco. She is alert and in no acute distress.   HEENT: Normal.  Neck: Supple.  Cardiac: Regular  rate and rhythm. ?S3. No significant edema.  Respiratory:  Lungs are coarse to auscultation bilaterally with normal work of breathing.  GI: Soft and nontender.  MS: No deformity or atrophy. Gait and ROM intact.  Skin: Warm and dry. Color is normal.  Neuro:  Strength and sensation are intact and no gross focal deficits noted.  Psych: Alert, appropriate and with normal affect.   LABORATORY DATA:  EKG:  EKG is ordered today. This demonstrates sinus brady with 1st degree AV block, anterolateral T wave changes.  Lab Results  Component Value Date   WBC 5.0 12/18/2017   HGB 14.1 12/18/2017   HCT 44.5 12/18/2017   PLT 269 12/18/2017   GLUCOSE 95 12/18/2017   CHOL 123 12/04/2017   TRIG 89 12/04/2017   HDL 32 (L) 12/04/2017   LDLCALC 73 12/04/2017   ALT 17 08/24/2017   AST 16 08/24/2017   NA 144 12/18/2017   K 4.4 12/18/2017   CL 103 12/18/2017   CREATININE 1.08 (H) 12/18/2017   BUN 13 12/18/2017   CO2 30 12/18/2017   TSH 0.721 10/13/2015   INR 1.49 12/09/2017   HGBA1C 5.9 (H) 12/05/2017     BNP (last 3 results) Recent Labs    12/04/17 1536 12/16/17 0742  BNP 739.8* 1,457.4*    ProBNP (last 3 results) No results for input(s): PROBNP in the last 8760 hours.   Other Studies Reviewed Today:  LEFT HEART CATH AND CORS/GRAFTS ANGIOGRAPHY 11/2017    Conclusion     Prox Cx lesion is 70% stenosed. Ost 1st Mrg to 1st Mrg lesion is 65% stenosed.  Prox Cx to Mid Cx lesion is 100% stenosed - after 1st Mrg  SVG-2nd Mrg: Origin lesion is 100% stenosed. Known CTO.  Prox LAD lesion is 75% stenosed pre-2nd Diag. Prox LAD to Mid LAD lesion is 100% stenosed AFTER 2nd Diag & SP2 (SP1 & SP2 fill rPDA with faint collaterals)  LIMA-LAD graft was visualized by angiography. The graft exhibits no disease.  Ost 2nd Diag lesion is 100% stenosed.  SVG-Diag2 graft was visualized by angiography and is normal in caliber. The graft exhibits no disease --collateral branches fill 2nd Mrg into the distal Cx.  Prox RCA to Dist RCA lesion is 100% stenosed - known CTO.  NEW: Seq SVG- RPAV-dRCA graft was visualized by angiography and is large. Prox Graft to Mid Graft lesion before Acute Mrg is 100% stenosed.  LV end diastolic pressure is mildly elevated.   Newly found ostial occlusion of likely sequential SVG-AM-dRCA (proximal to stent from March 2017).  This is in addition to known CTO of SVG-OM 2 Severe native disease with occluded mid RCA, mid circumflex and mid LAD as well as Diag2 Now 2 out of 4-5 grafts remain patent -SVG-Diag2 is patent with collaterals filling OM 2 and distal Circumflex, LIMA-LAD is patent.  Suspect mild troponin elevation was related to new onset A. fib RVR with ischemic cardiomyopathy and severe existing CAD.  Plan: Return to nursing unit with TR band removal per protocol. Restart oral anticoagulation (Eliquis) 6 hours after TR band removal Continue CHF management.   Bryan Lemma, M.D., M.S. Interventional Cardiologist    TEE Study Conclusions 52019  - Left ventricle: Moderate LVH. Systolic function was severely   reduced. The estimated ejection fraction was in the range of 25%   to 30%. Diffuse hypokinesis. - Aortic valve: Trileaflet; mildly thickened leaflets. There was   mild regurgitation. - Mitral valve: S/p  Annuloplasty repair - visualized in  2D and 3D.   There is mild to moderate stenosis (aliasing and jet density) and   mild regurgitation. The posterior leaflet is mostly fixed. - Left atrium: Severely dilated with smoke. The appendage is   well-visualized with 2D and 3D modes- there is a small thrombus   noted at the apex of the appendage with very low emptying   velocities. - Right ventricle: Mildly reduced systolic function. - Right atrium: The atrium was dilated. - Atrial septum: No defect or patent foramen ovale was identified. - Tricuspid valve: Moderate regurgitation. Peak RV-RA gradient (S):   30 mm Hg + RAP.  Impressions:  - LAA thrombus idenitified. Cardioversion was not attempted. LVEF   25-30%, global hypokinesis with regional variation. s/p mitral   annuloplasty repair with mild to moderate stenosis and mild   regurgitation. Severe LAE with smoke, consistent with low flow   state and notably low appendage emptying velocities which is   conducive for thrombus formation.   TTE Study Conclusions 11/2017  - Left ventricle: False tendon in mid LV cavity of no clinical   significance. The cavity size was mildly dilated. There was   moderate asymmetric hypertrophy. Systolic function was severely   reduced. The estimated ejection fraction was in the range of 25%   to 30%. Severe diffuse hypokinesis with distinct regional wall   motion abnormalities. There is akinesis of the inferolateral   myocardium. The study was not technically sufficient to allow   evaluation of LV diastolic dysfunction due to atrial   fibrillation. Doppler parameters are consistent with high   ventricular filling pressure. - Ventricular septum: The contour showed diastolic flattening and   systolic flattening. These changes are consistent with RV volume   and pressure overload. - Aortic valve: There was trivial regurgitation. - Mitral valve: Mobility of the posterior leaflet was restricted to   the  point of immobility. The findings are consistent with   moderate stenosis. There was mild regurgitation directed   eccentrically. Mean gradient (D): 9 mm Hg. Valve area by   continuity equation (using LVOT flow): 1.02 cm^2. - Left atrium: The atrium was severely dilated. - Right ventricle: The cavity size was mildly dilated. Wall   thickness was normal. Systolic function was moderately reduced. - Pulmonary arteries: PA peak pressure: 59 mm Hg (S).  Impressions:  - The right ventricular systolic pressure was increased consistent   with moderate pulmonary hypertension.   Assessment/Plan:  1. Hypercapnic respiratory failure - most likely multifactorial with lung disease, CHF and non compliance. Improved with Bipap and diuretics and resumption of medicines. Has only been home for 2 days. Seems to be ok but overall situation tenuous at best. Will need close follow up.   2. Ischemic dilated cardiomyopathy - EF of 20 to 25% - now on more ideal therapy - unclear about compliance and I worry about compliance going forward. She may be taking her ACE - she is throw this bottle away. She says she has Entresto. We have given her the assistance sheet to obtain PCP.   3. AF/typical atrial flutter/NSVT - has seen EP - sees again next week to consider ablation. I could not stress enough to her about staying compliant with her Eliquis.   4. Troponin elevation - was felt to be demand ischemia  5. CAD - recent cath noted - apparently to continue with medical therapy.   6.  S/p MV repair - Mild to moderate MS by TEE on prior admission  Current medicines are reviewed with the patient today.  The patient does not have concerns regarding medicines other than what has been noted above.  The following changes have been made:  See above.  Labs/ tests ordered today include:    Orders Placed This Encounter  Procedures  . EKG 12-Lead     Disposition:   FU with Dr. Johney Frame as planned. Will get her  back to see me or Dr. Katrinka Blazing in a few weeks. Suspect she will need follow up echo after ablation and 3 months of CHF treatment.   Patient is agreeable to this plan and will call if any problems develop in the interim.   SignedNorma Fredrickson, NP  12/20/2017 10:53 AM  [facility] [address] [place], [place]  [address] Phone: [phone] Fax: [phone]

## 2017-12-20 NOTE — Patient Instructions (Addendum)
We will be checking the following labs today - NONE   Medication Instructions:    Continue with your current medicines. BUT  Throw your bottle of Lisinopril away.   I am giving you a written prescription for Lipitor to get back on in case you do not have at home     Testing/Procedures To Be Arranged:  N/A  Follow-Up:   See Dr. Johney FrameAllred next week as planned.   See Dr. Katrinka BlazingSmith in a few weeks.    Other Special Instructions:   We have given you instructions on how to obtain a PCP    If you need a refill on your cardiac medications before your next appointment, please call your pharmacy.   Call the [facility] office at [phone] if you have any questions, problems or concerns.

## 2017-12-21 ENCOUNTER — Telehealth: Payer: Self-pay | Admitting: Nurse Practitioner

## 2017-12-21 NOTE — Telephone Encounter (Signed)
Pt dropped off 3 sets of forms to be completed. I called her she is aware of the charge per form,the release that needs to be signed and also the 10/14 day period.  She will come back to office and get this taken care of.

## 2017-12-28 ENCOUNTER — Ambulatory Visit (INDEPENDENT_AMBULATORY_CARE_PROVIDER_SITE_OTHER): Payer: 59 | Admitting: Internal Medicine

## 2017-12-28 ENCOUNTER — Encounter: Payer: Self-pay | Admitting: Internal Medicine

## 2017-12-28 VITALS — BP 124/62 | HR 58 | Ht 62.5 in | Wt 152.0 lb

## 2017-12-28 DIAGNOSIS — I519 Heart disease, unspecified: Secondary | ICD-10-CM

## 2017-12-28 DIAGNOSIS — I10 Essential (primary) hypertension: Secondary | ICD-10-CM | POA: Diagnosis not present

## 2017-12-28 DIAGNOSIS — I483 Typical atrial flutter: Secondary | ICD-10-CM | POA: Diagnosis not present

## 2017-12-28 DIAGNOSIS — I255 Ischemic cardiomyopathy: Secondary | ICD-10-CM | POA: Diagnosis not present

## 2017-12-28 MED ORDER — NITROGLYCERIN 0.4 MG SL SUBL: 0.4000 mg | SUBLINGUAL_TABLET | SUBLINGUAL | 3 refills | Status: DC | PRN

## 2017-12-28 NOTE — Patient Instructions (Signed)
Medication Instructions:  Your physician recommends that you continue on your current medications as directed. Please refer to the Current Medication list given to you today.   Labwork: None ordered   Testing/Procedures: None ordered   Follow-Up: Your physician recommends that you schedule a follow-up appointment in: 3 months with Amber Seiler, NP   Any Other Special Instructions Will Be Listed Below (If Applicable).     If you need a refill on your cardiac medications before your next appointment, please call your pharmacy.   

## 2017-12-28 NOTE — Progress Notes (Signed)
PCP: Patient, No Pcp Per Primary Cardiologist: Dr Katrinka BlazingSmith Primary EP: Dr Johney FrameAllred  [name] is a 57 y.o. female who presents today for routine electrophysiology followup.  Since her recent hospital discharge, the patient reports doing very well.  She has converted to sinus rhythm.  Her angina has resolved.  She feels "much better" and is excited about returning to work this week.  Today, she denies symptoms of palpitations, chest pain, shortness of breath,  lower extremity edema, dizziness, presyncope, or syncope.  The patient is otherwise without complaint today.   Past Medical History:  Diagnosis Date  . Atrial fibrillation (HCC)   . Atrial flutter (HCC)   . CHF (congestive heart failure) (HCC)   . Coronary atherosclerosis of native coronary artery   . HTN (hypertension)   . Hx of CABG    LIMA-LAD, SVG-DIAG, SVG-OM, SVG-RCA  . Hypercholesteremia   . Myocardial infarct (HCC) 2007  . NSTEMI (non-ST elevated myocardial infarction) (HCC) 10/13/2015  . Obesity    Past Surgical History:  Procedure Laterality Date  . ABDOMINAL HYSTERECTOMY    . CARDIAC CATHETERIZATION N/A 10/14/2015   Procedure: Left Heart Cath and Cors/Grafts Angiography;  Surgeon: Marykay Lexavid W Harding, MD;  [facility];  Service: Cardiovascular;  Laterality: N/A;  . CARDIAC CATHETERIZATION N/A 10/14/2015   Procedure: Coronary Stent Intervention;  Surgeon: Marykay Lexavid W Harding, MD;  [facility];  Service: Cardiovascular;  Laterality: N/A;  . CESAREAN SECTION  1985  . CORONARY ANGIOPLASTY WITH STENT PLACEMENT  2003   LAD Promus DES [name]/notes 10/13/2015  . CORONARY ARTERY BYPASS GRAFT  03/2006   "CABG X3"  . LEFT HEART CATH AND CORS/GRAFTS ANGIOGRAPHY N/A 12/09/2017   Procedure: LEFT HEART CATH AND CORS/GRAFTS ANGIOGRAPHY;  Surgeon: Marykay LexHarding, David W, MD;  [facility];  Service: Cardiovascular;  Laterality: N/A;  . MITRAL VALVE REPAIR     [name]/notes 10/13/2015  . TEE WITHOUT CARDIOVERSION  N/A 12/07/2017   Procedure: TRANSESOPHAGEAL ECHOCARDIOGRAM (TEE);  Surgeon: [name], Kenneth C, MD;  [facility];  Service: Cardiovascular;  Laterality: N/A;    ROS- all systems are reviewed and negatives except as per HPI above  Current Outpatient Medications  Medication Sig Dispense Refill  . acetaminophen (TYLENOL) 500 MG tablet Take 1,000 mg by mouth as needed for mild pain.    [name]. apixaban (ELIQUIS) 5 MG TABS tablet Take 1 tablet (5 mg total) by mouth 2 (two) times daily. 180 tablet 3  . aspirin EC 81 MG tablet Take 1 tablet (81 mg total) by mouth daily. 90 tablet 3  . atorvastatin (LIPITOR) 80 MG tablet Take 1 tablet (80 mg total) by mouth daily. 30 tablet 11  . digoxin (LANOXIN) 0.25 MG tablet Take 1 tablet (0.25 mg total) by mouth daily. 90 tablet 3  . diltiazem (CARDIZEM CD) 120 MG 24 hr capsule Take 1 capsule (120 mg total) by mouth daily. 90 capsule 3  . furosemide (LASIX) 40 MG tablet Take 1 tablet (40 mg total) by mouth daily. 90 tablet 2  . metoprolol succinate (TOPROL-XL) 100 MG 24 hr tablet Take 1 tablet (100 mg total) by mouth 2 (two) times daily. Take with or immediately following a meal. 180 tablet 3  . Multiple Vitamin (MULTIVITAMIN WITH MINERALS) TABS tablet Take 1 tablet by mouth daily.    . nitroGLYCERIN (NITROSTAT) 0.4 MG SL tablet Place 0.4 mg under the tongue every 5 (five) minutes as needed for chest pain (up to  3 doses before calling EMS).     . sacubitril-valsartan (ENTRESTO) 49-51 MG Take 1 tablet by mouth 2 (two) times daily. 180 tablet 3  . triamcinolone cream (KENALOG) 0.1 % Apply 1 application topically at bedtime.     No current facility-administered medications for this visit.     Physical Exam: Vitals:   12/28/17 1248  BP: 124/62  Pulse: (!) 58  SpO2: 99%  Weight: 152 lb (68.9 kg)  Height: 5' 2.5" (1.588 m)    GEN- The patient is well appearing, alert and oriented x 3 today.   Head- normocephalic, atraumatic Eyes-  Sclera clear,  conjunctiva pink Ears- hearing intact Oropharynx- clear Lungs- Clear to ausculation bilaterally, normal work of breathing Heart- Regular rate and rhythm, no murmurs, rubs or gallops, PMI not laterally displaced GI- soft, NT, ND, + BS Extremities- no clubbing, cyanosis, or edema  Wt Readings from Last 3 Encounters:  12/28/17 152 lb (68.9 kg)  12/20/17 150 lb 1.9 oz (68.1 kg)  12/18/17 156 lb 1.4 oz (70.8 kg)    EKG tracine 12/20/17 is reviewed and reveals sinus rhythm  Assessment and Plan:  1. Typical atrial flutter Recently found to have LAA thrombus and could not be cardioverted.  Now on eliquis.  Fortunately, she has converted to sinus rhythm. I have advised atrial flutter ablation.  She is very clear in her decision to decline ablation at this time.  She wishes to go back to work and try to return to a normal lifestyle.  She may reconsider if her atrial arrhythmias return. Continue life long anticoagulation.   2. NSVT Continue medical therapy asymptomatic  3. CAD/ ischemic CM S/p CABG Stable No change required today Dr Katrinka Blazing to continue medicine optimization.  If her EF remains depressed, could consider ICD implantation, though she is clear in her decision to avoid EP procedures at this time.  4. S/p MV repair Mild to moderate MS by recent TEE Dr Katrinka Blazing to manage  Follow-up with Dr Katrinka Blazing as scheduled Return to see EP NP in 2-3 months  Hillis Range MD, [facility] 12/28/2017 12:58 PM

## 2018-01-02 ENCOUNTER — Telehealth: Payer: Self-pay | Admitting: Internal Medicine

## 2018-01-02 NOTE — Telephone Encounter (Signed)
Spoke with Casimiro NeedleMichael @ [employer]. Made him aware patient needs to come to office and Pay for her forms to be processed. Patient has also been made aware of this. They will not be completed until payment is made and from that date its a 10/14 day period.

## 2018-01-02 NOTE — Telephone Encounter (Signed)
[name] is calling and needing to know if the fax was received concerning a Critical  Illness questionnaire form for this pt. Please advise.

## 2018-01-03 ENCOUNTER — Ambulatory Visit: Payer: 59 | Admitting: Interventional Cardiology

## 2018-01-03 ENCOUNTER — Telehealth: Payer: Self-pay | Admitting: Internal Medicine

## 2018-01-03 NOTE — Telephone Encounter (Signed)
Left pt voicemail to call back about the payment for her 3 forms dropped off here. Will need this to complete paperwork.

## 2018-01-16 ENCOUNTER — Telehealth (HOSPITAL_COMMUNITY): Payer: Self-pay

## 2018-01-16 NOTE — Telephone Encounter (Signed)
Attempted to call patient in regards to Cardiac Rehab - LM on VM 

## 2018-01-24 ENCOUNTER — Telehealth (HOSPITAL_COMMUNITY): Payer: Self-pay

## 2018-01-24 NOTE — Telephone Encounter (Signed)
2nd attempt to contact patient in regards to Cardiac Rehab - lm on vm. Sending letter. °

## 2018-01-30 ENCOUNTER — Other Ambulatory Visit: Payer: Self-pay | Admitting: Interventional Cardiology

## 2018-02-01 ENCOUNTER — Telehealth (HOSPITAL_COMMUNITY): Payer: Self-pay

## 2018-02-01 NOTE — Telephone Encounter (Signed)
3rd attempt to contact patient in regards to Cardiac Rehab - lm on vm °

## 2018-03-07 ENCOUNTER — Encounter: Payer: Self-pay | Admitting: Nurse Practitioner

## 2018-03-27 NOTE — Progress Notes (Deleted)
Electrophysiology Office Note Date: 03/27/2018  ID:  [name], DOB 12-14-1960, MRN [id number]  PCP: Patient, No Pcp Per Primary Cardiologist: Katrinka Blazing Electrophysiologist: Allred  CC: atrial flutter follow up  [name] is a 57 y.o. female seen today for Dr Johney Frame.  She presents today for routine electrophysiology followup.  Since last being seen in our clinic, the patient reports doing very well.  She denies chest pain, palpitations, dyspnea, PND, orthopnea, nausea, vomiting, dizziness, syncope, edema, weight gain, or early satiety.  Past Medical History:  Diagnosis Date  . Atrial fibrillation (HCC)   . Atrial flutter (HCC)   . CHF (congestive heart failure) (HCC)   . Coronary atherosclerosis of native coronary artery   . HTN (hypertension)   . Hx of CABG    LIMA-LAD, SVG-DIAG, SVG-OM, SVG-RCA  . Hypercholesteremia   . Myocardial infarct (HCC) 2007  . NSTEMI (non-ST elevated myocardial infarction) (HCC) 10/13/2015  . Obesity    Past Surgical History:  Procedure Laterality Date  . ABDOMINAL HYSTERECTOMY    . CARDIAC CATHETERIZATION N/A 10/14/2015   Procedure: Left Heart Cath and Cors/Grafts Angiography;  Surgeon: Marykay Lex, MD;  Location: [facility] INVASIVE CV LAB;  Service: Cardiovascular;  Laterality: N/A;  . CARDIAC CATHETERIZATION N/A 10/14/2015   Procedure: Coronary Stent Intervention;  Surgeon: Marykay Lex, MD;  Location: [facility] INVASIVE CV LAB;  Service: Cardiovascular;  Laterality: N/A;  . CESAREAN SECTION  1985  . CORONARY ANGIOPLASTY WITH STENT PLACEMENT  2003   LAD Promus DES [name] 10/13/2015  . CORONARY ARTERY BYPASS GRAFT  03/2006   "CABG X3"  . LEFT HEART CATH AND CORS/GRAFTS ANGIOGRAPHY N/A 12/09/2017   Procedure: LEFT HEART CATH AND CORS/GRAFTS ANGIOGRAPHY;  Surgeon: Marykay Lex, MD;  Location: [facility] INVASIVE CV LAB;  Service: Cardiovascular;  Laterality: N/A;  . MITRAL VALVE REPAIR     [name] 10/13/2015  . TEE WITHOUT CARDIOVERSION N/A 12/07/2017   Procedure: TRANSESOPHAGEAL ECHOCARDIOGRAM (TEE);  Surgeon: Chrystie Nose, MD;  Location: [facility] ENDOSCOPY;  Service: Cardiovascular;  Laterality: N/A;    Current Outpatient Medications  Medication Sig Dispense Refill  . acetaminophen (TYLENOL) 500 MG tablet Take 1,000 mg by mouth as needed for mild pain.    [name] apixaban (ELIQUIS) 5 MG TABS tablet Take 1 tablet (5 mg total) by mouth 2 (two) times daily. 180 tablet 3  . aspirin EC 81 MG tablet Take 1 tablet (81 mg total) by mouth daily. 90 tablet 3  . atorvastatin (LIPITOR) 80 MG tablet Take 1 tablet (80 mg total) by mouth daily. 30 tablet 11  . digoxin (LANOXIN) 0.25 MG tablet Take 1 tablet (0.25 mg total) by mouth daily. 90 tablet 3  . diltiazem (CARDIZEM CD) 120 MG 24 hr capsule Take 1 capsule (120 mg total) by mouth daily. 90 capsule 3  . furosemide (LASIX) 40 MG tablet Take 1 tablet (40 mg total) by mouth daily. 90 tablet 2  . metoprolol succinate (TOPROL-XL) 100 MG 24 hr tablet Take 1 tablet (100 mg total) by mouth 2 (two) times daily. Take with or immediately following a meal. 180 tablet 3  . Multiple Vitamin (MULTIVITAMIN WITH MINERALS) TABS tablet Take 1 tablet by mouth daily.    . nitroGLYCERIN (NITROSTAT) 0.4 MG SL tablet PLEASE SEE ATTACHED FOR DETAILED DIRECTIONS 25 tablet 6  . sacubitril-valsartan (ENTRESTO) 49-51 MG Take 1 tablet by mouth 2 (two) times daily. 180 tablet 3  . triamcinolone cream (KENALOG) 0.1 % Apply 1 application topically at  bedtime.     No current facility-administered medications for this visit.     Allergies:   Other   Social History: Social History   Socioeconomic History  . Marital status: Married    Spouse name: Not on file  . Number of children: Not on file  . Years of education: Not on file  . Highest education level: Not on file  Occupational History  . Not on file  Social Needs  . Financial resource strain: Not on file  . Food insecurity:    Worry: Not on file    Inability: Not on file  .  Transportation needs:    Medical: Not on file    Non-medical: Not on file  Tobacco Use  . Smoking status: Former Smoker    Packs/day: 1.00    Years: 30.00    Pack years: 30.00    Types: Cigarettes  . Smokeless tobacco: Never Used  . Tobacco comment: "quit smoking cigarettes in ~ 2007"  Substance and Sexual Activity  . Alcohol use: Yes    Alcohol/week: 1.0 standard drinks    Types: 1 Cans of beer per week    Comment: occ  . Drug use: No  . Sexual activity: Not Currently  Lifestyle  . Physical activity:    Days per week: Not on file    Minutes per session: Not on file  . Stress: Not on file  Relationships  . Social connections:    Talks on phone: Not on file    Gets together: Not on file    Attends religious service: Not on file    Active member of club or organization: Not on file    Attends meetings of clubs or organizations: Not on file    Relationship status: Not on file  . Intimate partner violence:    Fear of current or ex partner: Not on file    Emotionally abused: Not on file    Physically abused: Not on file    Forced sexual activity: Not on file  Other Topics Concern  . Not on file  Social History Narrative  . Not on file    Family History: Family History  Problem Relation Age of Onset  . Hypertension Father   . Stroke Sister   . Hypertension Sister   . Hypertension Brother   . Heart attack Neg Hx     Review of Systems: All other systems reviewed and are otherwise negative except as noted above.   Physical Exam: VS:  There were no vitals taken for this visit. , BMI There is no height or weight on file to calculate BMI. Wt Readings from Last 3 Encounters:  12/28/17 152 lb (68.9 kg)  12/20/17 150 lb 1.9 oz (68.1 kg)  12/18/17 156 lb 1.4 oz (70.8 kg)    GEN- The patient is well appearing, alert and oriented x 3 today.   HEENT: normocephalic, atraumatic; sclera clear, conjunctiva pink; hearing intact; oropharynx clear; neck supple, no JVP Lymph-  no cervical lymphadenopathy Lungs- Clear to ausculation bilaterally, normal work of breathing.  No wheezes, rales, rhonchi Heart- Regular rate and rhythm, no murmurs, rubs or gallops, PMI not laterally displaced GI- soft, non-tender, non-distended, bowel sounds present, no hepatosplenomegaly Extremities- no clubbing, cyanosis, or edema; DP/PT/radial pulses 2+ bilaterally MS- no significant deformity or atrophy Skin- warm and dry, no rash or lesion  Psych- euthymic mood, full affect Neuro- strength and sensation are intact   EKG:  EKG is ordered today. The ekg ordered  today shows ***  Recent Labs: 08/24/2017: ALT 17 12/16/2017: B Natriuretic Peptide 1,457.4 12/18/2017: BUN 13; Creatinine, Ser 1.08; Hemoglobin 14.1; Platelets 269; Potassium 4.4; Sodium 144    Other studies Reviewed: Additional studies/ records that were reviewed today include: Dr Jenel Lucks office notes  Assessment and Plan:  1.  Typical atrial flutter Maintaining SR Continue Eliquis for CHADS2VASC of 4 No bleeding issues She continue to decline ablation  2.  CAD/ICM/chronic systolic heart failure Euvolemic on exam Continue current therapies Consider updating echo in 3 months to reassess EF  3.  S/p MV repair Mild to moderate MS by TEE during admission 11/2017 Follow up with Dr Katrinka Blazing scheduled    Current medicines are reviewed at length with the patient today.   The patient does not have concerns regarding her medicines.  The following changes were made today:  none  Labs/ tests ordered today include: none No orders of the defined types were placed in this encounter.    Disposition:   Follow up with me in 6 months     Signed, Gypsy Balsam, NP 03/27/2018 9:58 AM   [facility] [address] [place] [address] [phone] (office) [phone] (fax)

## 2018-03-28 ENCOUNTER — Other Ambulatory Visit: Payer: Self-pay | Admitting: Interventional Cardiology

## 2018-03-28 DIAGNOSIS — I739 Peripheral vascular disease, unspecified: Secondary | ICD-10-CM

## 2018-03-30 ENCOUNTER — Ambulatory Visit: Payer: 59 | Admitting: Nurse Practitioner

## 2018-03-31 ENCOUNTER — Other Ambulatory Visit: Payer: Self-pay | Admitting: Interventional Cardiology

## 2018-04-11 ENCOUNTER — Ambulatory Visit (HOSPITAL_COMMUNITY)
Admission: RE | Admit: 2018-04-11 | Discharge: 2018-04-11 | Disposition: A | Discharge status: Home / Self Care | Payer: 59 | Source: Ambulatory Visit | Attending: Internal Medicine | Admitting: Internal Medicine

## 2018-04-11 ENCOUNTER — Other Ambulatory Visit: Payer: Self-pay | Admitting: Interventional Cardiology

## 2018-04-11 DIAGNOSIS — I739 Peripheral vascular disease, unspecified: Secondary | ICD-10-CM | POA: Diagnosis not present

## 2018-04-17 NOTE — Progress Notes (Deleted)
Cardiology Office Note:    Date:  04/17/2018   ID:  [name], DOB 04-06-1961, MRN [id number]  PCP:  Patient, No Pcp Per  Cardiologist:  Lesleigh Noe, MD   Referring MD: No ref. provider found   No chief complaint on file. ***  History of Present Illness:    [name] is a 57 y.o. female with a hx of CAD, prior LAD Promus DES 2003 and ultimately CABG with MV repair in 2007 for CHF related to multivessel CAD and MR. SVG to RCA DES 09/2015. Most recent LVEF 35-40%.  Recently diagnosed with claudication.   Past Medical History:  Diagnosis Date  . Atrial fibrillation (HCC)   . Atrial flutter (HCC)   . CHF (congestive heart failure) (HCC)   . Coronary atherosclerosis of native coronary artery   . HTN (hypertension)   . Hx of CABG    LIMA-LAD, SVG-DIAG, SVG-OM, SVG-RCA  . Hypercholesteremia   . Myocardial infarct (HCC) 2007  . NSTEMI (non-ST elevated myocardial infarction) (HCC) 10/13/2015  . Obesity     Past Surgical History:  Procedure Laterality Date  . ABDOMINAL HYSTERECTOMY    . CARDIAC CATHETERIZATION N/A 10/14/2015   Procedure: Left Heart Cath and Cors/Grafts Angiography;  Surgeon: Marykay Lex, MD;  Location: [facility] INVASIVE CV LAB;  Service: Cardiovascular;  Laterality: N/A;  . CARDIAC CATHETERIZATION N/A 10/14/2015   Procedure: Coronary Stent Intervention;  Surgeon: Marykay Lex, MD;  Location: [facility] INVASIVE CV LAB;  Service: Cardiovascular;  Laterality: N/A;  . CESAREAN SECTION  1985  . CORONARY ANGIOPLASTY WITH STENT PLACEMENT  2003   LAD Promus DES [name] 10/13/2015  . CORONARY ARTERY BYPASS GRAFT  03/2006   "CABG X3"  . LEFT HEART CATH AND CORS/GRAFTS ANGIOGRAPHY N/A 12/09/2017   Procedure: LEFT HEART CATH AND CORS/GRAFTS ANGIOGRAPHY;  Surgeon: Marykay Lex, MD;  Location: [facility] INVASIVE CV LAB;  Service: Cardiovascular;  Laterality: N/A;  . MITRAL VALVE REPAIR     [name] 10/13/2015  . TEE WITHOUT CARDIOVERSION N/A 12/07/2017   Procedure: TRANSESOPHAGEAL  ECHOCARDIOGRAM (TEE);  Surgeon: Chrystie Nose, MD;  Location: [facility] ENDOSCOPY;  Service: Cardiovascular;  Laterality: N/A;    Current Medications: No outpatient medications have been marked as taking for the 04/18/18 encounter (Appointment) with Lyn Records, MD.     Allergies:   Other   Social History   Socioeconomic History  . Marital status: Married    Spouse name: Not on file  . Number of children: Not on file  . Years of education: Not on file  . Highest education level: Not on file  Occupational History  . Not on file  Social Needs  . Financial resource strain: Not on file  . Food insecurity:    Worry: Not on file    Inability: Not on file  . Transportation needs:    Medical: Not on file    Non-medical: Not on file  Tobacco Use  . Smoking status: Former Smoker    Packs/day: 1.00    Years: 30.00    Pack years: 30.00    Types: Cigarettes  . Smokeless tobacco: Never Used  . Tobacco comment: "quit smoking cigarettes in ~ 2007"  Substance and Sexual Activity  . Alcohol use: Yes    Alcohol/week: 1.0 standard drinks    Types: 1 Cans of beer per week    Comment: occ  . Drug use: No  . Sexual activity: Not Currently  Lifestyle  . Physical activity:  Days per week: Not on file    Minutes per session: Not on file  . Stress: Not on file  Relationships  . Social connections:    Talks on phone: Not on file    Gets together: Not on file    Attends religious service: Not on file    Active member of club or organization: Not on file    Attends meetings of clubs or organizations: Not on file    Relationship status: Not on file  Other Topics Concern  . Not on file  Social History Narrative  . Not on file     Family History: The patient's ***family history includes Hypertension in her brother, father, and sister; Stroke in her sister. There is no history of Heart attack.  ROS:   Please see the history of present illness.    *** All other systems reviewed and are  negative.  EKGs/Labs/Other Studies Reviewed:    The following studies were reviewed today: ***  EKG:  EKG is *** ordered today.  The ekg ordered today demonstrates ***  Recent Labs: 08/24/2017: ALT 17 12/16/2017: B Natriuretic Peptide 1,457.4 12/18/2017: BUN 13; Creatinine, Ser 1.08; Hemoglobin 14.1; Platelets 269; Potassium 4.4; Sodium 144  Recent Lipid Panel    Component Value Date/Time   CHOL 123 12/04/2017 1536   CHOL 191 08/24/2017 1536   TRIG 89 12/04/2017 1536   HDL 32 (L) 12/04/2017 1536   HDL 58 08/24/2017 1536   CHOLHDL 3.8 12/04/2017 1536   VLDL 18 12/04/2017 1536   LDLCALC 73 12/04/2017 1536   LDLCALC 96 08/24/2017 1536    Physical Exam:    VS:  There were no vitals taken for this visit.    Wt Readings from Last 3 Encounters:  12/28/17 152 lb (68.9 kg)  12/20/17 150 lb 1.9 oz (68.1 kg)  12/18/17 156 lb 1.4 oz (70.8 kg)     GEN: *** Well nourished, well developed in no acute distress HEENT: Normal NECK: No JVD. LYMPHATICS: No lymphadenopathy CARDIAC: ***RRR, ***murmur, ***gallop, *** edema. VASCULAR: *** pulses. *** bruits. RESPIRATORY:  Clear to auscultation without rales, wheezing or rhonchi  ABDOMEN: Soft, non-tender, non-distended, No pulsatile mass, MUSCULOSKELETAL: No deformity  SKIN: Warm and dry NEUROLOGIC:  Alert and oriented x 3 PSYCHIATRIC:  Normal affect   ASSESSMENT:    1. Chronic systolic dysfunction of left ventricle   2. Typical atrial flutter (HCC)   3. Essential hypertension   4. Coronary artery disease involving coronary bypass graft of native heart with angina pectoris (HCC)   5. Hypercholesteremia   6. Intermittent claudication (HCC)    PLAN:    In order of problems listed above:  1. ***   Medication Adjustments/Labs and Tests Ordered: Current medicines are reviewed at length with the patient today.  Concerns regarding medicines are outlined above.  No orders of the defined types were placed in this encounter.  No orders  of the defined types were placed in this encounter.   There are no Patient Instructions on file for this visit.   Signed, Lesleigh Noe, MD  04/17/2018 6:11 PM    [facility]

## 2018-04-18 ENCOUNTER — Ambulatory Visit: Payer: 59 | Admitting: Interventional Cardiology

## 2018-04-24 ENCOUNTER — Ambulatory Visit (HOSPITAL_COMMUNITY)
Admission: RE | Admit: 2018-04-24 | Discharge: 2018-04-24 | Disposition: A | Discharge status: Home / Self Care | Payer: 59 | Source: Ambulatory Visit | Attending: Cardiology | Admitting: Cardiology

## 2018-04-24 DIAGNOSIS — I739 Peripheral vascular disease, unspecified: Secondary | ICD-10-CM | POA: Diagnosis not present

## 2018-05-03 ENCOUNTER — Telehealth: Payer: Self-pay | Admitting: Interventional Cardiology

## 2018-05-03 NOTE — Telephone Encounter (Signed)
Called patient who said that she is doing fine and was returning Jennifer's call.  She is able to await her call on 10/17.

## 2018-05-03 NOTE — Telephone Encounter (Signed)
Follow Up:    Pt says she had been missing Jennifer'Bower's calls. She was retuning her calls.

## 2018-05-04 NOTE — Telephone Encounter (Signed)
Left message to call back  

## 2018-05-04 NOTE — Telephone Encounter (Signed)
Follow Up:; ° ° °Returning your call. °

## 2018-05-09 ENCOUNTER — Ambulatory Visit: Payer: 59 | Admitting: Interventional Cardiology

## 2018-05-12 NOTE — Telephone Encounter (Signed)
Left message to call back  Pt has lower extremity dopplers resulted.  Needs to see Dr. Allyson Sabal.

## 2018-06-13 ENCOUNTER — Ambulatory Visit: Payer: 59 | Admitting: Interventional Cardiology

## 2018-06-14 NOTE — Telephone Encounter (Signed)
Left message to call back  

## 2018-07-10 ENCOUNTER — Encounter: Payer: Self-pay | Admitting: *Deleted

## 2018-07-13 NOTE — Telephone Encounter (Signed)
Letter mailed 12/23

## 2018-07-14 ENCOUNTER — Ambulatory Visit: Payer: 59 | Admitting: Cardiology

## 2018-08-18 ENCOUNTER — Encounter: Payer: Self-pay | Admitting: *Deleted

## 2018-08-18 NOTE — Telephone Encounter (Signed)
Attempted to call pt and message states number currently not accepting calls at this time. Will send another letter and release to MyChart

## 2018-08-25 ENCOUNTER — Ambulatory Visit: Payer: 59 | Admitting: Interventional Cardiology

## 2018-08-31 ENCOUNTER — Telehealth: Payer: Self-pay | Admitting: Interventional Cardiology

## 2018-08-31 NOTE — Telephone Encounter (Signed)
  Patient received a letter to call us for her test results

## 2018-08-31 NOTE — Telephone Encounter (Signed)
Pt called back and was sent a message (See closed encounter from 08/31/2018).  I called pt back and left message.

## 2018-08-31 NOTE — Telephone Encounter (Signed)
Please see phone note from 05/03/18

## 2018-09-14 ENCOUNTER — Ambulatory Visit: Payer: 59 | Admitting: Interventional Cardiology

## 2018-09-18 ENCOUNTER — Other Ambulatory Visit: Payer: Self-pay

## 2018-09-18 MED ORDER — FUROSEMIDE 40 MG PO TABS: 40.0000 mg | ORAL_TABLET | Freq: Every day | ORAL | 1 refills | Status: DC

## 2018-10-05 NOTE — Telephone Encounter (Signed)
Spoke with pt and went over results and recommendations.  Pt in agreement to see Dr. Allyson Sabal for [facility] consult.  Scheduled pt to be seen 11/28/2018.  Pt aware this is subject to change based on COVID virus.  Pt appreciative for call.

## 2018-10-31 ENCOUNTER — Telehealth: Payer: Self-pay | Admitting: Interventional Cardiology

## 2018-10-31 ENCOUNTER — Other Ambulatory Visit: Payer: Self-pay | Admitting: Interventional Cardiology

## 2018-10-31 NOTE — Telephone Encounter (Signed)
Left message to call back Attempted to contact pt about appt with Dr. Katrinka Blazing scheduled for 4/22.  Pt needs to be changed to a virtual visit at 9 or 930A, whichever is available, or can move back to the 23rd.

## 2018-11-01 NOTE — Telephone Encounter (Signed)
Spoke with pt this morning.  She denies any cardiac issues and prefers to wait and see Dr. Katrinka Blazing in person.  Scheduled appt for August.  Advised to call sooner if any issues.

## 2018-11-08 ENCOUNTER — Ambulatory Visit: Payer: Self-pay | Admitting: Interventional Cardiology

## 2018-11-28 ENCOUNTER — Institutional Professional Consult (permissible substitution): Payer: Self-pay | Admitting: Cardiovascular Disease

## 2018-12-22 ENCOUNTER — Other Ambulatory Visit: Payer: Self-pay

## 2018-12-22 MED ORDER — SACUBITRIL-VALSARTAN 49-51 MG PO TABS: 1.0000 | ORAL_TABLET | Freq: Two times a day (BID) | ORAL | 3 refills | Status: DC

## 2018-12-22 MED ORDER — APIXABAN 5 MG PO TABS: 5.0000 mg | ORAL_TABLET | Freq: Two times a day (BID) | ORAL | 1 refills | Status: DC

## 2018-12-22 MED ORDER — DILTIAZEM HCL ER COATED BEADS 120 MG PO CP24: 120.0000 mg | ORAL_CAPSULE | Freq: Every day | ORAL | 3 refills | Status: DC

## 2018-12-23 ENCOUNTER — Other Ambulatory Visit: Payer: Self-pay | Admitting: Nurse Practitioner

## 2018-12-25 ENCOUNTER — Other Ambulatory Visit: Payer: Self-pay

## 2018-12-26 MED ORDER — DIGOXIN 250 MCG PO TABS: 0.2500 mg | ORAL_TABLET | Freq: Every day | ORAL | 0 refills | Status: DC

## 2018-12-26 MED ORDER — METOPROLOL SUCCINATE ER 100 MG PO TB24: 100.0000 mg | ORAL_TABLET | Freq: Two times a day (BID) | ORAL | 0 refills | Status: DC

## 2019-01-18 ENCOUNTER — Other Ambulatory Visit: Payer: Self-pay | Admitting: Interventional Cardiology

## 2019-02-12 ENCOUNTER — Telehealth: Payer: Self-pay

## 2019-02-12 NOTE — Telephone Encounter (Signed)
**Note De-Identified Dynastie Knoop Obfuscation** I have started a Entresto PA through covermymeds. Key: [id number]

## 2019-02-12 NOTE — Telephone Encounter (Signed)
**Note De-Identified Narcissus Detwiler Obfuscation** Letter received from [employer] stating that they have approved the pts Entresto PA. Approval good until 02/12/2020 Case#: [id number]  The pts pharmacy is aware.

## 2019-03-04 NOTE — Progress Notes (Deleted)
Cardiology Office Note:    Date:  03/04/2019   ID:  [name], DOB 1960-09-10, MRN [id number]  PCP:  Patient, No Pcp Per  Cardiologist:  Sinclair Grooms, MD   Referring MD: No ref. provider found   No chief complaint on file.   History of Present Illness:    [name] is a 58 y.o. female with a hx of h/o CAD, prior LAD Promus DES 2003 and ultimately CABG with MV repair in 2007 for CHF related to multivessel CAD and MR.SVG to RCA DES 09/2015. Most recent LVEF 35-40%. Developed A Flutter, subsequent CHF, refused ablation after had spontaneous conversion.  ***   Past Medical History:  Diagnosis Date  . Atrial fibrillation ([place])   . Atrial flutter ([place])   . CHF (congestive heart failure) ([place])   . Coronary atherosclerosis of native coronary artery   . HTN (hypertension)   . Hx of CABG    LIMA-LAD, SVG-DIAG, SVG-OM, SVG-RCA  . Hypercholesteremia   . Myocardial infarct ([place]) 2007  . NSTEMI (non-ST elevated myocardial infarction) ([place]) 10/13/2015  . Obesity     Past Surgical History:  Procedure Laterality Date  . ABDOMINAL HYSTERECTOMY    . CARDIAC CATHETERIZATION N/A 10/14/2015   Procedure: Left Heart Cath and Cors/Grafts Angiography;  Surgeon: Leonie Man, MD;  Location: [place] CV LAB;  Service: Cardiovascular;  Laterality: N/A;  . CARDIAC CATHETERIZATION N/A 10/14/2015   Procedure: Coronary Stent Intervention;  Surgeon: Leonie Man, MD;  Location: [place] CV LAB;  Service: Cardiovascular;  Laterality: N/A;  . CESAREAN SECTION  1985  . CORONARY ANGIOPLASTY WITH STENT PLACEMENT  2003   LAD Promus DES Archie Endo 10/13/2015  . CORONARY ARTERY BYPASS GRAFT  03/2006   "CABG X3"  . LEFT HEART CATH AND CORS/GRAFTS ANGIOGRAPHY N/A 12/09/2017   Procedure: LEFT HEART CATH AND CORS/GRAFTS ANGIOGRAPHY;  Surgeon: Leonie Man, MD;  Location: [place] CV LAB;  Service: Cardiovascular;  Laterality: N/A;  . MITRAL VALVE REPAIR     Archie Endo 10/13/2015  . TEE WITHOUT  CARDIOVERSION N/A 12/07/2017   Procedure: TRANSESOPHAGEAL ECHOCARDIOGRAM (TEE);  Surgeon: Pixie Casino, MD;  Location: [facility] ENDOSCOPY;  Service: Cardiovascular;  Laterality: N/A;    Current Medications: No outpatient medications have been marked as taking for the 03/07/19 encounter (Appointment) with Belva Crome, MD.     Allergies:   Other   Social History   Socioeconomic History  . Marital status: Married    Spouse name: Not on file  . Number of children: Not on file  . Years of education: Not on file  . Highest education level: Not on file  Occupational History  . Not on file  Social Needs  . Financial resource strain: Not on file  . Food insecurity    Worry: Not on file    Inability: Not on file  . Transportation needs    Medical: Not on file    Non-medical: Not on file  Tobacco Use  . Smoking status: Former Smoker    Packs/day: 1.00    Years: 30.00    Pack years: 30.00    Types: Cigarettes  . Smokeless tobacco: Never Used  . Tobacco comment: "quit smoking cigarettes in ~ 2007"  Substance and Sexual Activity  . Alcohol use: Yes    Alcohol/week: 1.0 standard drinks    Types: 1 Cans of beer per week    Comment: occ  . Drug use: No  . Sexual activity: Not  Currently  Lifestyle  . Physical activity    Days per week: Not on file    Minutes per session: Not on file  . Stress: Not on file  Relationships  . Social Musicianconnections    Talks on phone: Not on file    Gets together: Not on file    Attends religious service: Not on file    Active member of club or organization: Not on file    Attends meetings of clubs or organizations: Not on file    Relationship status: Not on file  Other Topics Concern  . Not on file  Social History Narrative  . Not on file     Family History: The patient's family history includes Hypertension in her brother, father, and sister; Stroke in her sister. There is no history of Heart attack.  ROS:   Please see the history of present  illness.    *** All other systems reviewed and are negative.  EKGs/Labs/Other Studies Reviewed:    The following studies were reviewed today: ***  EKG:  EKG ***  Recent Labs: No results found for requested labs within last 8760 hours.  Recent Lipid Panel    Component Value Date/Time   CHOL 123 12/04/2017 1536   CHOL 191 08/24/2017 1536   TRIG 89 12/04/2017 1536   HDL 32 (L) 12/04/2017 1536   HDL 58 08/24/2017 1536   CHOLHDL 3.8 12/04/2017 1536   VLDL 18 12/04/2017 1536   LDLCALC 73 12/04/2017 1536   LDLCALC 96 08/24/2017 1536    Physical Exam:    VS:  There were no vitals taken for this visit.    Wt Readings from Last 3 Encounters:  12/28/17 152 lb (68.9 kg)  12/20/17 150 lb 1.9 oz (68.1 kg)  12/18/17 156 lb 1.4 oz (70.8 kg)     GEN: ***. No acute distress HEENT: Normal NECK: No JVD. LYMPHATICS: No lymphadenopathy CARDIAC: *** RRR without murmur, gallop, or edema. VASCULAR: *** Normal Pulses. No bruits. RESPIRATORY:  Clear to auscultation without rales, wheezing or rhonchi  ABDOMEN: Soft, non-tender, non-distended, No pulsatile mass, MUSCULOSKELETAL: No deformity  SKIN: Warm and dry NEUROLOGIC:  Alert and oriented x 3 PSYCHIATRIC:  Normal affect   ASSESSMENT:    1. Chronic systolic dysfunction of left ventricle   2. Paroxysmal atrial fibrillation (HCC)   3. Coronary artery disease involving coronary bypass graft of native heart with angina pectoris (HCC)   4. Essential hypertension   5. Typical atrial flutter (HCC)   6. Intermittent claudication (HCC)   7. Educated About Covid-19 Virus Infection    PLAN:    In order of problems listed above:  1. ***   Medication Adjustments/Labs and Tests Ordered: Current medicines are reviewed at length with the patient today.  Concerns regarding medicines are outlined above.  No orders of the defined types were placed in this encounter.  No orders of the defined types were placed in this encounter.   There  are no Patient Instructions on file for this visit.   Signed, Lesleigh NoeHenry W  III, MD  03/04/2019 4:32 PM    [facility]

## 2019-03-06 ENCOUNTER — Telehealth: Payer: Self-pay

## 2019-03-06 ENCOUNTER — Ambulatory Visit (INDEPENDENT_AMBULATORY_CARE_PROVIDER_SITE_OTHER): Payer: No Typology Code available for payment source | Admitting: Cardiovascular Disease

## 2019-03-06 ENCOUNTER — Other Ambulatory Visit: Payer: Self-pay

## 2019-03-06 ENCOUNTER — Encounter: Payer: Self-pay | Admitting: Cardiovascular Disease

## 2019-03-06 VITALS — BP 131/70 | HR 55 | Ht 62.5 in | Wt 157.8 lb

## 2019-03-06 DIAGNOSIS — Z008 Encounter for other general examination: Secondary | ICD-10-CM

## 2019-03-06 DIAGNOSIS — I739 Peripheral vascular disease, unspecified: Secondary | ICD-10-CM | POA: Insufficient documentation

## 2019-03-06 NOTE — Assessment & Plan Note (Signed)
[name] was referred to me by Dr. Tamala Julian for evaluation treatment of symptomatic PAD.  She has a history of CAD and multiple other risk factors.  She has had accelerated claudication for many years which she describes as classic claudication symptoms.  She has to stop when she walks in a supermarket shopping.  Her left lower extremity Doppler studies were performed a year ago revealing a right ABI 0.65 and a left 0.82 with high-frequency signals in both proximal SFAs.  She wishes to proceed with endovascular therapy.  We will schedule this in the next several weeks.  She will need to stop her Eliquis 2 days prior to the procedure.

## 2019-03-06 NOTE — Patient Instructions (Signed)
[place] [address] Dept: [phone] Loc: [phone]  [name]  03/06/2019  You are to be scheduled for a Peripheral Angiogram on Thursday, August 27 with Dr. Quay Thomas. Dr. Kennon Thomas does these procedures on Monday and Thursday. If this proposed date will not work for you, please contact our office to discuss changing the procedure date.  1. Please arrive at the [facility] (Main Entrance A) at [facility]: [address] [place], [place] [address] at [place] (This time is two hours before your procedure to ensure your preparation). Free valet parking service is available.   Special note: Every effort is made to have your procedure done on time. Please understand that emergencies sometimes delay scheduled procedures.  2. Diet: Do not eat solid foods after midnight.  The patient may have clear liquids until 5am upon the day of the procedure.  3. Labs:  You will need to have blood drawn  Go to:  [facility] at [facility] [address], [place], [place] [address] FOR YOUR BASIC METABOLIC PANEL, COMPLETE BLOOD COUNT, AND THYROID STIMULATING HORMONE LAB WORK. NO APPOINTMENT IS NEEDED. YOU MUST HAVE THIS LAB WORK DONE BEFORE GOING TO GET YOUR COVID-19 TEST DONE BECAUSE YOU WILL NEED TO SELF-ISOLATE AFTER THE COVID-19 TEST UNTIL THE DAY OF YOUR [place].  You will need a to get a COVID-19 test done. You will need an appointment. Your appointment date will depend on when you are able to have the procedure. Go to: [facility] Entrance [place], [place] [address] FOR YOUR COVID-19 TEST. YOU MUST HAVE YOUR COVID-19 TEST COMPLETED 3-4 DAYS PRIOR TO YOUR UPCOMING PROCEDURE. YOU WILL ALSO NEED TO SELF-ISOLATE AFTER THE COVID-19 TEST UNTIL THE DAY OF YOUR PROCEDURE/TEST. PLEASE BRING YOUR I.D. AND YOUR  INSURANCE CARD(S) WITH YOU.   4. Medication instructions in preparation for your procedure:  Do not take your furosemide (Lasix) on the morning of your procedure.  Stop taking your apixaban (Eliquis) 2 days prior to your procedure  On the morning of your procedure, take your Aspirin and any morning medicines NOT listed above.  You may use sips of water.  5. Plan for one night stay--bring personal belongings. 6. Bring a current list of your medications and current insurance cards. 7. You MUST have a responsible person to drive you home. 8. Someone MUST be with you the first 24 hours after you arrive home or your discharge will be delayed. 9. Please wear clothes that are easy to get on and off and wear slip-on shoes.  __________________________________________________________________   Post-procedure Testing: Your physician has requested that you have a lower or upper extremity arterial duplex. This test is an ultrasound of the arteries in the legs or arms. It looks at arterial blood flow in the legs and arms. Allow one hour for Lower and Upper Arterial scans. There are no restrictions or special instructions TO BE SCHEDULED FOR THE NEXT AVAILABLE APPOINTMENT AFTER YOUR PROCEDURE. YOU WILL BE CONTACTED BY A SCHEDULER TO SET UP THIS APPOINTMENT.  Your physician has requested that you have an ankle brachial index (ABI). During this test an ultrasound and blood pressure cuff are used to evaluate the arteries that supply the arms and legs with blood. Allow thirty minutes for this exam. There are no restrictions or special instructions. TO BE SCHEDULED FOR THE NEXT AVAILABLE APPOINTMENT AFTER  YOUR PROCEDURE. YOU WILL BE CONTACTED BY A SCHEDULER TO SET UP THIS APPOINTMENT.  Follow-Up: At [facility], you and your health needs are our priority.  As part of our continuing mission to provide you with exceptional heart care, we have created designated Provider Care Teams.  These Care Teams include  your primary Cardiologist (physician) and Advanced Practice Providers (APPs -  Physician Assistants and Nurse Practitioners) who all work together to provide you with the care you need, when you need it. You will need a follow up appointment with Dr. Nanetta BattyJonathan Thomas after your post-procedure ultrasounds. Please contact our office to set up this appointment.

## 2019-03-06 NOTE — Progress Notes (Signed)
03/06/2019 [name]   [birth year]  [id number]  Primary Physician Merrilee Seashore, MD Primary Cardiologist: Lorretta Harp MD Renae Gloss  HPI:  [name] is a 58 y.o. married African-American female mother of 23, grandmother of 1 grandchild who works as an Passenger transport manager for [employer].  She was referred by Dr. Daneen Schick for peripheral vascular evaluation because of lifestyle limiting claudication.  Risk factors include remote tobacco abuse having quit 13 to 14 years ago, treated hypertension hyperlipidemia.  There is no family history of heart disease.  She is never had a stroke but does have a history of ischemic heart disease status post CABG in 07 with non-STEMI 12/04/2017.  She had occluded native vessels with 2 patent vein grafts and moderately severe LV dysfunction.  She did have a flutter and had attempted TEE cardioversion however there was demonstration of left atrial thrombus this never occurred.  She was switched from Coumadin which she was on remotely to Eliquis.  She spontaneously converted to sinus rhythm.  She denies chest pain or shortness of breath.   Current Meds  Medication Sig  . acetaminophen (TYLENOL) 500 MG tablet Take 1,000 mg by mouth as needed for mild pain.  [name] apixaban (ELIQUIS) 5 MG TABS tablet Take 1 tablet (5 mg total) by mouth 2 (two) times daily.  [name] aspirin EC 81 MG tablet Take 1 tablet (81 mg total) by mouth daily.  [name] atorvastatin (LIPITOR) 80 MG tablet TAKE 1 TABLET EVERY DAY  . digoxin (LANOXIN) 0.25 MG tablet TAKE 1 TABLET BY MOUTH DAILY. PLEASE MAKE OVERDUE APPT WITH DR. Tamala Julian BEFORE ANYMORE RF 1ST ATTEMPT  . furosemide (LASIX) 40 MG tablet TAKE 1 TABLET (40 MG TOTAL) BY MOUTH DAILY. PT NEEDS TO KEEP UPCOMING APRIL APPT FOR FUTURE REFILLS  . metoprolol succinate (TOPROL-XL) 100 MG 24 hr tablet TAKE 1 TABLET (100 MG TOTAL) BY MOUTH 2 (TWO) TIMES DAILY.  . Multiple Vitamin (MULTIVITAMIN WITH MINERALS) TABS tablet Take 1  tablet by mouth daily.  . nitroGLYCERIN (NITROSTAT) 0.4 MG SL tablet PLEASE SEE ATTACHED FOR DETAILED DIRECTIONS  . sacubitril-valsartan (ENTRESTO) 49-51 MG Take 1 tablet by mouth 2 (two) times daily.  [name] triamcinolone cream (KENALOG) 0.1 % Apply 1 application topically at bedtime.     Allergies  Allergen Reactions  . Other Itching    GEL ON ELECTRODES FOR EKG    Social History   Socioeconomic History  . Marital status: Married    Spouse name: Not on file  . Number of children: Not on file  . Years of education: Not on file  . Highest education level: Not on file  Occupational History  . Not on file  Social Needs  . Financial resource strain: Not on file  . Food insecurity    Worry: Not on file    Inability: Not on file  . Transportation needs    Medical: Not on file    Non-medical: Not on file  Tobacco Use  . Smoking status: Former Smoker    Packs/day: 1.00    Years: 30.00    Pack years: 30.00    Types: Cigarettes  . Smokeless tobacco: Never Used  . Tobacco comment: "quit smoking cigarettes in ~ 2007"  Substance and Sexual Activity  . Alcohol use: Yes    Alcohol/week: 1.0 standard drinks    Types: 1 Cans of beer per week    Comment: occ  . Drug use: No  . Sexual activity:  Not Currently  Lifestyle  . Physical activity    Days per week: Not on file    Minutes per session: Not on file  . Stress: Not on file  Relationships  . Social Musicianconnections    Talks on phone: Not on file    Gets together: Not on file    Attends religious service: Not on file    Active member of club or organization: Not on file    Attends meetings of clubs or organizations: Not on file    Relationship status: Not on file  . Intimate partner violence    Fear of current or ex partner: Not on file    Emotionally abused: Not on file    Physically abused: Not on file    Forced sexual activity: Not on file  Other Topics Concern  . Not on file  Social History Narrative  . Not on file      Review of Systems: General: negative for chills, fever, night sweats or weight changes.  Cardiovascular: negative for chest pain, dyspnea on exertion, edema, orthopnea, palpitations, paroxysmal nocturnal dyspnea or shortness of breath Dermatological: negative for rash Respiratory: negative for cough or wheezing Urologic: negative for hematuria Abdominal: negative for nausea, vomiting, diarrhea, bright red blood per rectum, melena, or hematemesis Neurologic: negative for visual changes, syncope, or dizziness All other systems reviewed and are otherwise negative except as noted above.    Blood pressure 131/70, pulse (!) 55, height 5' 2.5" (1.588 m), weight 157 lb 12.8 oz (71.6 kg), SpO2 97 %.  General appearance: alert and no distress Neck: no adenopathy, no carotid bruit, no JVD, supple, symmetrical, trachea midline and thyroid not enlarged, symmetric, no tenderness/mass/nodules Lungs: clear to auscultation bilaterally Heart: regular rate and rhythm, S1, S2 normal, no murmur, click, rub or gallop Extremities: extremities normal, atraumatic, no cyanosis or edema Pulses: 2+ and symmetric Skin: Skin color, texture, turgor normal. No rashes or lesions Neurologic: Alert and oriented X 3, normal strength and tone. Normal symmetric reflexes. Normal coordination and gait  EKG sinus bradycardia 55 with evidence of left ventricular hypertrophy with repolarization changes, anterolateral T wave inversion.  I personally reviewed this EKG.  ASSESSMENT AND PLAN:   Peripheral vascular disease, unspecified (HCC) Ms. Plucinski was referred to me by Dr. Katrinka BlazingSmith for evaluation treatment of symptomatic PAD.  She has a history of CAD and multiple other risk factors.  She has had accelerated claudication for many years which she describes as classic claudication symptoms.  She has to stop when she walks in a supermarket shopping.  Her left lower extremity Doppler studies were performed a year ago revealing a right  ABI 0.65 and a left 0.82 with high-frequency signals in both proximal SFAs.  She wishes to proceed with endovascular therapy.  We will schedule this in the next several weeks.  She will need to stop her Eliquis 2 days prior to the procedure.      Runell GessJonathan J. Makiya Jeune MD FACP,FACC,FAHA, [facility] 03/06/2019 8:49 AM

## 2019-03-06 NOTE — Telephone Encounter (Signed)
8/18 AVS mailed to pt on 8/18 

## 2019-03-07 ENCOUNTER — Ambulatory Visit: Payer: Self-pay | Admitting: Interventional Cardiology

## 2019-03-12 ENCOUNTER — Telehealth: Payer: Self-pay | Admitting: Cardiovascular Disease

## 2019-03-12 NOTE — Telephone Encounter (Signed)
New message    Patient calling to reschedule procedure with Dr Gwenlyn Found. Patient states she received a letter stating surgery was scheduled for 8/27

## 2019-03-12 NOTE — Telephone Encounter (Signed)
Contacted pt. Unable to reach. LMTCB stating procedure date on AVS not official and pt can call office to inform Dr. Gwenlyn Found that she will not proceed with procedure or to figure out a date for procedure that will work for her. Advised on VM that Dr. Gwenlyn Found does procedures on a Mondays and Thursdays.

## 2019-04-02 ENCOUNTER — Other Ambulatory Visit: Payer: Self-pay | Admitting: Nurse Practitioner

## 2019-04-02 ENCOUNTER — Other Ambulatory Visit: Payer: Self-pay | Admitting: Interventional Cardiology

## 2019-05-01 ENCOUNTER — Other Ambulatory Visit: Payer: Self-pay | Admitting: Interventional Cardiology

## 2019-05-10 ENCOUNTER — Other Ambulatory Visit: Payer: Self-pay | Admitting: Interventional Cardiology

## 2019-05-17 ENCOUNTER — Other Ambulatory Visit: Payer: Self-pay | Admitting: Interventional Cardiology

## 2019-05-23 NOTE — Progress Notes (Signed)
Cardiology Office Note:    Date:  05/25/2019   ID:  [name], DOB Aug 13, 1960, MRN [id number]  PCP:  Merrilee Seashore, MD  Cardiologist:  Sinclair Grooms, MD   Referring MD: Merrilee Seashore, MD   Chief Complaint  Patient presents with  . Coronary Artery Disease  . Congestive Heart Failure    History of Present Illness:    [name] is a 58 y.o. female with a hx of CAD, prior LAD Promus DES 2003 and ultimately CABG with MV repair in 2007 for CHF related to multivessel CAD and MR.SVG to RCA DES 09/2015. Most recent LVEF 35-40%).  PAD involving left lower extremity.  [name] denies cardiac symptoms.  Specifically, she denies angina, dyspnea on exertion, orthopnea, lower extremity swelling, palpitations, and syncope.  No significant lower extremity swelling.  No lightheadedness or dizziness.  She does have discomfort in the left lower extremity when she walks.   Past Medical History:  Diagnosis Date  . Atrial fibrillation ([place])   . Atrial flutter ([place])   . CHF (congestive heart failure) ([place])   . Coronary atherosclerosis of native coronary artery   . HTN (hypertension)   . Hx of CABG    LIMA-LAD, SVG-DIAG, SVG-OM, SVG-RCA  . Hypercholesteremia   . Myocardial infarct ([place]) 2007  . NSTEMI (non-ST elevated myocardial infarction) ([place]) 10/13/2015  . Obesity     Past Surgical History:  Procedure Laterality Date  . ABDOMINAL HYSTERECTOMY    . CARDIAC CATHETERIZATION N/A 10/14/2015   Procedure: Left Heart Cath and Cors/Grafts Angiography;  Surgeon: Leonie Man, MD;  Location: [place] [facility];  Service: Cardiovascular;  Laterality: N/A;  . CARDIAC CATHETERIZATION N/A 10/14/2015   Procedure: Coronary Stent Intervention;  Surgeon: Leonie Man, MD;  Location: [place] [facility];  Service: Cardiovascular;  Laterality: N/A;  . CESAREAN SECTION  1985  . CORONARY ANGIOPLASTY WITH STENT PLACEMENT  2003   LAD Promus DES Archie Endo 10/13/2015  . CORONARY ARTERY BYPASS GRAFT   03/2006   "CABG X3"  . LEFT HEART CATH AND CORS/GRAFTS ANGIOGRAPHY N/A 12/09/2017   Procedure: LEFT HEART CATH AND CORS/GRAFTS ANGIOGRAPHY;  Surgeon: Leonie Man, MD;  Location: [place] [facility];  Service: Cardiovascular;  Laterality: N/A;  . MITRAL VALVE REPAIR     Archie Endo 10/13/2015  . TEE WITHOUT CARDIOVERSION N/A 12/07/2017   Procedure: TRANSESOPHAGEAL ECHOCARDIOGRAM (TEE);  Surgeon: Pixie Casino, MD;  Location: [facility];  Service: Cardiovascular;  Laterality: N/A;    Current Medications: Current Meds  Medication Sig  . acetaminophen (TYLENOL) 500 MG tablet Take 1,000 mg by mouth as needed for mild pain.  [name] apixaban (ELIQUIS) 5 MG TABS tablet Take 1 tablet (5 mg total) by mouth 2 (two) times daily.  [name] aspirin EC 81 MG tablet Take 1 tablet (81 mg total) by mouth daily.  [name] atorvastatin (LIPITOR) 80 MG tablet TAKE 1 TABLET BY MOUTH EVERY DAY  . digoxin (LANOXIN) 0.25 MG tablet TAKE 1 TABLET BY MOUTH DAILY. PLEASE MAKE OVERDUE APPT WITH DR. Tamala Julian BEFORE ANYMORE RF 1ST ATTEMPT  . furosemide (LASIX) 40 MG tablet TAKE 1 TABLET (40 MG TOTAL) BY MOUTH DAILY. PT NEEDS TO KEEP UPCOMING APRIL APPT FOR FUTURE REFILLS  . metoprolol succinate (TOPROL-XL) 100 MG 24 hr tablet TAKE 1 TABLET (100 MG TOTAL) BY MOUTH 2 (TWO) TIMES DAILY.  . Multiple Vitamin (MULTIVITAMIN WITH MINERALS) TABS tablet Take 1 tablet by mouth daily.  . nitroGLYCERIN (NITROSTAT) 0.4 MG SL tablet  PLEASE SEE ATTACHED FOR DETAILED DIRECTIONS  . sacubitril-valsartan (ENTRESTO) 49-51 MG Take 1 tablet by mouth 2 (two) times daily.  [name] triamcinolone cream (KENALOG) 0.1 % Apply 1 application topically at bedtime.     Allergies:   Other   Social History   Socioeconomic History  . Marital status: Married    Spouse name: Not on file  . Number of children: Not on file  . Years of education: Not on file  . Highest education level: Not on file  Occupational History  . Not on file  Social Needs  . Financial resource  strain: Not on file  . Food insecurity    Worry: Not on file    Inability: Not on file  . Transportation needs    Medical: Not on file    Non-medical: Not on file  Tobacco Use  . Smoking status: Former Smoker    Packs/day: 1.00    Years: 30.00    Pack years: 30.00    Types: Cigarettes  . Smokeless tobacco: Never Used  . Tobacco comment: "quit smoking cigarettes in ~ 2007"  Substance and Sexual Activity  . Alcohol use: Yes    Alcohol/week: 1.0 standard drinks    Types: 1 Cans of beer per week    Comment: occ  . Drug use: No  . Sexual activity: Not Currently  Lifestyle  . Physical activity    Days per week: Not on file    Minutes per session: Not on file  . Stress: Not on file  Relationships  . Social Musician on phone: Not on file    Gets together: Not on file    Attends religious service: Not on file    Active member of club or organization: Not on file    Attends meetings of clubs or organizations: Not on file    Relationship status: Not on file  Other Topics Concern  . Not on file  Social History Narrative  . Not on file     Family History: The patient's family history includes Hypertension in her brother, father, and sister; Stroke in her sister. There is no history of Heart attack.  ROS:   Please see the history of present illness.    She has been seeing Dr. Gery Pray because of PAD.  She still has claudication and has upcoming invasive evaluation with Dr. Gery Pray.  She has not had blood in her urine or stool.  No medication side effects.  All other systems reviewed and are negative.  EKGs/Labs/Other Studies Reviewed:    The following studies were reviewed today: No new data\  Lower Extremity Arterial Doppler investigation October 2019: Final Interpretation: Right: 50-74% stenosis noted in the CFA. Multiple 30-49% stenosis noted in the proximal SFA, distal to the ostium (high end range). 75-99% stenosis noted in the mid SFA. Three vessel run-off.   EKG:  EKG an EKG is not performed today.  On March 06, 2019 the EKG demonstrated bradycardia, long first-degree AV block, and nonspecific ST-T abnormality particularly in the precordial leads.  No acute changes noted compared to prior.  Recent Labs: No results found for requested labs within last 8760 hours.  Recent Lipid Panel    Component Value Date/Time   CHOL 123 12/04/2017 1536   CHOL 191 08/24/2017 1536   TRIG 89 12/04/2017 1536   HDL 32 (L) 12/04/2017 1536   HDL 58 08/24/2017 1536   CHOLHDL 3.8 12/04/2017 1536   VLDL 18 12/04/2017 1536  LDLCALC 73 12/04/2017 1536   LDLCALC 96 08/24/2017 1536    Physical Exam:    VS:  BP (!) 94/52   Pulse 78   Ht 5' 2.5" (1.588 m)   Wt 161 lb (73 kg)   SpO2 100%   BMI 28.98 kg/m     Wt Readings from Last 3 Encounters:  05/25/19 161 lb (73 kg)  03/06/19 157 lb 12.8 oz (71.6 kg)  12/28/17 152 lb (68.9 kg)     GEN: Appears older than stated age. No acute distress HEENT: Normal NECK: No JVD. LYMPHATICS: No lymphadenopathy CARDIAC:  RRR without murmur, gallop, or edema. VASCULAR: Bilateral diminished lower extremity pulses. No bruits. RESPIRATORY:  Clear to auscultation without rales, wheezing or rhonchi  ABDOMEN: Soft, non-tender, non-distended, No pulsatile mass, MUSCULOSKELETAL: No deformity  SKIN: Warm and dry NEUROLOGIC:  Alert and oriented x 3 PSYCHIATRIC:  Normal affect   ASSESSMENT:    1. Chronic systolic dysfunction of left ventricle   2. Coronary artery disease involving coronary bypass graft of native heart with angina pectoris (HCC)   3. Paroxysmal atrial fibrillation (HCC)   4. Claudication (HCC)   5. Essential hypertension   6. Educated about COVID-19 virus infection    PLAN:    In order of problems listed above:  1. Currently on good heart failure therapy.  This includes Entresto and Toprol-XL.  Also taking Lanoxin. 2. Secondary prevention discussed 3. Lanoxin is used for rate control when atrial  fibrillation occurs.  Given first-degree AV block noted in August, we should consider possibly discontinuing Lanoxin. 4. Being seen by Dr. Allyson Sabal and will have an upcoming invasive vascular evaluation.  Secondary prevention also discussed 5. Relatively low blood pressure.  Continue current medication regimen as the patient is asymptomatic. 6. 3W's were discussed and endorsed by the patient  Overall education and awareness concerning primary/secondary risk prevention was discussed in detail: LDL less than 70, hemoglobin A1c less than 7, blood pressure target less than 130/80 mmHg, >150 minutes of moderate aerobic activity per week, avoidance of smoking, weight control (via diet and exercise), and continued surveillance/management of/for obstructive sleep apnea.    Medication Adjustments/Labs and Tests Ordered: Current medicines are reviewed at length with the patient today.  Concerns regarding medicines are outlined above.  No orders of the defined types were placed in this encounter.  No orders of the defined types were placed in this encounter.   Patient Instructions  Medication Instructions:  Your physician recommends that you continue on your current medications as directed. Please refer to the Current Medication list given to you today.  *If you need a refill on your cardiac medications before your next appointment, please call your pharmacy*  Lab Work: None If you have labs (blood work) drawn today and your tests are completely normal, you will receive your results only by: [name] MyChart Message (if you have MyChart) OR . A paper copy in the mail If you have any lab test that is abnormal or we need to change your treatment, we will call you to review the results.  Testing/Procedures: None  Follow-Up: At [facility], you and your health needs are our priority.  As part of our continuing mission to provide you with exceptional heart care, we have created designated Provider Care  Teams.  These Care Teams include your primary Cardiologist (physician) and Advanced Practice Providers (APPs -  Physician Assistants and Nurse Practitioners) who all work together to provide you with the care you need, when  you need it.  Your next appointment:   9 months  The format for your next appointment:   In Person  Provider:   You may see Lesleigh NoeHenry W Smith III, MD or one of the following Advanced Practice Providers on your designated Care Team:    Norma FredricksonLori Gerhardt, NP  Nada BoozerLaura Ingold, NP  Georgie ChardJill McDaniel, NP   Other Instructions      Signed, Lesleigh NoeHenry W Smith III, MD  05/25/2019 9:40 AM    [facility]

## 2019-05-25 ENCOUNTER — Ambulatory Visit (INDEPENDENT_AMBULATORY_CARE_PROVIDER_SITE_OTHER): Payer: No Typology Code available for payment source | Admitting: Interventional Cardiology

## 2019-05-25 ENCOUNTER — Other Ambulatory Visit: Payer: Self-pay

## 2019-05-25 ENCOUNTER — Encounter: Payer: Self-pay | Admitting: Interventional Cardiology

## 2019-05-25 VITALS — BP 94/52 | HR 78 | Ht 62.5 in | Wt 161.0 lb

## 2019-05-25 DIAGNOSIS — I25709 Atherosclerosis of coronary artery bypass graft(s), unspecified, with unspecified angina pectoris: Secondary | ICD-10-CM | POA: Diagnosis not present

## 2019-05-25 DIAGNOSIS — I480 Paroxysmal atrial fibrillation: Secondary | ICD-10-CM | POA: Diagnosis not present

## 2019-05-25 DIAGNOSIS — Z7189 Other specified counseling: Secondary | ICD-10-CM

## 2019-05-25 DIAGNOSIS — I739 Peripheral vascular disease, unspecified: Secondary | ICD-10-CM | POA: Diagnosis not present

## 2019-05-25 DIAGNOSIS — I10 Essential (primary) hypertension: Secondary | ICD-10-CM

## 2019-05-25 DIAGNOSIS — I519 Heart disease, unspecified: Secondary | ICD-10-CM | POA: Diagnosis not present

## 2019-05-25 NOTE — Patient Instructions (Signed)
Medication Instructions:  Your physician recommends that you continue on your current medications as directed. Please refer to the Current Medication list given to you today.  *If you need a refill on your cardiac medications before your next appointment, please call your pharmacy*  Lab Work: None If you have labs (blood work) drawn today and your tests are completely normal, you will receive your results only by: . MyChart Message (if you have MyChart) OR . A paper copy in the mail If you have any lab test that is abnormal or we need to change your treatment, we will call you to review the results.  Testing/Procedures: None  Follow-Up: At [facility], you and your health needs are our priority.  As part of our continuing mission to provide you with exceptional heart care, we have created designated Provider Care Teams.  These Care Teams include your primary Cardiologist (physician) and Advanced Practice Providers (APPs -  Physician Assistants and Nurse Practitioners) who all work together to provide you with the care you need, when you need it.  Your next appointment:   9 month(s)  The format for your next appointment:   In Person  Provider:   You may see Henry W Smith III, MD or one of the following Advanced Practice Providers on your designated Care Team:    Lori Gerhardt, NP  Laura Ingold, NP  Jill McDaniel, NP   Other Instructions   

## 2019-06-17 ENCOUNTER — Other Ambulatory Visit: Payer: Self-pay | Admitting: Interventional Cardiology

## 2019-06-28 ENCOUNTER — Emergency Department (HOSPITAL_COMMUNITY): Payer: No Typology Code available for payment source

## 2019-06-28 ENCOUNTER — Emergency Department (HOSPITAL_COMMUNITY)
Admission: EM | Admit: 2019-06-28 | Discharge: 2019-06-29 | Disposition: A | Discharge status: Home / Self Care | Payer: No Typology Code available for payment source | Attending: Emergency Medicine | Admitting: Emergency Medicine

## 2019-06-28 DIAGNOSIS — R7989 Other specified abnormal findings of blood chemistry: Secondary | ICD-10-CM | POA: Insufficient documentation

## 2019-06-28 DIAGNOSIS — Z20828 Contact with and (suspected) exposure to other viral communicable diseases: Secondary | ICD-10-CM | POA: Insufficient documentation

## 2019-06-28 DIAGNOSIS — R110 Nausea: Secondary | ICD-10-CM | POA: Insufficient documentation

## 2019-06-28 DIAGNOSIS — Z87891 Personal history of nicotine dependence: Secondary | ICD-10-CM | POA: Diagnosis not present

## 2019-06-28 DIAGNOSIS — I2510 Atherosclerotic heart disease of native coronary artery without angina pectoris: Secondary | ICD-10-CM | POA: Diagnosis not present

## 2019-06-28 DIAGNOSIS — I4891 Unspecified atrial fibrillation: Secondary | ICD-10-CM | POA: Insufficient documentation

## 2019-06-28 DIAGNOSIS — Z79899 Other long term (current) drug therapy: Secondary | ICD-10-CM | POA: Diagnosis not present

## 2019-06-28 DIAGNOSIS — Z951 Presence of aortocoronary bypass graft: Secondary | ICD-10-CM | POA: Insufficient documentation

## 2019-06-28 DIAGNOSIS — R61 Generalized hyperhidrosis: Secondary | ICD-10-CM | POA: Insufficient documentation

## 2019-06-28 DIAGNOSIS — I252 Old myocardial infarction: Secondary | ICD-10-CM | POA: Diagnosis not present

## 2019-06-28 DIAGNOSIS — R55 Syncope and collapse: Secondary | ICD-10-CM | POA: Diagnosis not present

## 2019-06-28 DIAGNOSIS — Z7901 Long term (current) use of anticoagulants: Secondary | ICD-10-CM | POA: Insufficient documentation

## 2019-06-28 DIAGNOSIS — I110 Hypertensive heart disease with heart failure: Secondary | ICD-10-CM | POA: Diagnosis not present

## 2019-06-28 DIAGNOSIS — R531 Weakness: Secondary | ICD-10-CM | POA: Diagnosis present

## 2019-06-28 DIAGNOSIS — I5042 Chronic combined systolic (congestive) and diastolic (congestive) heart failure: Secondary | ICD-10-CM | POA: Diagnosis not present

## 2019-06-28 DIAGNOSIS — R778 Other specified abnormalities of plasma proteins: Secondary | ICD-10-CM

## 2019-06-28 LAB — CBG MONITORING, ED: Glucose-Capillary: 120 mg/dL — ABNORMAL HIGH (ref 70–99)

## 2019-06-28 NOTE — ED Triage Notes (Signed)
Patient is found on the floor laying down, stated she felt weak and wants to lay down. Pt is here w/ husband (being admitted). Offered patient a recline; pt continued to mumble feeling weak and dizzy; noted patient urinated on herself.

## 2019-06-29 ENCOUNTER — Other Ambulatory Visit: Payer: Self-pay

## 2019-06-29 ENCOUNTER — Other Ambulatory Visit: Payer: Self-pay | Admitting: Physician Assistant

## 2019-06-29 ENCOUNTER — Emergency Department (HOSPITAL_BASED_OUTPATIENT_CLINIC_OR_DEPARTMENT_OTHER): Payer: No Typology Code available for payment source

## 2019-06-29 DIAGNOSIS — I340 Nonrheumatic mitral (valve) insufficiency: Secondary | ICD-10-CM

## 2019-06-29 DIAGNOSIS — R55 Syncope and collapse: Secondary | ICD-10-CM | POA: Diagnosis present

## 2019-06-29 LAB — URINALYSIS, ROUTINE W REFLEX MICROSCOPIC
Bilirubin Urine: NEGATIVE
Glucose, UA: NEGATIVE mg/dL
Ketones, ur: NEGATIVE mg/dL
Leukocytes,Ua: NEGATIVE
Nitrite: NEGATIVE
Protein, ur: NEGATIVE mg/dL
Specific Gravity, Urine: 1.024 (ref 1.005–1.030)
pH: 5 (ref 5.0–8.0)

## 2019-06-29 LAB — SARS CORONAVIRUS 2 (TAT 6-24 HRS): SARS Coronavirus 2: NEGATIVE

## 2019-06-29 LAB — COMPREHENSIVE METABOLIC PANEL
ALT: 14 U/L (ref 0–44)
AST: 21 U/L (ref 15–41)
Albumin: 4.1 g/dL (ref 3.5–5.0)
Alkaline Phosphatase: 58 U/L (ref 38–126)
Anion gap: 11 (ref 5–15)
BUN: 16 mg/dL (ref 6–20)
CO2: 26 mmol/L (ref 22–32)
Calcium: 9.5 mg/dL (ref 8.9–10.3)
Chloride: 104 mmol/L (ref 98–111)
Creatinine, Ser: 1.29 mg/dL — ABNORMAL HIGH (ref 0.44–1.00)
GFR calc Af Amer: 52 mL/min — ABNORMAL LOW (ref >60)
GFR calc non Af Amer: 45 mL/min — ABNORMAL LOW (ref >60)
Glucose, Bld: 134 mg/dL — ABNORMAL HIGH (ref 70–99)
Potassium: 3.4 mmol/L — ABNORMAL LOW (ref 3.5–5.1)
Sodium: 141 mmol/L (ref 135–145)
Total Bilirubin: 0.8 mg/dL (ref 0.3–1.2)
Total Protein: 7.5 g/dL (ref 6.5–8.1)

## 2019-06-29 LAB — RAPID URINE DRUG SCREEN, HOSP PERFORMED
Amphetamines: NOT DETECTED
Barbiturates: NOT DETECTED
Benzodiazepines: NOT DETECTED
Cocaine: POSITIVE — AB
Opiates: NOT DETECTED
Tetrahydrocannabinol: NOT DETECTED

## 2019-06-29 LAB — CBC WITH DIFFERENTIAL/PLATELET
Abs Immature Granulocytes: 0.02 10*3/uL (ref 0.00–0.07)
Basophils Absolute: 0.1 10*3/uL (ref 0.0–0.1)
Basophils Relative: 1 %
Eosinophils Absolute: 0.1 10*3/uL (ref 0.0–0.5)
Eosinophils Relative: 2 %
HCT: 44.3 % (ref 36.0–46.0)
Hemoglobin: 14.3 g/dL (ref 12.0–15.0)
Immature Granulocytes: 0 %
Lymphocytes Relative: 41 %
Lymphs Abs: 2.7 10*3/uL (ref 0.7–4.0)
MCH: 30.8 pg (ref 26.0–34.0)
MCHC: 32.3 g/dL (ref 30.0–36.0)
MCV: 95.3 fL (ref 80.0–100.0)
Monocytes Absolute: 0.4 10*3/uL (ref 0.1–1.0)
Monocytes Relative: 6 %
Neutro Abs: 3.4 10*3/uL (ref 1.7–7.7)
Neutrophils Relative %: 50 %
Platelets: 201 10*3/uL (ref 150–400)
RBC: 4.65 MIL/uL (ref 3.87–5.11)
RDW: 13.7 % (ref 11.5–15.5)
WBC: 6.7 10*3/uL (ref 4.0–10.5)
nRBC: 0 % (ref 0.0–0.2)

## 2019-06-29 LAB — TROPONIN I (HIGH SENSITIVITY)
Troponin I (High Sensitivity): 238 ng/L (ref <18)
Troponin I (High Sensitivity): 241 ng/L (ref <18)

## 2019-06-29 LAB — HEPARIN LEVEL (UNFRACTIONATED)
Heparin Unfractionated: <0.1 IU/mL — ABNORMAL LOW (ref 0.30–0.70)
Heparin Unfractionated: <0.1 IU/mL — ABNORMAL LOW (ref 0.30–0.70)

## 2019-06-29 LAB — APTT: aPTT: 30 seconds (ref 24–36)

## 2019-06-29 LAB — ECHOCARDIOGRAM COMPLETE: Weight: 2320 oz

## 2019-06-29 LAB — LIPASE, BLOOD: Lipase: 34 U/L (ref 11–51)

## 2019-06-29 MED ORDER — APIXABAN 5 MG PO TABS
5.0000 mg | ORAL_TABLET | Freq: Two times a day (BID) | ORAL | Status: DC
  Administered 2019-06-29: 5 mg via ORAL
  Filled 2019-06-29 (×2): qty 1

## 2019-06-29 MED ORDER — HEPARIN (PORCINE) 25000 UT/250ML-% IV SOLN
750.0000 [IU]/h | INTRAVENOUS | Status: DC
  Administered 2019-06-29: 750 [IU]/h via INTRAVENOUS
  Filled 2019-06-29: qty 250

## 2019-06-29 MED ORDER — HEPARIN SODIUM (PORCINE) 5000 UNIT/ML IJ SOLN: 60.0000 [IU]/kg | Freq: Once | INTRAMUSCULAR | Status: DC

## 2019-06-29 MED ORDER — ASPIRIN 81 MG PO CHEW
324.0000 mg | CHEWABLE_TABLET | Freq: Once | ORAL | Status: AC
  Administered 2019-06-29: 324 mg via ORAL
  Filled 2019-06-29: qty 4

## 2019-06-29 NOTE — Consult Note (Signed)
Cardiology Consultation:   Patient ID: [name] MRN: [id number]; DOB: 1960-12-12  Admit date: 06/28/2019 Date of Consult: 06/29/2019  Primary Care Provider: Georgianne Fick, MD Primary Cardiologist: Lesleigh Noe, MD  Primary Electrophysiologist:  None    Patient Profile:   [name] is a 58 y.o. 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   History of Present Illness:   [name] was accompanying her husband who was a patient in the [employer]. While in the cafeteria, she developed nausea, diaphoresis, and lightheadedness and had a witnessed syncopal event. She lost continence of urine. She was assisted to the bathroom thereafter, had a BM, and reports then returning to her baseline. She denied CP, SOB, or palpitations associated with the episode. Of note, dyspnea has historically been her anginal equivalent.   IN the ER, patient normotensive, asymptomatic. EKG unchanged and non-ischemic. hsTn ordered and returned ~240 and flat. Heparin bolus given and gtt started. Of note, has had prior Trop-I ~ 0.35. Cardiology consulted out of concern for ACS.   Upon my interview with patient, she was sleeping comfortably and at her baseline.   Past Medical History:  Diagnosis Date  . Atrial fibrillation (HCC)   . Atrial flutter (HCC)   . CHF (congestive heart failure) (HCC)   . Coronary atherosclerosis of native coronary artery   . HTN (hypertension)   . Hx of CABG    LIMA-LAD, SVG-DIAG, SVG-OM, SVG-RCA  . Hypercholesteremia   . Myocardial infarct (HCC) 2007  . NSTEMI (non-ST elevated myocardial infarction) (HCC) 10/13/2015  . Obesity     Past Surgical History:  Procedure Laterality Date  . ABDOMINAL HYSTERECTOMY    . CARDIAC CATHETERIZATION N/A 10/14/2015   Procedure: Left Heart Cath and Cors/Grafts Angiography;  Surgeon: Marykay Lex, MD;   Location: [facility] INVASIVE CV LAB;  Service: Cardiovascular;  Laterality: N/A;  . CARDIAC CATHETERIZATION N/A 10/14/2015   Procedure: Coronary Stent Intervention;  Surgeon: Marykay Lex, MD;  Location: [facility] INVASIVE CV LAB;  Service: Cardiovascular;  Laterality: N/A;  . CESAREAN SECTION  1985  . CORONARY ANGIOPLASTY WITH STENT PLACEMENT  2003   LAD Promus DES [name] 10/13/2015  . CORONARY ARTERY BYPASS GRAFT  03/2006   "CABG X3"  . LEFT HEART CATH AND CORS/GRAFTS ANGIOGRAPHY N/A 12/09/2017   Procedure: LEFT HEART CATH AND CORS/GRAFTS ANGIOGRAPHY;  Surgeon: Marykay Lex, MD;  Location: [facility] INVASIVE CV LAB;  Service: Cardiovascular;  Laterality: N/A;  . MITRAL VALVE REPAIR     [name] 10/13/2015  . TEE WITHOUT CARDIOVERSION N/A 12/07/2017   Procedure: TRANSESOPHAGEAL ECHOCARDIOGRAM (TEE);  Surgeon: Chrystie Nose, MD;  Location: [facility] ENDOSCOPY;  Service: Cardiovascular;  Laterality: N/A;     Home Medications:  Prior to Admission medications   Medication Sig Start Date End Date Taking? Authorizing Provider  acetaminophen (TYLENOL) 500 MG tablet Take 1,000 mg by mouth as needed for mild pain.    [provider]  apixaban (ELIQUIS) 5 MG TABS tablet Take 1 tablet (5 mg total) by mouth 2 (two) times daily. 12/22/18   Kroeger, Ovidio Kin., PA-C  aspirin EC 81 MG tablet Take 1 tablet (81 mg total) by mouth daily. 01/25/17   Lyn Records, MD  atorvastatin (LIPITOR) 80 MG tablet TAKE 1 TABLET BY MOUTH EVERY DAY 04/02/19   [name], NP  digoxin (LANOXIN) 0.25 MG tablet TAKE 1 TABLET  BY MOUTH DAILY. PLEASE MAKE OVERDUE APPT WITH DR. Katrinka BlazingSMITH BEFORE ANYMORE RF 1ST ATTEMPT 05/04/19   Lyn RecordsSmith, Henry W, MD  furosemide (LASIX) 40 MG tablet Take 1 tablet (40 mg total) by mouth daily. 06/18/19   Lyn RecordsSmith, Henry W, MD  metoprolol succinate (TOPROL-XL) 100 MG 24 hr tablet TAKE 1 TABLET (100 MG TOTAL) BY MOUTH 2 (TWO) TIMES DAILY. 04/02/19   Lyn RecordsSmith, Henry W, MD  Multiple Vitamin (MULTIVITAMIN WITH MINERALS) TABS  tablet Take 1 tablet by mouth daily.    [provider]  nitroGLYCERIN (NITROSTAT) 0.4 MG SL tablet PLEASE SEE ATTACHED FOR DETAILED DIRECTIONS 03/31/18   Lyn RecordsSmith, Henry W, MD  sacubitril-valsartan (ENTRESTO) 49-51 MG Take 1 tablet by mouth 2 (two) times daily. 12/22/18   Kroeger, Ovidio KinKrista M., PA-C  triamcinolone cream (KENALOG) 0.1 % Apply 1 application topically at bedtime.    [provider]    Inpatient Medications: Scheduled Meds:  Continuous Infusions: . heparin 750 Units/hr (06/29/19 0419)   PRN Meds:   Allergies:    Allergies  Allergen Reactions  . Other Itching    GEL ON ELECTRODES FOR EKG  . Adhesive [Tape]     Social History:   Social History   Socioeconomic History  . Marital status: Married    Spouse name: Not on file  . Number of children: Not on file  . Years of education: Not on file  . Highest education level: Not on file  Occupational History  . Not on file  Tobacco Use  . Smoking status: Former Smoker    Packs/day: 1.00    Years: 30.00    Pack years: 30.00    Types: Cigarettes  . Smokeless tobacco: Never Used  . Tobacco comment: "quit smoking cigarettes in ~ 2007"  Substance and Sexual Activity  . Alcohol use: Yes    Alcohol/week: 1.0 standard drinks    Types: 1 Cans of beer per week    Comment: occ  . Drug use: No  . Sexual activity: Not Currently  Other Topics Concern  . Not on file  Social History Narrative  . Not on file   Social Determinants of Health   Financial Resource Strain:   . Difficulty of Paying Living Expenses: Not on file  Food Insecurity:   . Worried About Programme researcher, broadcasting/film/videounning Out of Food in the Last Year: Not on file  . Ran Out of Food in the Last Year: Not on file  Transportation Needs:   . Lack of Transportation (Medical): Not on file  . Lack of Transportation (Non-Medical): Not on file  Physical Activity:   . Days of Exercise per Week: Not on file  . Minutes of Exercise per Session: Not on file  Stress:   .  Feeling of Stress : Not on file  Social Connections:   . Frequency of Communication with Friends and Family: Not on file  . Frequency of Social Gatherings with Friends and Family: Not on file  . Attends Religious Services: Not on file  . Active Member of Clubs or Organizations: Not on file  . Attends BankerClub or Organization Meetings: Not on file  . Marital Status: Not on file  Intimate Partner Violence:   . Fear of Current or Ex-Partner: Not on file  . Emotionally Abused: Not on file  . Physically Abused: Not on file  . Sexually Abused: Not on file    Family History:    Family History  Problem Relation Age of Onset  . Hypertension Father   .  Stroke Sister   . Hypertension Sister   . Hypertension Brother   . Heart attack Neg Hx      Review of Systems: [y] = yes, [ ]  = no     General: Weight gain [ ] ; Weight loss [ ] ; Anorexia [ ] ; Fatigue [ ] ; Fever [ ] ; Chills [ ] ; Weakness [ ]    Cardiac: Chest pain/pressure Blue.Reese ]; Resting SOB [ ] ; Exertional SOB [ ] ; Orthopnea [ ] ; Pedal Edema [ ] ; Palpitations [ ] ; Syncope [ ] ; Presyncope [ ] ; Paroxysmal nocturnal dyspnea[ ]    Pulmonary: Cough [ ] ; Wheezing[ ] ; Hemoptysis[ ] ; Sputum [ ] ; Snoring [ ]    GI: Vomiting[ ] ; Dysphagia[ ] ; Melena[ ] ; Hematochezia [ ] ; Heartburn[ ] ; Abdominal pain [ ] ; Constipation [ ] ; Diarrhea [ ] ; BRBPR [ ]    GU: Hematuria[ ] ; Dysuria [ ] ; Nocturia[ ]    Vascular: Pain in legs with walking [ ] ; Pain in feet with lying flat [ ] ; Non-healing sores [ ] ; Stroke [ ] ; TIA [ ] ; Slurred speech [ ] ;   Neuro: Headaches[ ] ; Vertigo[ ] ; Seizures[ ] ; Paresthesias[ ] ;Blurred vision [ ] ; Diplopia [ ] ; Vision changes [ ]    Ortho/Skin: Arthritis [ ] ; Joint pain [ ] ; Muscle pain [ ] ; Joint swelling [ ] ; Back Pain [ ] ; Rash [ ]    Psych: Depression[ ] ; Anxiety[ ]    Heme: Bleeding problems [ ] ; Clotting disorders [ ] ; Anemia [ ]    Endocrine: Diabetes [ ] ; Thyroid dysfunction[ ]   Physical Exam/Data:   Vitals:   06/29/19 0200  06/29/19 0301 06/29/19 0330 06/29/19 0400  BP:  (!) 147/113 (!) 98/57 119/82  Pulse:  81 73 81  Resp:  17 20 (!) 23  Temp:      TempSrc:      SpO2:  98% 100% 99%  Weight: 65.8 kg      No intake or output data in the 24 hours ending 06/29/19 0545 Filed Weights   06/29/19 0200  Weight: 65.8 kg   Body mass index is 26.1 kg/m.  General:  Well nourished, well developed, in no acute distress HEENT: normal Neck: no JVD Endocrine:  No thryomegaly Vascular: No carotid bruits; FA pulses 2+ bilaterally without bruits  Cardiac:  normal S1, S2; RRR; no murmur  Lungs:  clear to auscultation bilaterally, no wheezing, rhonchi or rales  Abd: soft, nontender Ext: no edema Musculoskeletal:  No deformities, BUE and BLE strength normal and equal Skin: warm and dry  Neuro:  CNs 2-12 intact, no focal abnormalities noted Psych:  Normal affect   EKG:  The EKG was personally reviewed and demonstrates SR TWI V1-V3.   Relevant CV Studies: Study Conclusions  - Left ventricle: False tendon in mid LV cavity of no clinical   significance. The cavity size was mildly dilated. There was   moderate asymmetric hypertrophy. Systolic function was severely   reduced. The estimated ejection fraction was in the range of 25%   to 30%. Severe diffuse hypokinesis with distinct regional wall   motion abnormalities. There is akinesis of the inferolateral   myocardium. The study was not technically sufficient to allow   evaluation of LV diastolic dysfunction due to atrial   fibrillation. Doppler parameters are consistent with high   ventricular filling pressure. - Ventricular septum: The contour showed diastolic flattening and   systolic flattening. These changes are consistent with RV volume   and pressure overload. - Aortic valve: There was trivial regurgitation. - Mitral valve: Mobility of  the posterior leaflet was restricted to   the point of immobility. The findings are consistent with   moderate stenosis.  There was mild regurgitation directed   eccentrically. Mean gradient (D): 9 mm Hg. Valve area by   continuity equation (using LVOT flow): 1.02 cm^2. - Left atrium: The atrium was severely dilated. - Right ventricle: The cavity size was mildly dilated. Wall   thickness was normal. Systolic function was moderately reduced. - Pulmonary arteries: PA peak pressure: 59 mm Hg (S).  Impressions:  - The right ventricular systolic pressure was increased consistent   with moderate pulmonary hypertension.  Laboratory Data:  Chemistry Recent Labs  Lab 06/29/19 0004  NA 141  K 3.4*  CL 104  CO2 26  GLUCOSE 134*  BUN 16  CREATININE 1.29*  CALCIUM 9.5  GFRNONAA 45*  GFRAA 52*  ANIONGAP 11    Recent Labs  Lab 06/29/19 0004  PROT 7.5  ALBUMIN 4.1  AST 21  ALT 14  ALKPHOS 58  BILITOT 0.8   Hematology Recent Labs  Lab 06/29/19 0004  WBC 6.7  RBC 4.65  HGB 14.3  HCT 44.3  MCV 95.3  MCH 30.8  MCHC 32.3  RDW 13.7  PLT 201   Cardiac EnzymesNo results for input(s): TROPONINI in the last 168 hours. No results for input(s): TROPIPOC in the last 168 hours.  BNPNo results for input(s): BNP, PROBNP in the last 168 hours.  DDimer No results for input(s): DDIMER in the last 168 hours.  Radiology/Studies:  DG Chest 2 View  Result Date: 06/29/2019 CLINICAL DATA:  Weakness.  Near syncope. EXAM: CHEST - 2 VIEW COMPARISON:  Dec 16, 2017 FINDINGS: The heart size remains enlarged. The patient is status post prior median sternotomy. Aortic calcifications are noted. There is no acute osseous abnormality. IMPRESSION: No active cardiopulmonary disease. Electronically Signed   By: Katherine Mantle M.D.   On: 06/29/2019 00:28    Assessment and Plan:   [name] is a 58 year old woman with known CAD, pAF, and ischemic cardiomyopathy who presents after an episode of syncope. Based upon her description and clear prodrome, suspect vasovagal in origin and likely not cardiac in nature. She is  found to have elevated hsTn x 2 that is consistent with prior stable Trop-I values. No CP, anginal equivalent, or dyspnea associated with this episode and no concerning EKG changes. Thus, low suspicion for associated ACS at this time and favor that mildly elevated hsTn is more likely in the setting of known cardiomyopathy. Will plan to obtain TTE this AM to r/o new WMAs and likely discharge with close follow up thereafter.     For questions or updates, please contact [facility] Please consult [email or web address] for contact info under    Signed, Laurell Josephs, MD  06/29/2019 5:45 AM

## 2019-06-29 NOTE — Progress Notes (Signed)
  Echocardiogram 2D Echocardiogram has been performed.  [name] 06/29/2019, 10:14 AM

## 2019-06-29 NOTE — ED Provider Notes (Signed)
Attestation: Medical screening examination/treatment/procedure(s) were conducted as a shared visit with non-physician practitioner(s) and myself.  I personally evaluated the patient during the encounter.  Briefly, the patient is a 58 y.o. female with significant cardiac history, here as a visitor with husband had a near syncopal episode.   Vitals:   06/29/19 0500 06/29/19 0600  BP: (!) 107/54 (!) 113/53  Pulse: 65 67  Resp: 18 17  Temp:    SpO2: 96% 96%    CONSTITUTIONAL:  well-appearing, NAD NEURO:  Alert and oriented x 3, no focal deficits EYES:  pupils equal and reactive ENT/NECK:  trachea midline, no JVD CARDIO:  Reg  rate, reg rhythm, well-perfused PULM:  None labored breathing GI/GU:  Abdomin non-distended MSK/SPINE:  No gross deformities, no edema SKIN:  no rash, atraumatic PSYCH:  Appropriate speech and behavior       Work up nonischemic EKG. Elevated initial trop. Episode was more consistent with vaso vagal. But given elevated trops, case discussed with cardiology. They agreed with assessment, but given her cardiac history, they recommended ECHO and repeat trops.   Repeat trop stable. Pending ECHO.   Signed out to oncoming team. Anticipate DC with close follow up.       Fatima Blank, MD 06/29/19 [phone]

## 2019-06-29 NOTE — ED Provider Notes (Signed)
Pt's care assumed by me at 7a, Pt has 2 positive troponins.  Pt has a significant history of CAD.  Plan  Cardiology fellow request echocardiogram and to be called with results. Pt reports  Pt reports dizziness has resolved. Pt reports no further sweating episodes. Pt has not been taking her eliquis  I spoke with Dr. Debara Pickett who advised they will schedule pt for oupatient follow up.       Fransico Meadow, PA-C 06/29/19 1506    Virgel Manifold, MD 07/03/19 [phone]

## 2019-06-29 NOTE — ED Provider Notes (Signed)
[facility]   CSN: [id number] Arrival date & time: 06/28/19  2224     History Chief Complaint  Patient presents with  . Weakness    [name] is a 58 y.o. female with a hx of atrial fibrillation, high cholesterol, coronary artery disease with history of MI, hypertension, CHF peripheral vascular disease presents to the Emergency Department complaining of acute onset generalized weakness while here in the emergency department with her husband.  She reports that after eating in the cafeteria she felt lightheaded, became sweaty, mildly short of breath and nauseated.  She layed on the floor and likely had a syncopal event as the RN reports patient urinated on herself.  Patient reports she was assisted to the bathroom where she had a bowel movement and now feels much better.  She reports all of her symptoms have resolved.  Patient denies chest pain at any point today.  She cannot remember if she had chest pain with her previous MI.  Patient states she is very anxious about her husband's symptoms.  No known sick contacts.  He is here for altered mental status that is currently undifferentiated.  He does not have Covid-like symptoms.  Patient denies additional symptoms, fever at home.  No specific aggravating or alleviating factors.   The history is provided by the patient and medical records. No language interpreter was used.       Past Medical History:  Diagnosis Date  . Atrial fibrillation (HCC)   . Atrial flutter (HCC)   . CHF (congestive heart failure) (HCC)   . Coronary atherosclerosis of native coronary artery   . HTN (hypertension)   . Hx of CABG    LIMA-LAD, SVG-DIAG, SVG-OM, SVG-RCA  . Hypercholesteremia   . Myocardial infarct (HCC) 2007  . NSTEMI (non-ST elevated myocardial infarction) (HCC) 10/13/2015  . Obesity     Patient Active Problem List   Diagnosis Date Noted  . Peripheral vascular disease, unspecified (HCC)  03/06/2019  . Acute on chronic congestive heart failure (HCC)   . Respiratory failure with hypercapnia (HCC) 12/16/2017  . Atrial flutter with rapid ventricular response (HCC) 12/16/2017  . Respiratory distress 12/16/2017  . Elevated troponin   . ACS (acute coronary syndrome) (HCC) 10/15/2015  . NSTEMI (non-ST elevated myocardial infarction) (HCC)   . Hypercholesteremia   . New onset atrial fibrillation (HCC)   . Chronic combined systolic and diastolic HF (heart failure), NYHA class 2 (HCC)   . Obesity   . Coronary artery disease involving coronary bypass graft of native heart with angina pectoris (HCC)   . HTN (hypertension)     Past Surgical History:  Procedure Laterality Date  . ABDOMINAL HYSTERECTOMY    . CARDIAC CATHETERIZATION N/A 10/14/2015   Procedure: Left Heart Cath and Cors/Grafts Angiography;  Surgeon: Marykay Lexavid W Harding, MD;  [facility];  Service: Cardiovascular;  Laterality: N/A;  . CARDIAC CATHETERIZATION N/A 10/14/2015   Procedure: Coronary Stent Intervention;  Surgeon: Marykay Lexavid W Harding, MD;  [facility];  Service: Cardiovascular;  Laterality: N/A;  . CESAREAN SECTION  1985  . CORONARY ANGIOPLASTY WITH STENT PLACEMENT  2003   LAD Promus DES [name]/notes 10/13/2015  . CORONARY ARTERY BYPASS GRAFT  03/2006   "CABG X3"  . LEFT HEART CATH AND CORS/GRAFTS ANGIOGRAPHY N/A 12/09/2017   Procedure: LEFT HEART CATH AND CORS/GRAFTS ANGIOGRAPHY;  Surgeon: Marykay LexHarding, David W, MD;  [facility];  Service: Cardiovascular;  Laterality:  N/A;  . MITRAL VALVE REPAIR     [name] 10/13/2015  . TEE WITHOUT CARDIOVERSION N/A 12/07/2017   Procedure: TRANSESOPHAGEAL ECHOCARDIOGRAM (TEE);  Surgeon: Chrystie Nose, MD;  Location: [facility] ENDOSCOPY;  Service: Cardiovascular;  Laterality: N/A;     OB History   No obstetric history on file.     Family History  Problem Relation Age of Onset  . Hypertension Father   . Stroke Sister   . Hypertension Sister   .  Hypertension Brother   . Heart attack Neg Hx     Social History   Tobacco Use  . Smoking status: Former Smoker    Packs/day: 1.00    Years: 30.00    Pack years: 30.00    Types: Cigarettes  . Smokeless tobacco: Never Used  . Tobacco comment: "quit smoking cigarettes in ~ 2007"  Substance Use Topics  . Alcohol use: Yes    Alcohol/week: 1.0 standard drinks    Types: 1 Cans of beer per week    Comment: occ  . Drug use: No    Home Medications Prior to Admission medications   Medication Sig Start Date End Date Taking? Authorizing Provider  acetaminophen (TYLENOL) 500 MG tablet Take 1,000 mg by mouth as needed for mild pain.    [provider]  apixaban (ELIQUIS) 5 MG TABS tablet Take 1 tablet (5 mg total) by mouth 2 (two) times daily. 12/22/18   Kroeger, Ovidio Kin., PA-C  aspirin EC 81 MG tablet Take 1 tablet (81 mg total) by mouth daily. 01/25/17   Lyn Records, MD  atorvastatin (LIPITOR) 80 MG tablet TAKE 1 TABLET BY MOUTH EVERY DAY 04/02/19   [name], NP  digoxin (LANOXIN) 0.25 MG tablet TAKE 1 TABLET BY MOUTH DAILY. PLEASE MAKE OVERDUE APPT WITH DR. Katrinka Blazing BEFORE ANYMORE RF 1ST ATTEMPT 05/04/19   Lyn Records, MD  furosemide (LASIX) 40 MG tablet Take 1 tablet (40 mg total) by mouth daily. 06/18/19   Lyn Records, MD  metoprolol succinate (TOPROL-XL) 100 MG 24 hr tablet TAKE 1 TABLET (100 MG TOTAL) BY MOUTH 2 (TWO) TIMES DAILY. 04/02/19   Lyn Records, MD  Multiple Vitamin (MULTIVITAMIN WITH MINERALS) TABS tablet Take 1 tablet by mouth daily.    [provider]  nitroGLYCERIN (NITROSTAT) 0.4 MG SL tablet PLEASE SEE ATTACHED FOR DETAILED DIRECTIONS 03/31/18   Lyn Records, MD  sacubitril-valsartan (ENTRESTO) 49-51 MG Take 1 tablet by mouth 2 (two) times daily. 12/22/18   Kroeger, Ovidio Kin., PA-C  triamcinolone cream (KENALOG) 0.1 % Apply 1 application topically at bedtime.    [provider]    Allergies    Other and Adhesive [tape]  Review of  Systems   Review of Systems  Constitutional: Positive for diaphoresis. Negative for appetite change, fatigue, fever and unexpected weight change.  HENT: Negative for mouth sores.   Eyes: Negative for visual disturbance.  Respiratory: Negative for cough, chest tightness, shortness of breath and wheezing.   Cardiovascular: Negative for chest pain.  Gastrointestinal: Positive for nausea. Negative for abdominal pain, constipation, diarrhea and vomiting.  Endocrine: Negative for polydipsia, polyphagia and polyuria.  Genitourinary: Negative for dysuria, frequency, hematuria and urgency.       1 episode of urinary incontinence  Musculoskeletal: Negative for back pain and neck stiffness.  Skin: Negative for rash.  Allergic/Immunologic: Negative for immunocompromised state.  Neurological: Positive for dizziness, weakness and light-headedness. Negative for syncope and headaches.  Hematological: Does not bruise/bleed easily.  Psychiatric/Behavioral: Negative for sleep disturbance. The patient is not nervous/anxious.     Physical Exam Updated Vital Signs BP 102/65 (BP Location: Right Arm)   Pulse 82   Temp (!) 97.5 F (36.4 C) (Oral)   Resp 18   SpO2 98%   Physical Exam Vitals and nursing note reviewed.  Constitutional:      General: She is not in acute distress.    Appearance: She is not diaphoretic.  HENT:     Head: Normocephalic.  Eyes:     General: No scleral icterus.    Conjunctiva/sclera: Conjunctivae normal.  Cardiovascular:     Rate and Rhythm: Normal rate and regular rhythm.     Pulses: Normal pulses.          Radial pulses are 2+ on the right side and 2+ on the left side.     Heart sounds: No murmur.     Comments: No calf tenderness Pulmonary:     Effort: No tachypnea, accessory muscle usage, prolonged expiration, respiratory distress or retractions.     Breath sounds: Normal breath sounds. No stridor.     Comments: Equal chest rise. No increased work of  breathing. Clear and equal breath sounds Chest:     Comments: Well-healed sternotomy scar Abdominal:     General: There is no distension.     Palpations: Abdomen is soft.     Tenderness: There is no abdominal tenderness. There is no guarding or rebound.  Musculoskeletal:     Cervical back: Normal range of motion.     Right lower leg: No edema.     Left lower leg: No edema.     Comments: Moves all extremities equally and without difficulty.  Skin:    General: Skin is warm and dry.     Capillary Refill: Capillary refill takes less than 2 seconds.  Neurological:     Mental Status: She is alert.     GCS: GCS eye subscore is 4. GCS verbal subscore is 5. GCS motor subscore is 6.     Comments: Speech is clear and goal oriented.  Psychiatric:        Mood and Affect: Mood normal.     ED Results / Procedures / Treatments   Labs (all labs ordered are listed, but only abnormal results are displayed) Labs Reviewed  COMPREHENSIVE METABOLIC PANEL - Abnormal; Notable for the following components:      Result Value   Potassium 3.4 (*)    Glucose, Bld 134 (*)    Creatinine, Ser 1.29 (*)    GFR calc non Af Amer 45 (*)    GFR calc Af Amer 52 (*)    All other components within normal limits  HEPARIN LEVEL (UNFRACTIONATED) - Abnormal; Notable for the following components:   Heparin Unfractionated <0.10 (*)    All other components within normal limits  RAPID URINE DRUG SCREEN, HOSP PERFORMED - Abnormal; Notable for the following components:   Cocaine POSITIVE (*)    All other components within normal limits  URINALYSIS, ROUTINE W REFLEX MICROSCOPIC - Abnormal; Notable for the following components:   APPearance HAZY (*)    Hgb urine dipstick SMALL (*)    Bacteria, UA RARE (*)    All other components within normal limits  CBG MONITORING, ED - Abnormal; Notable for the following components:   Glucose-Capillary 120 (*)    All other components within normal limits  TROPONIN I (HIGH  SENSITIVITY) - Abnormal; Notable for the following components:  Troponin I (High Sensitivity) 238 (*)    All other components within normal limits  TROPONIN I (HIGH SENSITIVITY) - Abnormal; Notable for the following components:   Troponin I (High Sensitivity) 241 (*)    All other components within normal limits  SARS CORONAVIRUS 2 (TAT 6-24 HRS)  CBC WITH DIFFERENTIAL/PLATELET  LIPASE, BLOOD  URINALYSIS, ROUTINE W REFLEX MICROSCOPIC  HEPARIN LEVEL (UNFRACTIONATED)  APTT  APTT    EKG  ED ECG REPORT   Date: 06/29/2019  Rate: 74  Rhythm: normal sinus rhythm and premature atrial contractions (PAC)  QRS Axis: normal  Intervals: PR prolonged  ST/T Wave abnormalities: nonspecific ST changes  Conduction Disutrbances:first-degree A-V block   Narrative Interpretation:   Old EKG Reviewed: Previous EKG with first-degree AV block.  I have personally reviewed the EKG tracing and agree with the computerized printout as noted.       Radiology DG Chest 2 View  Result Date: 06/29/2019 CLINICAL DATA:  Weakness.  Near syncope. EXAM: CHEST - 2 VIEW COMPARISON:  Dec 16, 2017 FINDINGS: The heart size remains enlarged. The patient is status post prior median sternotomy. Aortic calcifications are noted. There is no acute osseous abnormality. IMPRESSION: No active cardiopulmonary disease. Electronically Signed   By: Katherine Mantle M.D.   On: 06/29/2019 00:28    Procedures Procedures (including critical care time)  Medications Ordered in ED Medications  heparin ADULT infusion 100 units/mL (25000 units/244mL sodium chloride 0.45%) (750 Units/hr Intravenous New Bag/Given 06/29/19 0419)  aspirin chewable tablet 324 mg (324 mg Oral Given 06/29/19 0408)    ED Course  I have reviewed the triage vital signs and the nursing notes.  Pertinent labs & imaging results that were available during my care of the patient were reviewed by me and considered in my medical decision making (see chart for  details).  Clinical Course as of Jun 29 539  Thu Jun 28, 2019  2324 May 2019 Cardiac Cath: Severe native disease with occluded mid RCA, mid circumflex and mid LAD as well as Diag2 Now 2 out of 4-5 grafts remain patent SVG-Diag2 is patent with collaterals filling OM 2 and distal Circumflex, LIMA-LAD is patent.   [HM]  Fri Jun 29, 2019  1610 Rechecked on patient.  She continues to feel well.  No persistent or new symptoms.  No return of symptoms.   [HM]  0526 Troponin I (High Sensitivity)(!!): 241 [HM]  0526 Essentially unchanged.  Troponin I (High Sensitivity)(!!): 241 [HM]  0540 Noted  COCAINE(!): POSITIVE [HM]    Clinical Course User Index [HM] Anelly Samarin, Boyd Kerbs   MDM Rules/Calculators/A&P                              Patient presents after episode of weakness.  Given her cardiac history we will obtain labs, EKG and chest x-ray.  She reports feeling better at this time.  Orthostatic VS for the past 24 hrs:  BP- Lying Pulse- Lying BP- Sitting Pulse- Sitting BP- Standing at 0 minutes Pulse- Standing at 0 minutes  06/28/19 2318 113/67 80 122/48 88 114/49 85   She is not orthostatic.  No tachycardia or shortness of breath.  3:00 AM Patient with near syncopal episode.  Now with elevated troponin.  EKG does not show STEMI.  It is largely unchanged from previous.  No new ischemia noted on EKG today.    Patient's last admission for cardiac complaint was in May 2019.  She was admitted for new onset A. Fib, CHF and NSTEMI at that time.  She did have a cath which showed progressive graft disease but no stent that needed placement.  Elevation in troponin was thought to be due to a flutter with RVR and she was discharged home on Eliquis, digoxin, diltiazem and metoprolol.  At that time the patient's EF was 25-30%.  Unclear whether or not today's episode was a vasovagal versus cardiac.  Given that she had a prodrome, less likely to be arrhythmia causing syncope.  03:35  AM Discussed with cardiology who will evaluate here in the emergency department.  The patient was discussed with and seen by Dr. Leonette Monarch who agrees with the treatment plan.  5:28 AM Repeat troponin is essentially unchanged.  Awaiting cardiology evaluation.  5:40 AM Cocaine positive. Pt evaluated by Cardiology.  Suspect elevated trop is 2/2 her heart failure.  She has no shortness of breath or hypoxia here in the emergency department.  Will order echocardiogram.  Heparin discontinued.  If no new wall motion abnormalities or significant decrease in EF she may be discharged from the emergency department with close cardiology follow-up.  Cardiology fellow will contact the day team to ensure close follow-up.  6:53 AM At shift change care was transferred to [facility], PA-C who will follow pending studies, re-evaulate and determine disposition.       Final Clinical Impression(s) / ED Diagnoses Final diagnoses:  Syncope, unspecified syncope type  Elevated troponin    Rx / DC Orders ED Discharge Orders    None       Raymona Boss, Gwenlyn Perking 06/29/19 2595    Fatima Blank, MD 06/29/19 [phone]

## 2019-06-29 NOTE — Progress Notes (Addendum)
DAILY PROGRESS NOTE   Patient Name: Unity Luepke Date of Encounter: 06/29/2019 Cardiologist: Lesleigh Noe, MD  Chief Complaint   I feel better  Patient Profile   Maimuna Leaman is a 58 y.o. 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   Subjective   Mrs. Mendosa was seen later this morning in the ER after her echo was personally reviewed. She reports she is feeling much better. The echo shows a stable LVEF of 35-40%, her troponins are elevated, but stable compared to troponin I's in the past. Syncope was in the setting of her husband with significant illness in the ER - he is now admitted. She felt flushed and hot all over and then passed out. Very suspicious for vasovagal syncope. Apparently, however, the urine drug screen was positive for cocaine, however, she denies this.   Objective   Vitals:   06/29/19 1300 06/29/19 1400 06/29/19 1430 06/29/19 1500  BP: 132/73 118/71 (!) 141/67 (!) 135/55  Pulse: 71  66 64  Resp: 14 18 17 16   Temp:      TempSrc:      SpO2: 100%  100% 100%  Weight:       No intake or output data in the 24 hours ending 06/29/19 1605 Filed Weights   06/29/19 0200  Weight: 65.8 kg    Physical Exam   General appearance: alert and no distress Neck: no JVD, supple, symmetrical, trachea midline and thyroid not enlarged, symmetric, no tenderness/mass/nodules Lungs: diminished breath sounds bibasilar Heart: regular rate and rhythm Abdomen: soft, non-tender; bowel sounds normal; no masses,  no organomegaly Extremities: extremities normal, atraumatic, no cyanosis or edema Pulses: 2+ and symmetric Skin: Skin color, texture, turgor normal. No rashes or lesions Neurologic: Grossly normal Psych: Pleasant  Inpatient Medications    Scheduled Meds: . apixaban  5 mg Oral BID    Labs   Results for orders placed or performed  during the hospital encounter of 06/28/19 (from the past 48 hour(s))  CBG monitoring, ED     Status: Abnormal   Collection Time: 06/28/19 10:30 PM  Result Value Ref Range   Glucose-Capillary 120 (H) 70 - 99 mg/dL  CBC with Differential     Status: None   Collection Time: 06/29/19 12:04 AM  Result Value Ref Range   WBC 6.7 4.0 - 10.5 K/uL   RBC 4.65 3.87 - 5.11 MIL/uL   Hemoglobin 14.3 12.0 - 15.0 g/dL   HCT 14/11/20 62.1 - 94.7 %   MCV 95.3 80.0 - 100.0 fL   MCH 30.8 26.0 - 34.0 pg   MCHC 32.3 30.0 - 36.0 g/dL   RDW 12.5 27.1 - 29.2 %   Platelets 201 150 - 400 K/uL   nRBC 0.0 0.0 - 0.2 %   Neutrophils Relative % 50 %   Neutro Abs 3.4 1.7 - 7.7 K/uL   Lymphocytes Relative 41 %   Lymphs Abs 2.7 0.7 - 4.0 K/uL   Monocytes Relative 6 %   Monocytes Absolute 0.4 0.1 - 1.0 K/uL   Eosinophils Relative 2 %   Eosinophils Absolute 0.1 0.0 - 0.5 K/uL   Basophils Relative 1 %   Basophils Absolute 0.1 0.0 - 0.1 K/uL   Immature Granulocytes 0 %   Abs Immature Granulocytes 0.02 0.00 - 0.07 K/uL    Comment: Performed at [facility], [address]  [place], [place] [address]  CMP     Status: Abnormal   Collection Time: 06/29/19 12:04 AM  Result Value Ref Range   Sodium 141 135 - 145 mmol/L   Potassium 3.4 (L) 3.5 - 5.1 mmol/L   Chloride 104 98 - 111 mmol/L   CO2 26 22 - 32 mmol/L   Glucose, Bld 134 (H) 70 - 99 mg/dL   BUN 16 6 - 20 mg/dL   Creatinine, Ser 5.401.29 (H) 0.44 - 1.00 mg/dL   Calcium 9.5 8.9 - 98.110.3 mg/dL   Total Protein 7.5 6.5 - 8.1 g/dL   Albumin 4.1 3.5 - 5.0 g/dL   AST 21 15 - 41 U/L   ALT 14 0 - 44 U/L   Alkaline Phosphatase 58 38 - 126 U/L   Total Bilirubin 0.8 0.3 - 1.2 mg/dL   GFR calc non Af Amer 45 (L) >60 mL/min   GFR calc Af Amer 52 (L) >60 mL/min   Anion gap 11 5 - 15    Comment: Performed at [facility], [address] [place], [place] [address]  Troponin I (High Sensitivity)     Status: Abnormal   Collection Time: 06/29/19 12:04 AM  Result Value  Ref Range   Troponin I (High Sensitivity) 238 (HH) <18 ng/L    Comment: CRITICAL RESULT CALLED TO, READ BACK BY AND VERIFIED WITH: EN L CHILTON @0135  06/29/19 BY S GEZAHEGN Performed at [facility], [address] [place], [place] [address]   Lipase, blood     Status: None   Collection Time: 06/29/19 12:04 AM  Result Value Ref Range   Lipase 34 11 - 51 U/L    Comment: Performed at [facility], [address] [place], [place] [address]  SARS CORONAVIRUS 2 (TAT 6-24 HRS) Nasopharyngeal Nasopharyngeal Swab     Status: None   Collection Time: 06/29/19  1:03 AM   Specimen: Nasopharyngeal Swab  Result Value Ref Range   SARS Coronavirus 2 NEGATIVE NEGATIVE    Comment: (NOTE) SARS-CoV-2 target nucleic acids are NOT DETECTED. The SARS-CoV-2 RNA is generally detectable in upper and lower respiratory specimens during the acute phase of infection. Negative results do not preclude SARS-CoV-2 infection, do not rule out co-infections with other pathogens, and should not be used as the sole basis for treatment or other patient management decisions. Negative results must be combined with clinical observations, patient history, and epidemiological information. The expected result is Negative. Fact Sheet for Patients: HairSlick.nohttps://www.fda.gov/media/138098/download Fact Sheet for Healthcare Providers: quierodirigir.comhttps://www.fda.gov/media/138095/download This test is not yet approved or cleared by the [place] FDA and  has been authorized for detection and/or diagnosis of SARS-CoV-2 by FDA under an Emergency Use Authorization (EUA). This EUA will remain  in effect (meaning this test can be used) for the duration of the COVID-19 declaration under Section 56 4(b)(1) of the Act, 21 U.S.C. section 360bbb-3(b)(1), unless the authorization is terminated or revoked sooner. Performed at [facility], [address] [place], [place] [address]   Troponin I (High Sensitivity)     Status: Abnormal    Collection Time: 06/29/19  1:23 AM  Result Value Ref Range   Troponin I (High Sensitivity) 241 (HH) <18 ng/L    Comment: CRITICAL VALUE NOTED.  VALUE IS CONSISTENT WITH PREVIOUSLY REPORTED AND CALLED VALUE. (NOTE) Elevated high sensitivity troponin I (hsTnI) values and significant  changes across serial measurements may suggest ACS but many other  chronic and acute conditions are  known to elevate hsTnI results.  Refer to the Links section for chest pain algorithms and additional  guidance. Performed at [facility], [place] [address], [place], [place] [address]   Heparin level (unfractionated)     Status: Abnormal   Collection Time: 06/29/19  2:12 AM  Result Value Ref Range   Heparin Unfractionated <0.10 (L) 0.30 - 0.70 IU/mL    Comment: (NOTE) If heparin results are below expected values, and patient dosage has  been confirmed, suggest follow up testing of antithrombin III levels. Performed at [facility], [place] [address], [place], [place] [address]   Rapid urine drug screen (hospital performed)     Status: Abnormal   Collection Time: 06/29/19  4:21 AM  Result Value Ref Range   Opiates NONE DETECTED NONE DETECTED   Cocaine POSITIVE (A) NONE DETECTED   Benzodiazepines NONE DETECTED NONE DETECTED   Amphetamines NONE DETECTED NONE DETECTED   Tetrahydrocannabinol NONE DETECTED NONE DETECTED   Barbiturates NONE DETECTED NONE DETECTED    Comment: (NOTE) DRUG SCREEN FOR MEDICAL PURPOSES ONLY.  IF CONFIRMATION IS NEEDED FOR ANY PURPOSE, NOTIFY LAB WITHIN 5 DAYS. LOWEST DETECTABLE LIMITS FOR URINE DRUG SCREEN Drug Class                     Cutoff (ng/mL) Amphetamine and metabolites    1000 Barbiturate and metabolites    200 Benzodiazepine                 858 Tricyclics and metabolites     300 Opiates and metabolites        300 Cocaine and metabolites        300 THC                            50 Performed at [facility], [place] [address], [place],  [place] [address]   Urinalysis, Routine w reflex microscopic     Status: Abnormal   Collection Time: 06/29/19  4:21 AM  Result Value Ref Range   Color, Urine YELLOW YELLOW   APPearance HAZY (A) CLEAR   Specific Gravity, Urine 1.024 1.005 - 1.030   pH 5.0 5.0 - 8.0   Glucose, UA NEGATIVE NEGATIVE mg/dL   Hgb urine dipstick SMALL (A) NEGATIVE   Bilirubin Urine NEGATIVE NEGATIVE   Ketones, ur NEGATIVE NEGATIVE mg/dL   Protein, ur NEGATIVE NEGATIVE mg/dL   Nitrite NEGATIVE NEGATIVE   Leukocytes,Ua NEGATIVE NEGATIVE   RBC / HPF 0-5 0 - 5 RBC/hpf   WBC, UA 0-5 0 - 5 WBC/hpf   Bacteria, UA RARE (A) NONE SEEN   Squamous Epithelial / LPF 0-5 0 - 5   Mucus PRESENT    Hyaline Casts, UA PRESENT     Comment: Performed at [place] [address] [place], [place] [address]  Heparin level (unfractionated)     Status: Abnormal   Collection Time: 06/29/19  7:47 AM  Result Value Ref Range   Heparin Unfractionated <0.10 (L) 0.30 - 0.70 IU/mL    Comment: (NOTE) If heparin results are below expected values, and patient dosage has  been confirmed, suggest follow up testing of antithrombin III levels. Performed at [facility], [place] [address] [place], [place] [address]   APTT     Status: None   Collection Time: 06/29/19  7:47 AM  Result Value Ref Range   aPTT 30 24 -  36 seconds    Comment: Performed at [facility], [address], [place], [place] [address]    ECG   Sinus rhythm at 83, PAC's, inferior Q waves - Personally Reviewed  Telemetry   Sinus rhythm - Personally Reviewed  Radiology    DG Chest 2 View  Result Date: 06/29/2019 CLINICAL DATA:  Weakness.  Near syncope. EXAM: CHEST - 2 VIEW COMPARISON:  Dec 16, 2017 FINDINGS: The heart size remains enlarged. The patient is status post prior median sternotomy. Aortic calcifications are noted. There is no acute osseous abnormality. IMPRESSION: No active cardiopulmonary disease. Electronically Signed   By: Katherine Mantle M.D.   On: 06/29/2019 00:28   ECHOCARDIOGRAM COMPLETE  Result Date: 06/29/2019   ECHOCARDIOGRAM REPORT   Patient Name:   BRENLEE KOSKELA Date of Exam: 06/29/2019 Medical Rec #:  [id number]    Height:       62.5 in Accession #:    [id number]   Weight:       145.0 lb Date of Birth:  April 19, 1961    BSA:          1.68 m Patient Age:    58 years     BP:           117/98 mmHg Patient Gender: F            HR:           72 bpm. Exam Location:  Inpatient Procedure: 2D Echo Indications:    Syncope 780.2/R55  History:        Patient has prior history of Echocardiogram examinations, most                 recent 12/07/2017. CHF, CAD and Previous Myocardial Infarction,                 Prior CABG, Arrythmias:Atrial Fibrillation and Atrial Flutter;                 Risk Factors:Hypertension and Dyslipidemia. Ischemic                 cardiomyopathy.l.  Sonographer:    Ross Ludwig RDCS (AE) Referring Phys: Maricela Curet [facility] IMPRESSIONS  1. Left ventricular ejection fraction, by visual estimation, is 35 to 40%. The left ventricle has normal function. There is moderately increased left ventricular hypertrophy.  2. The left ventricle has no regional wall motion abnormalities.  3. Global right ventricle has mildly reduced systolic function.The right ventricular size is normal. No increase in right ventricular wall thickness.  4. Left atrial size was mildly dilated.  5. Right atrial size was normal.  6. Moderate mitral annular calcification.  7. The mitral valve is grossly normal. Mild mitral valve regurgitation.  8. The tricuspid valve is grossly normal. Tricuspid valve regurgitation is not demonstrated.  9. Aortic valve regurgitation is mild to moderate. 10. The aortic valve is normal in structure. Aortic valve regurgitation is mild to moderate. 11. The pulmonic valve was grossly normal. Pulmonic valve regurgitation is not visualized. 12. The atrial septum is grossly normal. FINDINGS  Left Ventricle: Left ventricular ejection  fraction, by visual estimation, is 35 to 40%. The left ventricle has normal function. The left ventricle has no regional wall motion abnormalities. There is moderately increased left ventricular hypertrophy. Right Ventricle: The right ventricular size is normal. No increase in right ventricular wall thickness. Global RV systolic function is has mildly reduced systolic function. Left Atrium: Left atrial size was mildly dilated. Right  Atrium: Right atrial size was normal in size Pericardium: There is no evidence of pericardial effusion. Mitral Valve: The mitral valve is grossly normal. There is mild thickening of the mitral valve leaflet(s). Moderate mitral annular calcification. Mild mitral valve regurgitation. Tricuspid Valve: The tricuspid valve is grossly normal. Tricuspid valve regurgitation is not demonstrated. Aortic Valve: The aortic valve is normal in structure. Aortic valve regurgitation is mild to moderate. Aortic regurgitation PHT measures 489 msec. Pulmonic Valve: The pulmonic valve was grossly normal. Pulmonic valve regurgitation is not visualized. Pulmonic regurgitation is not visualized. Aorta: The aortic root and ascending aorta are structurally normal, with no evidence of dilitation. IAS/Shunts: The atrial septum is grossly normal.  LEFT VENTRICLE PLAX 2D LVIDd:         4.80 cm LVIDs:         4.20 cm LV PW:         1.40 cm LV IVS:        1.60 cm LVOT diam:     2.00 cm LV SV:         29 ml LV SV Index:   16.84 LVOT Area:     3.14 cm  LV Volumes (MOD) LV area d, A2C:    43.70 cm LV area d, A4C:    36.50 cm LV area s, A2C:    31.10 cm LV area s, A4C:    26.70 cm LV major d, A2C:   9.51 cm LV major d, A4C:   8.33 cm LV major s, A2C:   7.98 cm LV major s, A4C:   7.34 cm LV vol d, MOD A2C: 172.0 ml LV vol d, MOD A4C: 131.0 ml LV vol s, MOD A2C: 108.0 ml LV vol s, MOD A4C: 80.5 ml LV SV MOD A2C:     64.0 ml LV SV MOD A4C:     131.0 ml LV SV MOD BP:      63.8 ml RIGHT VENTRICLE             IVC RV Basal  diam:  3.60 cm     IVC diam: 1.90 cm RV Mid diam:    1.90 cm RV S prime:     10.20 cm/s TAPSE (M-mode): 1.3 cm LEFT ATRIUM             Index       RIGHT ATRIUM           Index LA diam:        4.20 cm 2.50 cm/m  RA Area:     17.60 cm LA Vol (A2C):   51.6 ml 30.76 ml/m RA Volume:   54.40 ml  32.43 ml/m LA Vol (A4C):   66.7 ml 39.76 ml/m LA Biplane Vol: 61.7 ml 36.78 ml/m  AORTIC VALVE LVOT Vmax:   108.80 cm/s LVOT Vmean:  67.760 cm/s LVOT VTI:    0.216 m AI PHT:      489 msec  AORTA Ao Root diam: 2.80 cm Ao Asc diam:  3.30 cm  SHUNTS Systemic VTI:  0.22 m Systemic Diam: 2.00 cm  Kristeen Miss MD Electronically signed by Kristeen Miss MD Signature Date/Time: 06/29/2019/12:12:41 PM    Final     Cardiac Studies   Procedure: 2D Echo  Indications:    Syncope 780.2/R55   History:        Patient has prior history of Echocardiogram examinations, most                 recent 12/07/2017.  CHF, CAD and Previous Myocardial Infarction,                 Prior CABG, Arrythmias:Atrial Fibrillation and Atrial Flutter;                 Risk Factors:Hypertension and Dyslipidemia. Ischemic                 cardiomyopathy.l.   Sonographer:    Ross Ludwig RDCS (AE) Referring Phys: Maricela Curet [facility]  IMPRESSIONS    1. Left ventricular ejection fraction, by visual estimation, is 35 to 40%. The left ventricle has normal function. There is moderately increased left ventricular hypertrophy.  2. The left ventricle has no regional wall motion abnormalities.  3. Global right ventricle has mildly reduced systolic function.The right ventricular size is normal. No increase in right ventricular wall thickness.  4. Left atrial size was mildly dilated.  5. Right atrial size was normal.  6. Moderate mitral annular calcification.  7. The mitral valve is grossly normal. Mild mitral valve regurgitation.  8. The tricuspid valve is grossly normal. Tricuspid valve regurgitation is not demonstrated.  9. Aortic valve  regurgitation is mild to moderate. 10. The aortic valve is normal in structure. Aortic valve regurgitation is mild to moderate. 11. The pulmonic valve was grossly normal. Pulmonic valve regurgitation is not visualized. 12. The atrial septum is grossly normal.  Assessment   Principal Problem:   Vasovagal syncope   Plan   Ms. Salazar has improved and I suspect had vasovagal syncope. There are some ectopic beats on her EKG and it is feasible she may have had arrhythmia related to low EF. Would recommend an outpatient 30-day monitor that we will arrange as well as follow-up appt with Dr. Katrinka Blazing or APP in the [address] office. Ok for d/c home from the ER.  Time Spent Directly with Patient:  I have spent a total of 25 minutes with the patient reviewing hospital notes, telemetry, EKGs, labs and examining the patient as well as establishing an assessment and plan that was discussed personally with the patient.  > 50% of time was spent in direct patient care.  Length of Stay:  LOS: 0 days   Chrystie Nose, MD, [facility], FACP  [place]  [facility]  Medical Director of the Advanced Lipid Disorders &  Cardiovascular Risk Reduction Clinic Diplomate of the American Board of Clinical Lipidology Attending Cardiologist  Direct Dial: [phone]  Fax: [phone]  Website:  [email or web address] Nicely Lysandra Loughmiller 06/29/2019, 4:05 PM

## 2019-06-29 NOTE — Progress Notes (Signed)
   Chart reviewed - agree with Dr. Aubery Lapping assessment and plan - will review echo today, if there are no new significant changes, likely ok for d/c later today.  Pixie Casino, MD, [facility], [place] Director of the Advanced Lipid Disorders &  Cardiovascular Risk Reduction Clinic Diplomate of the American Board of Clinical Lipidology Attending Cardiologist  Direct Dial: [phone]  Fax: [phone]  Website:  [email or web address]

## 2019-06-29 NOTE — Progress Notes (Signed)
ANTICOAGULATION CONSULT NOTE - Initial Consult  Pharmacy Consult for Heparin Indication: chest pain/ACS  Allergies  Allergen Reactions  . Other Itching    GEL ON ELECTRODES FOR EKG  . Adhesive [Tape]     Patient Measurements: Weight: 145 lb (65.8 kg) Heparin Dosing Weight: TBW  Vital Signs: Temp: 97.5 F (36.4 C) (12/10 2246) Temp Source: Oral (12/10 2246) BP: 102/65 (12/10 2235) Pulse Rate: 82 (12/10 2235)  Labs: Recent Labs    06/29/19 0004  HGB 14.3  HCT 44.3  PLT 201  CREATININE 1.29*  TROPONINIHS 238*    Estimated Creatinine Clearance: 41.8 mL/min (A) (by C-G formula based on SCr of 1.29 mg/dL (H)).   Medical History: Past Medical History:  Diagnosis Date  . Atrial fibrillation ([place])   . Atrial flutter ([place])   . CHF (congestive heart failure) ([place])   . Coronary atherosclerosis of native coronary artery   . HTN (hypertension)   . Hx of CABG    LIMA-LAD, SVG-DIAG, SVG-OM, SVG-RCA  . Hypercholesteremia   . Myocardial infarct ([place]) 2007  . NSTEMI (non-ST elevated myocardial infarction) ([place]) 10/13/2015  . Obesity    Assessment: 72 YOF presenting with generalized weakness, with concern for ACS.  Hx of AFib on Eliquis 5mg  BID, last dose possibly taken ~2 days ago per pt report, will get baseline anti-Xa and not bolus at this time d/t uncertainty of last dose.      Goal of Therapy:  Heparin level 0.3-0.7 units/ml aPTT 66-102 seconds Monitor platelets by anticoagulation protocol: Yes   Plan:  Start heparin gtt at 750 units/hr F/u 6 hour aPTT/heparin level, BL anti-Xa  Bertis Ruddy, PharmD Clinical Pharmacist Please check AMION for all [place] numbers 06/29/2019 2:08 AM

## 2019-06-29 NOTE — ED Notes (Signed)
Patient verbalizes understanding of discharge instructions. Opportunity for questioning and answers were provided. Armband removed by staff, pt discharged from ED ambulatory.   

## 2019-07-02 ENCOUNTER — Telehealth: Payer: Self-pay | Admitting: *Deleted

## 2019-07-02 ENCOUNTER — Other Ambulatory Visit: Payer: Self-pay | Admitting: *Deleted

## 2019-07-02 DIAGNOSIS — R002 Palpitations: Secondary | ICD-10-CM

## 2019-07-02 NOTE — Telephone Encounter (Signed)
[employer] to ship a 30 day cardiac event monitor to the patients home.  Instructions will be included in the monitor kit. 

## 2019-07-04 NOTE — Progress Notes (Deleted)
CARDIOLOGY OFFICE NOTE  Date:  07/04/2019    [name] Date of Birth: Aug 21, 1960 Medical Record #[id number]  PCP:  Georgianne Fickamachandran, Ajith, MD  Cardiologist:  Tyrone SageGerhardt & ***    No chief complaint on file.   History of Present Illness: [name] is a 58 y.o. female who presents today for a *** with a hx of CAD, prior LAD Promus DES 2003 and ultimately CABG with MV repair in 2007 for CHF related to multivessel CAD and MR.SVG to RCA DES 09/2015. Most recent LVEF 35-40%).  PAD involving left lower extremity.  Candice LundJanet denies cardiac symptoms.  Specifically, she denies angina, dyspnea on exertion, orthopnea, lower extremity swelling, palpitations, and syncope.  No significant lower extremity swelling.  No lightheadedness or dizziness.  She does have discomfort in the left lower extremity when she walks. The patient {does/does not:200015} have symptoms concerning for COVID-19 infection (fever, chills, cough, or new shortness of breath).   Comes in today. Here with   Past Medical History:  Diagnosis Date  . Atrial fibrillation (HCC)   . Atrial flutter (HCC)   . CHF (congestive heart failure) (HCC)   . Coronary atherosclerosis of native coronary artery   . HTN (hypertension)   . Hx of CABG    LIMA-LAD, SVG-DIAG, SVG-OM, SVG-RCA  . Hypercholesteremia   . Myocardial infarct (HCC) 2007  . NSTEMI (non-ST elevated myocardial infarction) (HCC) 10/13/2015  . Obesity     Past Surgical History:  Procedure Laterality Date  . ABDOMINAL HYSTERECTOMY    . CARDIAC CATHETERIZATION N/A 10/14/2015   Procedure: Left Heart Cath and Cors/Grafts Angiography;  Surgeon: Marykay Lexavid W Harding, MD;  [facility];  Service: Cardiovascular;  Laterality: N/A;  . CARDIAC CATHETERIZATION N/A 10/14/2015   Procedure: Coronary Stent Intervention;  Surgeon: Marykay Lexavid W Harding, MD;  [facility];  Service: Cardiovascular;  Laterality: N/A;  . CESAREAN SECTION  1985  . CORONARY  ANGIOPLASTY WITH STENT PLACEMENT  2003   LAD Promus DES [name]/notes 10/13/2015  . CORONARY ARTERY BYPASS GRAFT  03/2006   "CABG X3"  . LEFT HEART CATH AND CORS/GRAFTS ANGIOGRAPHY N/A 12/09/2017   Procedure: LEFT HEART CATH AND CORS/GRAFTS ANGIOGRAPHY;  Surgeon: Marykay LexHarding, David W, MD;  [facility];  Service: Cardiovascular;  Laterality: N/A;  . MITRAL VALVE REPAIR     [name]/notes 10/13/2015  . TEE WITHOUT CARDIOVERSION N/A 12/07/2017   Procedure: TRANSESOPHAGEAL ECHOCARDIOGRAM (TEE);  Surgeon: [name], Kenneth C, MD;  [facility];  Service: Cardiovascular;  Laterality: N/A;     Medications: No outpatient medications have been marked as taking for the 07/09/19 encounter (Appointment) with [name], Luca Burston C, NP.     Allergies: Allergies  Allergen Reactions  . Other Itching    GEL ON ELECTRODES FOR EKG  . Adhesive [Tape]     Social History: The patient  reports that she has quit smoking. Her smoking use included cigarettes. She has a 30.00 pack-year smoking history. She has never used smokeless tobacco. She reports current alcohol use of about 1.0 standard drinks of alcohol per week. She reports that she does not use drugs.   Family History: The patient's ***family history includes Hypertension in her brother, father, and sister; Stroke in her sister.   Review of Systems: Please see the history of present illness.   All other systems are reviewed and negative.   Physical Exam: VS:  There were no vitals taken for this visit. [name].  BMI There  is no height or weight on file to calculate BMI.  Wt Readings from Last 3 Encounters:  06/29/19 145 lb (65.8 kg)  05/25/19 161 lb (73 kg)  03/06/19 157 lb 12.8 oz (71.6 kg)    General: Pleasant. Well developed, well nourished and in no acute distress.   HEENT: Normal.  Neck: Supple, no JVD, carotid bruits, or masses noted.  Cardiac: ***Regular rate and rhythm. No murmurs, rubs, or gallops. No edema.  Respiratory:  Lungs are clear  to auscultation bilaterally with normal work of breathing.  GI: Soft and nontender.  MS: No deformity or atrophy. Gait and ROM intact.  Skin: Warm and dry. Color is normal.  Neuro:  Strength and sensation are intact and no gross focal deficits noted.  Psych: Alert, appropriate and with normal affect.   LABORATORY DATA:  EKG:  EKG {ACTION; IS/IS ZCH:[id number]} ordered today. This demonstrates ***.  Lab Results  Component Value Date   WBC 6.7 06/29/2019   HGB 14.3 06/29/2019   HCT 44.3 06/29/2019   PLT 201 06/29/2019   GLUCOSE 134 (H) 06/29/2019   CHOL 123 12/04/2017   TRIG 89 12/04/2017   HDL 32 (L) 12/04/2017   LDLCALC 73 12/04/2017   ALT 14 06/29/2019   AST 21 06/29/2019   NA 141 06/29/2019   K 3.4 (L) 06/29/2019   CL 104 06/29/2019   CREATININE 1.29 (H) 06/29/2019   BUN 16 06/29/2019   CO2 26 06/29/2019   TSH 0.721 10/13/2015   INR 1.49 12/09/2017   HGBA1C 5.9 (H) 12/05/2017     BNP (last 3 results) No results for input(s): BNP in the last 8760 hours.  ProBNP (last 3 results) No results for input(s): PROBNP in the last 8760 hours.   Other Studies Reviewed Today:   ASSESSMENT & PLAN:    1. Chronic systolic dysfunction of left ventricle   2. Coronary artery disease involving coronary bypass graft of native heart with angina pectoris (HCC)   3. Paroxysmal atrial fibrillation (HCC)   4. Claudication (HCC)   5. Essential hypertension   6. Educated about COVID-19 virus infection    PLAN:    In order of problems listed above:  1. Currently on good heart failure therapy.  This includes Entresto and Toprol-XL.  Also taking Lanoxin. 2. Secondary prevention discussed 3. Lanoxin is used for rate control when atrial fibrillation occurs.  Given first-degree AV block noted in August, we should consider possibly discontinuing Lanoxin. 4. Being seen by Dr. Allyson Sabal and will have an upcoming invasive vascular evaluation.  Secondary prevention also  discussed 5. Relatively low blood pressure.  Continue current medication regimen as the patient is asymptomatic. 6. 3W's were discussed and endorsed by the patient  Overall education and awareness concerning primary/secondary risk prevention was discussed in detail: LDL less than 70, hemoglobin A1c less than 7, blood pressure target less than 130/80 mmHg, >150 minutes of moderate aerobic activity per week, avoidance of smoking, weight control (via diet and exercise), and continued surveillance/management of/for obstructive sleep apnea.   [name] COVID-19 Education: The signs and symptoms of COVID-19 were discussed with the patient and how to seek care for testing (follow up with PCP or arrange E-visit).  The importance of social distancing, staying at home, hand hygiene and wearing a mask when out in public were discussed today.  Current medicines are reviewed with the patient today.  The patient does not have concerns regarding medicines other than what has been noted above.  The following changes  have been made:  See above.  Labs/ tests ordered today include:   No orders of the defined types were placed in this encounter.    Disposition:   FU with *** in {gen number 3-73:428768} {Days to years:10300}.   Patient is agreeable to this plan and will call if any problems develop in the interim.   SignedTruitt Merle, NP  07/04/2019 7:58 AM  [facility] [address] [place], [place]  [address] Phone: [phone] Fax: [phone]

## 2019-07-08 ENCOUNTER — Encounter: Payer: Self-pay | Admitting: Interventional Cardiology

## 2019-07-08 ENCOUNTER — Ambulatory Visit (INDEPENDENT_AMBULATORY_CARE_PROVIDER_SITE_OTHER): Payer: No Typology Code available for payment source

## 2019-07-08 DIAGNOSIS — R002 Palpitations: Secondary | ICD-10-CM

## 2019-07-09 ENCOUNTER — Ambulatory Visit: Payer: No Typology Code available for payment source | Admitting: Nurse Practitioner

## 2019-07-11 ENCOUNTER — Telehealth: Payer: Self-pay | Admitting: Internal Medicine

## 2019-07-11 NOTE — Telephone Encounter (Signed)
Received a call from [employer] that [name] had a 23 second run on non-sustained VT around 4:30am this morning. She has not answered her calls from [employer] nor answered my phone calls. I did confirm with [employer] that she is in normal sinus rhythm now. Will continue to try and reach her.  Alric Quan, MD

## 2019-07-11 NOTE — Telephone Encounter (Signed)
Noted.  We will continue to monitor. Have patient notify us of any dizziness, palpitations, or lightheadedness.

## 2019-07-13 ENCOUNTER — Telehealth: Payer: Self-pay | Admitting: Physician Assistant

## 2019-07-13 NOTE — Telephone Encounter (Signed)
   [employer] called because pt had 9 bt run NSVT, no sx reported.  Pt previously had 23 sec run NSVT, but no one had been able to reach the patient.  I contacted the patient, who said she was doing fine.   She denied any presyncope or syncope. No palpitations, no sx at all.   She is under stress from her husband being in the hospital, but says she is doing ok.  Advised her to call 911 or [place] if she gets any palpitations or feels like she is going to pass out. Pt agreed.   Rosaria Ferries, PA-C 07/13/2019 11:57 AM [facility] [phone]

## 2019-08-09 NOTE — Progress Notes (Deleted)
CARDIOLOGY OFFICE NOTE  Date:  08/14/2019    [name] Date of Birth: 1960-08-27 Medical Record #[id number]  PCP:  Georgianne Fick, MD  Cardiologist:  Princella Ion chief complaint on file.   History of Present Illness: [name] is a 59 y.o. female who presents today for a follow up visit. Seen for Dr. Katrinka Blazing.   She has a history of CAD, prior LAD Promus DES in 2003 and ultimate CABG with MV repair in 2007 for CHF related to multivessel CAD and MR.Underwent DES to SVG to RCA in 09/2015. Most recent LVEF 35-40%.  Does have PAD involving left lower extremity.Other issues include AF, HLD, HTN.   Last seen in November by Dr. Katrinka Blazing and was felt to be doing ok.   Recently admitted with acute onset generalized weakness. She had been in the ER with her husband who was the patient. She had eaten in the cafeteria and then got lightheaded,  sweaty, mildly Thomas of breath and nauseated.  She laid on the floor and likely had a syncopal event as the RN reports patient urinated on herself.  Patient was assisted to the bathroom where she had a bowel movement and felt better. Symptoms resolved. She was very anxious about her husband's symptoms (altered mental status).   Noted to have had 2 positive troponins - drug screen + for cocaine - had echo - apparently had consultation with Dr. Rennis Golden who advised outpatient follow up. He felt this episode was very suspicious for vasovagal syncope. 30 day monitor was to be arranged.   The patient {does/does not:200015} have symptoms concerning for COVID-19 infection (fever, chills, cough, or new shortness of breath).   Comes in today. Here with   Past Medical History:  Diagnosis Date  . Atrial fibrillation (HCC)   . Atrial flutter (HCC)   . CHF (congestive heart failure) (HCC)   . Coronary atherosclerosis of native coronary artery   . HTN (hypertension)   . Hx of CABG    LIMA-LAD, SVG-DIAG, SVG-OM, SVG-RCA  . Hypercholesteremia   . Myocardial  infarct (HCC) 2007  . NSTEMI (non-ST elevated myocardial infarction) (HCC) 10/13/2015  . Obesity     Past Surgical History:  Procedure Laterality Date  . ABDOMINAL HYSTERECTOMY    . CARDIAC CATHETERIZATION N/A 10/14/2015   Procedure: Left Heart Cath and Cors/Grafts Angiography;  Surgeon: Marykay Lex, MD;  Location: [facility] INVASIVE CV LAB;  Service: Cardiovascular;  Laterality: N/A;  . CARDIAC CATHETERIZATION N/A 10/14/2015   Procedure: Coronary Stent Intervention;  Surgeon: Marykay Lex, MD;  Location: [facility] INVASIVE CV LAB;  Service: Cardiovascular;  Laterality: N/A;  . CESAREAN SECTION  1985  . CORONARY ANGIOPLASTY WITH STENT PLACEMENT  2003   LAD Promus DES [name] 10/13/2015  . CORONARY ARTERY BYPASS GRAFT  03/2006   "CABG X3"  . LEFT HEART CATH AND CORS/GRAFTS ANGIOGRAPHY N/A 12/09/2017   Procedure: LEFT HEART CATH AND CORS/GRAFTS ANGIOGRAPHY;  Surgeon: Marykay Lex, MD;  Location: [facility] INVASIVE CV LAB;  Service: Cardiovascular;  Laterality: N/A;  . MITRAL VALVE REPAIR     [name] 10/13/2015  . TEE WITHOUT CARDIOVERSION N/A 12/07/2017   Procedure: TRANSESOPHAGEAL ECHOCARDIOGRAM (TEE);  Surgeon: Chrystie Nose, MD;  Location: [facility] ENDOSCOPY;  Service: Cardiovascular;  Laterality: N/A;     Medications: No outpatient medications have been marked as taking for the 08/15/19 encounter (Appointment) with [name], NP.     Allergies: Allergies  Allergen Reactions  . Other  Itching    GEL ON ELECTRODES FOR EKG  . Adhesive [Tape]     Social History: The patient  reports that she has quit smoking. Her smoking use included cigarettes. She has a 30.00 pack-year smoking history. She has never used smokeless tobacco. She reports current alcohol use of about 1.0 standard drinks of alcohol per week. She reports that she does not use drugs.   Family History: The patient's ***family history includes Hypertension in her brother, father, and sister; Stroke in her sister.   Review of  Systems: Please see the history of present illness.   All other systems are reviewed and negative.   Physical Exam: VS:  There were no vitals taken for this visit. [name]  BMI There is no height or weight on file to calculate BMI.  Wt Readings from Last 3 Encounters:  06/29/19 145 lb (65.8 kg)  05/25/19 161 lb (73 kg)  03/06/19 157 lb 12.8 oz (71.6 kg)    General: Pleasant. Well developed, well nourished and in no acute distress.   HEENT: Normal.  Neck: Supple, no JVD, carotid bruits, or masses noted.  Cardiac: ***Regular rate and rhythm. No murmurs, rubs, or gallops. No edema.  Respiratory:  Lungs are clear to auscultation bilaterally with normal work of breathing.  GI: Soft and nontender.  MS: No deformity or atrophy. Gait and ROM intact.  Skin: Warm and dry. Color is normal.  Neuro:  Strength and sensation are intact and no gross focal deficits noted.  Psych: Alert, appropriate and with normal affect.   LABORATORY DATA:  EKG:  EKG {ACTION; IS/IS HYW:[id number]} ordered today. This demonstrates ***.  Lab Results  Component Value Date   WBC 6.7 06/29/2019   HGB 14.3 06/29/2019   HCT 44.3 06/29/2019   PLT 201 06/29/2019   GLUCOSE 134 (H) 06/29/2019   CHOL 123 12/04/2017   TRIG 89 12/04/2017   HDL 32 (L) 12/04/2017   LDLCALC 73 12/04/2017   ALT 14 06/29/2019   AST 21 06/29/2019   NA 141 06/29/2019   K 3.4 (L) 06/29/2019   CL 104 06/29/2019   CREATININE 1.29 (H) 06/29/2019   BUN 16 06/29/2019   CO2 26 06/29/2019   TSH 0.721 10/13/2015   INR 1.49 12/09/2017   HGBA1C 5.9 (H) 12/05/2017       BNP (last 3 results) No results for input(s): BNP in the last 8760 hours.  ProBNP (last 3 results) No results for input(s): PROBNP in the last 8760 hours.   Other Studies Reviewed Today:  ECHO IMPRESSIONS 06/2019    1. Left ventricular ejection fraction, by visual estimation, is 35 to 40%. The left ventricle has normal function. There is moderately increased left  ventricular hypertrophy.  2. The left ventricle has no regional wall motion abnormalities.  3. Global right ventricle has mildly reduced systolic function.The right ventricular size is normal. No increase in right ventricular wall thickness.  4. Left atrial size was mildly dilated.  5. Right atrial size was normal.  6. Moderate mitral annular calcification.  7. The mitral valve is grossly normal. Mild mitral valve regurgitation.  8. The tricuspid valve is grossly normal. Tricuspid valve regurgitation is not demonstrated.  9. Aortic valve regurgitation is mild to moderate. 10. The aortic valve is normal in structure. Aortic valve regurgitation is mild to moderate. 11. The pulmonic valve was grossly normal. Pulmonic valve regurgitation is not visualized. 12. The atrial septum is grossly normal.   LEFT HEART CATH AND CORS/GRAFTS ANGIOGRAPHY [phone]  Conclusion    Prox Cx lesion is 70% stenosed. Ost 1st Mrg to 1st Mrg lesion is 65% stenosed.  Prox Cx to Mid Cx lesion is 100% stenosed - after 1st Mrg  SVG-2nd Mrg: Origin lesion is 100% stenosed. Known CTO.  Prox LAD lesion is 75% stenosed pre-2nd Diag. Prox LAD to Mid LAD lesion is 100% stenosed AFTER 2nd Diag & SP2 (SP1 & SP2 fill rPDA with faint collaterals)  LIMA-LAD graft was visualized by angiography. The graft exhibits no disease.  Ost 2nd Diag lesion is 100% stenosed.  SVG-Diag2 graft was visualized by angiography and is normal in caliber. The graft exhibits no disease --collateral branches fill 2nd Mrg into the distal Cx.  Prox RCA to Dist RCA lesion is 100% stenosed - known CTO.  NEW: Seq SVG- RPAV-dRCA graft was visualized by angiography and is large. Prox Graft to Mid Graft lesion before Acute Mrg is 100% stenosed.  LV end diastolic pressure is mildly elevated.   Newly found ostial occlusion of likely sequential SVG-AM-dRCA (proximal to stent from March 2017).  This is in addition to known CTO of SVG-OM 2 Severe native  disease with occluded mid RCA, mid circumflex and mid LAD as well as Diag2 Now 2 out of 4-5 grafts remain patent -SVG-Diag2 is patent with collaterals filling OM 2 and distal Circumflex, LIMA-LAD is patent.  Suspect mild troponin elevation was related to new onset A. fib RVR with ischemic cardiomyopathy and severe existing CAD.  Plan: Return to nursing unit with TR band removal per protocol. Restart oral anticoagulation (Eliquis) 6 hours after TR band removal Continue CHF management.   Glenetta Hew, M.D., M.S.     ASSESSMENT & PLAN:    1. Syncope - felt to be vasovagal - has monitor  2. + Troponin - + drug screen for cocaine - discussed with Dr. Tamala Julian on 08/14/19 - he would favor continued medical therapy and cessation of drug use.   3. CAD  4. Chronic combined systolic and diastolic HF  5. PAF  6. HTN   PLAN:    In order of problems listed above:  1. Currently on good heart failure therapy.  This includes Entresto and Toprol-XL.  Also taking Lanoxin. 2. Secondary prevention discussed 3. Lanoxin is used for rate control when atrial fibrillation occurs.  Given first-degree AV block noted in August, we should consider possibly discontinuing Lanoxin. 4. Being seen by Dr. Gwenlyn Found and will have an upcoming invasive vascular evaluation.  Secondary prevention also discussed 5. Relatively low blood pressure.  Continue current medication regimen as the patient is asymptomatic. 6. 3W's were discussed and endorsed by the patient  . COVID-19 Education: The signs and symptoms of COVID-19 were discussed with the patient and how to seek care for testing (follow up with PCP or arrange E-visit).  The importance of social distancing, staying at home, hand hygiene and wearing a mask when out in public were discussed today.  Current medicines are reviewed with the patient today.  The patient does not have concerns regarding medicines other than what has been noted above.  The  following changes have been made:  See above.  Labs/ tests ordered today include:   No orders of the defined types were placed in this encounter.    Disposition:   FU with *** in {gen number 0-62:[id number]} {Days to years:10300}.   Patient is agreeable to this plan and will call if any problems develop in the interim.   Signed: Truitt Merle, NP  08/14/2019 9:51 AM  [facility] [address] [place], [place]  [address] Phone: [phone] Fax: [phone]

## 2019-08-13 ENCOUNTER — Ambulatory Visit: Payer: No Typology Code available for payment source | Admitting: Nurse Practitioner

## 2019-08-15 ENCOUNTER — Ambulatory Visit: Payer: No Typology Code available for payment source | Admitting: Nurse Practitioner

## 2019-08-23 ENCOUNTER — Other Ambulatory Visit: Payer: Self-pay | Admitting: Medical

## 2019-08-23 NOTE — Telephone Encounter (Signed)
Eliquis 5mg  refill request received. Pt is 59 years old, weight-65.8kg, Crea-1.29 on 06/29/2019, Diagnosis-Afib, and last seen by Dr. 14/05/2019 on 05/25/2019. Dose is appropriate based on dosing criteria. Will send in refill to requested pharmacy.

## 2019-09-12 NOTE — Progress Notes (Signed)
Cardiology Office Note:    Date:  09/13/2019   ID:  Ether Goebel, DOB 08-Mar-1961, MRN [id number]  PCP:  Merrilee Seashore, MD  Cardiologist:  Sinclair Grooms, MD   Referring MD: Merrilee Seashore, MD   Chief Complaint  Patient presents with  . Coronary Artery Disease  . Congestive Heart Failure  . Hypertension    History of Present Illness:    [name] is a 59 y.o. female with a hx of CAD, prior LAD Promus DES 2003 and ultimately CABG with MV repair in 2007 for CHF related to multivessel CAD and MR.SVG to RCA DES 09/2015. Most recent LVEF 35-40%).  PAD involving left lower extremity.  She is doing well.  This is a post emergency room visit for syncope in December.  She explains that she was at the hospital with her husband who had a stroke.  She was stressed out related to it.  She developed warmth, weakness, nausea, and eventually fainted.  She was worked up and had a mild elevation in troponin but no symptoms to suggest ischemia/angina.  She has done well since then.  She denies orthopnea, PND, chest pain, and recurrent syncope.  She is compliant with her medications.  She feels back to normal.  Her husband is now home but has a long way to progress to be functional and independent.  He has aphasia and difficulty walking.  Past Medical History:  Diagnosis Date  . Atrial fibrillation ([place])   . Atrial flutter ([place])   . CHF (congestive heart failure) ([place])   . Coronary atherosclerosis of native coronary artery   . HTN (hypertension)   . Hx of CABG    LIMA-LAD, SVG-DIAG, SVG-OM, SVG-RCA  . Hypercholesteremia   . Myocardial infarct ([place]) 2007  . NSTEMI (non-ST elevated myocardial infarction) ([place]) 10/13/2015  . Obesity     Past Surgical History:  Procedure Laterality Date  . ABDOMINAL HYSTERECTOMY    . CARDIAC CATHETERIZATION N/A 10/14/2015   Procedure: Left Heart Cath and Cors/Grafts Angiography;  Surgeon: Leonie Man, MD;  Location: [facility];  Service:  Cardiovascular;  Laterality: N/A;  . CARDIAC CATHETERIZATION N/A 10/14/2015   Procedure: Coronary Stent Intervention;  Surgeon: Leonie Man, MD;  Location: [facility];  Service: Cardiovascular;  Laterality: N/A;  . CESAREAN SECTION  1985  . CORONARY ANGIOPLASTY WITH STENT PLACEMENT  2003   LAD Promus DES Archie Endo 10/13/2015  . CORONARY ARTERY BYPASS GRAFT  03/2006   "CABG X3"  . LEFT HEART CATH AND CORS/GRAFTS ANGIOGRAPHY N/A 12/09/2017   Procedure: LEFT HEART CATH AND CORS/GRAFTS ANGIOGRAPHY;  Surgeon: Leonie Man, MD;  Location: [facility];  Service: Cardiovascular;  Laterality: N/A;  . MITRAL VALVE REPAIR     Archie Endo 10/13/2015  . TEE WITHOUT CARDIOVERSION N/A 12/07/2017   Procedure: TRANSESOPHAGEAL ECHOCARDIOGRAM (TEE);  Surgeon: Pixie Casino, MD;  Location: [facility];  Service: Cardiovascular;  Laterality: N/A;    Current Medications: Current Meds  Medication Sig  . acetaminophen (TYLENOL) 500 MG tablet Take 1,000 mg by mouth as needed for mild pain.  [name] aspirin EC 81 MG tablet Take 1 tablet (81 mg total) by mouth daily.  [name] atorvastatin (LIPITOR) 80 MG tablet TAKE 1 TABLET BY MOUTH EVERY DAY  . digoxin (LANOXIN) 0.25 MG tablet TAKE 1 TABLET BY MOUTH DAILY. PLEASE MAKE OVERDUE APPT WITH DR. Tamala Julian BEFORE ANYMORE RF 1ST ATTEMPT  . ELIQUIS 5 MG TABS tablet TAKE 1 TABLET  BY MOUTH TWICE DAILY  . furosemide (LASIX) 40 MG tablet Take 1 tablet (40 mg total) by mouth daily.  . metoprolol succinate (TOPROL-XL) 100 MG 24 hr tablet TAKE 1 TABLET (100 MG TOTAL) BY MOUTH 2 (TWO) TIMES DAILY.  . nitroGLYCERIN (NITROSTAT) 0.4 MG SL tablet PLEASE SEE ATTACHED FOR DETAILED DIRECTIONS  . sacubitril-valsartan (ENTRESTO) 49-51 MG Take 1 tablet by mouth 2 (two) times daily.  [name] triamcinolone cream (KENALOG) 0.1 % Apply 1 application topically at bedtime.     Allergies:   Other and Adhesive [tape]   Social History   Socioeconomic History  . Marital status: Married    Spouse  name: Not on file  . Number of children: Not on file  . Years of education: Not on file  . Highest education level: Not on file  Occupational History  . Not on file  Tobacco Use  . Smoking status: Former Smoker    Packs/day: 1.00    Years: 30.00    Pack years: 30.00    Types: Cigarettes  . Smokeless tobacco: Never Used  . Tobacco comment: "quit smoking cigarettes in ~ 2007"  Substance and Sexual Activity  . Alcohol use: Yes    Alcohol/week: 1.0 standard drinks    Types: 1 Cans of beer per week    Comment: occ  . Drug use: No  . Sexual activity: Not Currently  Other Topics Concern  . Not on file  Social History Narrative  . Not on file   Social Determinants of Health   Financial Resource Strain:   . Difficulty of Paying Living Expenses: Not on file  Food Insecurity:   . Worried About Programme researcher, broadcasting/film/video in the Last Year: Not on file  . Ran Out of Food in the Last Year: Not on file  Transportation Needs:   . Lack of Transportation (Medical): Not on file  . Lack of Transportation (Non-Medical): Not on file  Physical Activity:   . Days of Exercise per Week: Not on file  . Minutes of Exercise per Session: Not on file  Stress:   . Feeling of Stress : Not on file  Social Connections:   . Frequency of Communication with Friends and Family: Not on file  . Frequency of Social Gatherings with Friends and Family: Not on file  . Attends Religious Services: Not on file  . Active Member of Clubs or Organizations: Not on file  . Attends Banker Meetings: Not on file  . Marital Status: Not on file     Family History: The patient's family history includes Hypertension in her brother, father, and sister; Stroke in her sister. There is no history of Heart attack.  ROS:   Please see the history of present illness.    Highly stressed because of her husband stroke.  All other systems reviewed and are negative.  EKGs/Labs/Other Studies Reviewed:    The following  studies were reviewed today: No new cardiac functional or imaging data.  EKG:  EKG EKG from that emergency room visit July 02, 2019 demonstrated T wave abnormality precordial and inferior leads unchanged from prior tracings.  Recent Labs: 06/29/2019: ALT 14; BUN 16; Creatinine, Ser 1.29; Hemoglobin 14.3; Platelets 201; Potassium 3.4; Sodium 141  Recent Lipid Panel    Component Value Date/Time   CHOL 123 12/04/2017 1536   CHOL 191 08/24/2017 1536   TRIG 89 12/04/2017 1536   HDL 32 (L) 12/04/2017 1536   HDL 58 08/24/2017 1536   CHOLHDL  3.8 12/04/2017 1536   VLDL 18 12/04/2017 1536   LDLCALC 73 12/04/2017 1536   LDLCALC 96 08/24/2017 1536    Physical Exam:    VS:  BP (!) 104/52   Pulse (!) 51   Ht 5' 2.5" (1.588 m)   Wt 159 lb 12.8 oz (72.5 kg)   SpO2 100%   BMI 28.76 kg/m     Wt Readings from Last 3 Encounters:  09/13/19 159 lb 12.8 oz (72.5 kg)  06/29/19 145 lb (65.8 kg)  05/25/19 161 lb (73 kg)     GEN: Mildly overweight. No acute distress HEENT: Normal NECK: No JVD. LYMPHATICS: No lymphadenopathy CARDIAC: S4 gallop RRR with 2/6 left lower sternal systolic murmur, but no S3gallop, or edema. VASCULAR:  Normal Pulses. No bruits. RESPIRATORY:  Clear to auscultation without rales, wheezing or rhonchi  ABDOMEN: Soft, non-tender, non-distended, No pulsatile mass, MUSCULOSKELETAL: No deformity  SKIN: Warm and dry NEUROLOGIC:  Alert and oriented x 3 PSYCHIATRIC:  Normal affect   ASSESSMENT:    1. Chronic systolic dysfunction of left ventricle   2. Coronary artery disease involving coronary bypass graft of native heart with angina pectoris (HCC)   3. Paroxysmal atrial fibrillation (HCC)   4. Essential hypertension   5. Educated about COVID-19 virus infection   6. Anticoagulation adequate    PLAN:    In order of problems listed above:  1. No evidence of volume overload.  Compliant with her current medical regimen.  On strong heart failure regimen including  Entresto, Toprol-XL, furosemide, Lanoxin all of which should be continued. 2. Secondary prevention discussed 3. Continue Eliquis to prevent stroke.  She is in sinus rhythm today based upon clinical auscultation. 4. Excellent blood pressure control. 5. She will take the COVID-19 vaccine when her return comes.  She is at high risk for severe disease and death. 6. Continue Eliquis.  Overall education and awareness concerning primary/secondary risk prevention was discussed in detail: LDL less than 70, hemoglobin A1c less than 7, blood pressure target less than 130/80 mmHg, >150 minutes of moderate aerobic activity per week, avoidance of smoking, weight control (via diet and exercise), and continued surveillance/management of/for obstructive sleep apnea.    Medication Adjustments/Labs and Tests Ordered: Current medicines are reviewed at length with the patient today.  Concerns regarding medicines are outlined above.  No orders of the defined types were placed in this encounter.  No orders of the defined types were placed in this encounter.   Patient Instructions  Medication Instructions:  Your physician recommends that you continue on your current medications as directed. Please refer to the Current Medication list given to you today.  *If you need a refill on your cardiac medications before your next appointment, please call your pharmacy*  Lab Work: None If you have labs (blood work) drawn today and your tests are completely normal, you will receive your results only by: [name] MyChart Message (if you have MyChart) OR . A paper copy in the mail If you have any lab test that is abnormal or we need to change your treatment, we will call you to review the results.  Testing/Procedures: None  Follow-Up: At [facility], you and your health needs are our priority.  As part of our continuing mission to provide you with exceptional heart care, we have created designated Provider Care Teams.   These Care Teams include your primary Cardiologist (physician) and Advanced Practice Providers (APPs -  Physician Assistants and Nurse Practitioners) who all work together  to provide you with the care you need, when you need it.  Your next appointment:   9-12 month(s)  The format for your next appointment:   In Person  Provider:   You may see Lesleigh Noe, MD or one of the following Advanced Practice Providers on your designated Care Team:    Norma Fredrickson, NP  Nada Boozer, NP  Georgie Chard, NP   Other Instructions      Signed, Lesleigh Noe, MD  09/13/2019 3:11 PM    [facility]

## 2019-09-13 ENCOUNTER — Ambulatory Visit (INDEPENDENT_AMBULATORY_CARE_PROVIDER_SITE_OTHER): Payer: No Typology Code available for payment source | Admitting: Interventional Cardiology

## 2019-09-13 ENCOUNTER — Encounter: Payer: Self-pay | Admitting: Interventional Cardiology

## 2019-09-13 ENCOUNTER — Other Ambulatory Visit: Payer: Self-pay

## 2019-09-13 VITALS — BP 104/52 | HR 51 | Ht 62.5 in | Wt 159.8 lb

## 2019-09-13 DIAGNOSIS — I480 Paroxysmal atrial fibrillation: Secondary | ICD-10-CM | POA: Diagnosis not present

## 2019-09-13 DIAGNOSIS — Z7901 Long term (current) use of anticoagulants: Secondary | ICD-10-CM

## 2019-09-13 DIAGNOSIS — I25709 Atherosclerosis of coronary artery bypass graft(s), unspecified, with unspecified angina pectoris: Secondary | ICD-10-CM | POA: Diagnosis not present

## 2019-09-13 DIAGNOSIS — I10 Essential (primary) hypertension: Secondary | ICD-10-CM | POA: Diagnosis not present

## 2019-09-13 DIAGNOSIS — Z7189 Other specified counseling: Secondary | ICD-10-CM

## 2019-09-13 DIAGNOSIS — I519 Heart disease, unspecified: Secondary | ICD-10-CM | POA: Diagnosis not present

## 2019-09-13 NOTE — Patient Instructions (Signed)
Medication Instructions:  Your physician recommends that you continue on your current medications as directed. Please refer to the Current Medication list given to you today.  *If you need a refill on your cardiac medications before your next appointment, please call your pharmacy*  Lab Work: None If you have labs (blood work) drawn today and your tests are completely normal, you will receive your results only by: . MyChart Message (if you have MyChart) OR . A paper copy in the mail If you have any lab test that is abnormal or we need to change your treatment, we will call you to review the results.  Testing/Procedures: None  Follow-Up: At [facility], you and your health needs are our priority.  As part of our continuing mission to provide you with exceptional heart care, we have created designated Provider Care Teams.  These Care Teams include your primary Cardiologist (physician) and Advanced Practice Providers (APPs -  Physician Assistants and Nurse Practitioners) who all work together to provide you with the care you need, when you need it.  Your next appointment:   9-12 month(s)  The format for your next appointment:   In Person  Provider:   You may see Henry W Smith III, MD or one of the following Advanced Practice Providers on your designated Care Team:    Lori Gerhardt, NP  Laura Ingold, NP  Jill McDaniel, NP   Other Instructions   

## 2019-09-21 ENCOUNTER — Other Ambulatory Visit: Payer: Self-pay

## 2019-09-21 MED ORDER — METOPROLOL SUCCINATE ER 100 MG PO TB24: ORAL_TABLET | ORAL | 3 refills | Status: DC

## 2019-10-08 ENCOUNTER — Other Ambulatory Visit: Payer: Self-pay | Admitting: *Deleted

## 2019-10-08 MED ORDER — METOPROLOL SUCCINATE ER 100 MG PO TB24: ORAL_TABLET | ORAL | 2 refills | Status: DC

## 2019-12-16 ENCOUNTER — Encounter (HOSPITAL_COMMUNITY): Payer: Self-pay | Admitting: Anesthesiology

## 2019-12-16 ENCOUNTER — Encounter (HOSPITAL_COMMUNITY)
Admission: EM | Disposition: A | Discharge status: Home / Self Care | Payer: Self-pay | Source: Home / Self Care | Attending: Emergency Medicine

## 2019-12-16 ENCOUNTER — Emergency Department (HOSPITAL_COMMUNITY): Payer: No Typology Code available for payment source

## 2019-12-16 ENCOUNTER — Other Ambulatory Visit: Payer: Self-pay | Admitting: Physician Assistant

## 2019-12-16 ENCOUNTER — Encounter (HOSPITAL_COMMUNITY): Payer: Self-pay | Admitting: Emergency Medicine

## 2019-12-16 ENCOUNTER — Ambulatory Visit (HOSPITAL_COMMUNITY)
Admission: EM | Admit: 2019-12-16 | Discharge: 2019-12-17 | Disposition: A | Discharge status: Home / Self Care | Payer: No Typology Code available for payment source | Attending: Gastroenterology | Admitting: Gastroenterology

## 2019-12-16 ENCOUNTER — Other Ambulatory Visit: Payer: Self-pay

## 2019-12-16 DIAGNOSIS — E669 Obesity, unspecified: Secondary | ICD-10-CM | POA: Diagnosis not present

## 2019-12-16 DIAGNOSIS — Y939 Activity, unspecified: Secondary | ICD-10-CM | POA: Diagnosis not present

## 2019-12-16 DIAGNOSIS — K449 Diaphragmatic hernia without obstruction or gangrene: Secondary | ICD-10-CM | POA: Insufficient documentation

## 2019-12-16 DIAGNOSIS — I25709 Atherosclerosis of coronary artery bypass graft(s), unspecified, with unspecified angina pectoris: Secondary | ICD-10-CM | POA: Insufficient documentation

## 2019-12-16 DIAGNOSIS — Z20822 Contact with and (suspected) exposure to covid-19: Secondary | ICD-10-CM | POA: Diagnosis not present

## 2019-12-16 DIAGNOSIS — Z6829 Body mass index (BMI) 29.0-29.9, adult: Secondary | ICD-10-CM | POA: Diagnosis not present

## 2019-12-16 DIAGNOSIS — R1310 Dysphagia, unspecified: Secondary | ICD-10-CM

## 2019-12-16 DIAGNOSIS — K222 Esophageal obstruction: Secondary | ICD-10-CM

## 2019-12-16 DIAGNOSIS — K2091 Esophagitis, unspecified with bleeding: Secondary | ICD-10-CM

## 2019-12-16 DIAGNOSIS — X58XXXA Exposure to other specified factors, initial encounter: Secondary | ICD-10-CM | POA: Insufficient documentation

## 2019-12-16 DIAGNOSIS — Z90710 Acquired absence of both cervix and uterus: Secondary | ICD-10-CM | POA: Insufficient documentation

## 2019-12-16 DIAGNOSIS — I4892 Unspecified atrial flutter: Secondary | ICD-10-CM | POA: Diagnosis not present

## 2019-12-16 DIAGNOSIS — Z823 Family history of stroke: Secondary | ICD-10-CM | POA: Diagnosis not present

## 2019-12-16 DIAGNOSIS — Z951 Presence of aortocoronary bypass graft: Secondary | ICD-10-CM | POA: Insufficient documentation

## 2019-12-16 DIAGNOSIS — I4891 Unspecified atrial fibrillation: Secondary | ICD-10-CM | POA: Insufficient documentation

## 2019-12-16 DIAGNOSIS — I5042 Chronic combined systolic (congestive) and diastolic (congestive) heart failure: Secondary | ICD-10-CM | POA: Diagnosis not present

## 2019-12-16 DIAGNOSIS — K2970 Gastritis, unspecified, without bleeding: Secondary | ICD-10-CM | POA: Insufficient documentation

## 2019-12-16 DIAGNOSIS — Z888 Allergy status to other drugs, medicaments and biological substances status: Secondary | ICD-10-CM | POA: Insufficient documentation

## 2019-12-16 DIAGNOSIS — I110 Hypertensive heart disease with heart failure: Secondary | ICD-10-CM | POA: Insufficient documentation

## 2019-12-16 DIAGNOSIS — Z87891 Personal history of nicotine dependence: Secondary | ICD-10-CM | POA: Insufficient documentation

## 2019-12-16 DIAGNOSIS — I255 Ischemic cardiomyopathy: Secondary | ICD-10-CM | POA: Insufficient documentation

## 2019-12-16 DIAGNOSIS — Z79899 Other long term (current) drug therapy: Secondary | ICD-10-CM | POA: Insufficient documentation

## 2019-12-16 DIAGNOSIS — I739 Peripheral vascular disease, unspecified: Secondary | ICD-10-CM | POA: Diagnosis not present

## 2019-12-16 DIAGNOSIS — Z7982 Long term (current) use of aspirin: Secondary | ICD-10-CM | POA: Insufficient documentation

## 2019-12-16 DIAGNOSIS — I252 Old myocardial infarction: Secondary | ICD-10-CM | POA: Diagnosis not present

## 2019-12-16 DIAGNOSIS — E7800 Pure hypercholesterolemia, unspecified: Secondary | ICD-10-CM | POA: Insufficient documentation

## 2019-12-16 DIAGNOSIS — Z8249 Family history of ischemic heart disease and other diseases of the circulatory system: Secondary | ICD-10-CM | POA: Diagnosis not present

## 2019-12-16 DIAGNOSIS — E785 Hyperlipidemia, unspecified: Secondary | ICD-10-CM | POA: Diagnosis not present

## 2019-12-16 DIAGNOSIS — Z955 Presence of coronary angioplasty implant and graft: Secondary | ICD-10-CM | POA: Insufficient documentation

## 2019-12-16 DIAGNOSIS — Z7901 Long term (current) use of anticoagulants: Secondary | ICD-10-CM | POA: Insufficient documentation

## 2019-12-16 DIAGNOSIS — T18128A Food in esophagus causing other injury, initial encounter: Secondary | ICD-10-CM

## 2019-12-16 HISTORY — PX: FOREIGN BODY REMOVAL: SHX962

## 2019-12-16 HISTORY — PX: BIOPSY: SHX5522

## 2019-12-16 HISTORY — PX: ESOPHAGOGASTRODUODENOSCOPY: SHX5428

## 2019-12-16 LAB — I-STAT CHEM 8, ED
BUN: 17 mg/dL (ref 8–23)
Calcium, Ion: 1.2 mmol/L (ref 1.15–1.40)
Chloride: 109 mmol/L (ref 98–111)
Creatinine, Ser: 1 mg/dL (ref 0.44–1.00)
Glucose, Bld: 86 mg/dL (ref 70–99)
HCT: 42 % (ref 36.0–46.0)
Hemoglobin: 14.3 g/dL (ref 12.0–15.0)
Potassium: 4.3 mmol/L (ref 3.5–5.1)
Sodium: 145 mmol/L (ref 135–145)
TCO2: 27 mmol/L (ref 22–32)

## 2019-12-16 LAB — SARS CORONAVIRUS 2 BY RT PCR (HOSPITAL ORDER, PERFORMED IN ~~LOC~~ HOSPITAL LAB): SARS Coronavirus 2: NEGATIVE

## 2019-12-16 SURGERY — EGD (ESOPHAGOGASTRODUODENOSCOPY)
Anesthesia: Moderate Sedation

## 2019-12-16 MED ORDER — MIDAZOLAM HCL (PF) 10 MG/2ML IJ SOLN
INTRAMUSCULAR | Status: DC | PRN
  Administered 2019-12-16 (×2): 2 mg via INTRAVENOUS
  Administered 2019-12-16: 1 mg via INTRAVENOUS

## 2019-12-16 MED ORDER — MIDAZOLAM HCL (PF) 5 MG/ML IJ SOLN
INTRAMUSCULAR | Status: AC
  Filled 2019-12-16: qty 2

## 2019-12-16 MED ORDER — OMEPRAZOLE 40 MG PO CPDR
40.0000 mg | DELAYED_RELEASE_CAPSULE | Freq: Two times a day (BID) | ORAL | 1 refills | Status: DC
Start: 2019-12-16 — End: 2020-11-11

## 2019-12-16 MED ORDER — FENTANYL CITRATE (PF) 100 MCG/2ML IJ SOLN
INTRAMUSCULAR | Status: AC
  Filled 2019-12-16: qty 4

## 2019-12-16 MED ORDER — BUTAMBEN-TETRACAINE-BENZOCAINE 2-2-14 % EX AERO
INHALATION_SPRAY | CUTANEOUS | Status: DC | PRN
  Administered 2019-12-16: 1 via TOPICAL

## 2019-12-16 MED ORDER — OMEPRAZOLE 40 MG PO CPDR
40.0000 mg | DELAYED_RELEASE_CAPSULE | Freq: Every day | ORAL | 1 refills | Status: DC
Start: 2019-12-16 — End: 2019-12-16

## 2019-12-16 MED ORDER — GLUCAGON HCL RDNA (DIAGNOSTIC) 1 MG IJ SOLR
1.0000 mg | Freq: Once | INTRAMUSCULAR | Status: AC
  Administered 2019-12-16: 1 mg via INTRAMUSCULAR

## 2019-12-16 MED ORDER — GLUCAGON HCL RDNA (DIAGNOSTIC) 1 MG IJ SOLR
1.0000 mg | Freq: Once | INTRAMUSCULAR | Status: DC
  Filled 2019-12-16: qty 1

## 2019-12-16 MED ORDER — FENTANYL CITRATE (PF) 100 MCG/2ML IJ SOLN
INTRAMUSCULAR | Status: DC | PRN
  Administered 2019-12-16 (×3): 25 ug via INTRAVENOUS

## 2019-12-16 MED ORDER — SODIUM CHLORIDE 0.9 % IV SOLN
INTRAVENOUS | Status: DC
  Administered 2019-12-16: 500 mL via INTRAVENOUS

## 2019-12-16 MED ORDER — DIPHENHYDRAMINE HCL 50 MG/ML IJ SOLN
INTRAMUSCULAR | Status: AC
  Filled 2019-12-16: qty 1

## 2019-12-16 NOTE — ED Provider Notes (Signed)
[facility] EMERGENCY DEPARTMENT Provider Note   CSN: [id number] Arrival date & time: 12/16/19  1622     History Chief Complaint  Patient presents with  . Foreign Body  . Sore Throat    [name] is a 59 y.o. female.  HPI 59 year old female with a history of CHF, hypertension, NSTEMI, hypercholesteremia, a flutter presents to the ER for inability to swallow liquids and foods.  Patient states that she ate some PACU at approximately noon yesterday, and has not been able to swallow her water or food since. She states that she is missing some teeth and thinks she didn't chew the piece of BBQ correctly.   She states that this is happened before but normally her symptoms past within 24 hours.  She refers that whenever she drinks water, she has to throw it/cough it back up.  She has been able to tolerate her own saliva.  Denies any shortness of breath, chest pain.  She does not have any allergies, denies throat swelling, hives, itching, nausea, no fevers, chills, drooling.  She has not seen a GI doctor for this.  Notes mild throat soreness secondary to having to cough her fluids along solids back up.    Past Medical History:  Diagnosis Date  . Atrial fibrillation ([place])   . Atrial flutter ([place])   . CHF (congestive heart failure) ([place])   . Coronary atherosclerosis of native coronary artery   . HTN (hypertension)   . Hx of CABG    LIMA-LAD, SVG-DIAG, SVG-OM, SVG-RCA  . Hypercholesteremia   . Myocardial infarct ([place]) 2007  . NSTEMI (non-ST elevated myocardial infarction) ([place]) 10/13/2015  . Obesity     Patient Active Problem List   Diagnosis Date Noted  . Vasovagal syncope 06/29/2019  . Peripheral vascular disease, unspecified ([place]) 03/06/2019  . Acute on chronic congestive heart failure ([place])   . Respiratory failure with hypercapnia ([place]) 12/16/2017  . Atrial flutter with rapid ventricular response ([place]) 12/16/2017  . Respiratory distress 12/16/2017  . Elevated  troponin   . ACS (acute coronary syndrome) ([place]) 10/15/2015  . NSTEMI (non-ST elevated myocardial infarction) ([place])   . Hypercholesteremia   . New onset atrial fibrillation ([place])   . Chronic combined systolic and diastolic HF (heart failure), NYHA class 2 ([place])   . Obesity   . Coronary artery disease involving coronary bypass graft of native heart with angina pectoris ([place])   . HTN (hypertension)     Past Surgical History:  Procedure Laterality Date  . ABDOMINAL HYSTERECTOMY    . CARDIAC CATHETERIZATION N/A 10/14/2015   Procedure: Left Heart Cath and Cors/Grafts Angiography;  Surgeon: Leonie Man, MD;  Location: [facility];  Service: Cardiovascular;  Laterality: N/A;  . CARDIAC CATHETERIZATION N/A 10/14/2015   Procedure: Coronary Stent Intervention;  Surgeon: Leonie Man, MD;  Location: [facility];  Service: Cardiovascular;  Laterality: N/A;  . CESAREAN SECTION  1985  . CORONARY ANGIOPLASTY WITH STENT PLACEMENT  2003   LAD Promus DES Archie Endo 10/13/2015  . CORONARY ARTERY BYPASS GRAFT  03/2006   "CABG X3"  . LEFT HEART CATH AND CORS/GRAFTS ANGIOGRAPHY N/A 12/09/2017   Procedure: LEFT HEART CATH AND CORS/GRAFTS ANGIOGRAPHY;  Surgeon: Leonie Man, MD;  Location: [facility];  Service: Cardiovascular;  Laterality: N/A;  . MITRAL VALVE REPAIR     Archie Endo 10/13/2015  . TEE WITHOUT CARDIOVERSION N/A 12/07/2017   Procedure: TRANSESOPHAGEAL ECHOCARDIOGRAM (TEE);  Surgeon: Pixie Casino,  MD;  Location: [facility];  Service: Cardiovascular;  Laterality: N/A;     OB History   No obstetric history on file.     Family History  Problem Relation Age of Onset  . Hypertension Father   . Stroke Sister   . Hypertension Sister   . Hypertension Brother   . Heart attack Neg Hx     Social History   Tobacco Use  . Smoking status: Former Smoker    Packs/day: 1.00    Years: 30.00    Pack years: 30.00    Types: Cigarettes  . Smokeless tobacco: Never Used  .  Tobacco comment: "quit smoking cigarettes in ~ 2007"  Substance Use Topics  . Alcohol use: Yes    Alcohol/week: 1.0 standard drinks    Types: 1 Cans of beer per week    Comment: occ  . Drug use: No    Home Medications Prior to Admission medications   Medication Sig Start Date End Date Taking? Authorizing Provider  acetaminophen (TYLENOL) 500 MG tablet Take 1,000 mg by mouth as needed for mild pain.    [provider]  aspirin EC 81 MG tablet Take 1 tablet (81 mg total) by mouth daily. 01/25/17   Lyn Records, MD  atorvastatin (LIPITOR) 80 MG tablet TAKE 1 TABLET BY MOUTH EVERY DAY 04/02/19   [name], NP  digoxin (LANOXIN) 0.25 MG tablet TAKE 1 TABLET BY MOUTH DAILY. PLEASE MAKE OVERDUE APPT WITH DR. Katrinka Blazing BEFORE ANYMORE RF 1ST ATTEMPT 05/04/19   Lyn Records, MD  ELIQUIS 5 MG TABS tablet TAKE 1 TABLET BY MOUTH TWICE DAILY 08/23/19   Lyn Records, MD  furosemide (LASIX) 40 MG tablet Take 1 tablet (40 mg total) by mouth daily. 06/18/19   Lyn Records, MD  metoprolol succinate (TOPROL-XL) 100 MG 24 hr tablet TAKE 1 TABLET (100 MG TOTAL) BY MOUTH 2 (TWO) TIMES DAILY. 10/08/19   Lyn Records, MD  nitroGLYCERIN (NITROSTAT) 0.4 MG SL tablet PLEASE SEE ATTACHED FOR DETAILED DIRECTIONS 03/31/18   Lyn Records, MD  omeprazole (PRILOSEC) 40 MG capsule Take 1 capsule (40 mg total) by mouth 2 (two) times daily. 12/16/19   Mare Ferrari, PA-C  sacubitril-valsartan (ENTRESTO) 49-51 MG Take 1 tablet by mouth 2 (two) times daily. 12/22/18   Kroeger, Ovidio Kin., PA-C  triamcinolone cream (KENALOG) 0.1 % Apply 1 application topically at bedtime.    [provider]    Allergies    Other and Adhesive [tape]  Review of Systems   Review of Systems  Constitutional: Negative for chills, fatigue and fever.  HENT: Positive for sore throat and trouble swallowing. Negative for dental problem, drooling and ear pain.   Eyes: Negative for pain and visual disturbance.  Respiratory:  Positive for cough. Negative for choking and shortness of breath.   Cardiovascular: Negative for chest pain and palpitations.  Gastrointestinal: Positive for vomiting. Negative for abdominal pain and nausea.  Genitourinary: Negative for dysuria and hematuria.  Musculoskeletal: Negative for arthralgias and back pain.  Skin: Negative for color change and rash.  Neurological: Negative for seizures and syncope.  All other systems reviewed and are negative.   Physical Exam Updated Vital Signs BP (!) 113/57   Pulse (!) 58   Temp 98.1 F (36.7 C) (Oral)   Resp 16   Ht 5\' 2"  (1.575 m)   Wt 73.5 kg   SpO2 100%   BMI 29.63 kg/m   Physical Exam Vitals and  nursing note reviewed.  Constitutional:      General: She is not in acute distress.    Appearance: Normal appearance. She is well-developed. She is not ill-appearing, toxic-appearing or diaphoretic.  HENT:     Head: Normocephalic and atraumatic.     Nose: Nose normal.     Mouth/Throat:     Mouth: Mucous membranes are dry.     Dentition: Normal dentition.     Tongue: No lesions. Tongue does not deviate from midline.     Pharynx: Oropharynx is clear. Uvula midline. No pharyngeal swelling or posterior oropharyngeal erythema.     Tonsils: No tonsillar exudate or tonsillar abscesses.     Comments: Normal tongue size, uvula midline, raises symmetrically.  No evidence of tonsillar swelling or exudate.  No visible food impaction.  No submandibular/sublingual swelling.  Patient phonating well.  Tolerating secretions well.  No tenderness to submandibular area/throat.  No abdominal tenderness Eyes:     Conjunctiva/sclera: Conjunctivae normal.  Cardiovascular:     Rate and Rhythm: Normal rate and regular rhythm.     Heart sounds: No murmur.  Pulmonary:     Effort: Pulmonary effort is normal. No respiratory distress.     Breath sounds: Normal breath sounds.  Abdominal:     General: Abdomen is flat.     Palpations: Abdomen is soft.      Tenderness: There is no abdominal tenderness.  Musculoskeletal:        General: Normal range of motion.     Cervical back: Neck supple.  Skin:    General: Skin is warm and dry.  Neurological:     General: No focal deficit present.     Mental Status: She is alert and oriented to person, place, and time.  Psychiatric:        Mood and Affect: Mood normal.        Behavior: Behavior normal.     ED Results / Procedures / Treatments   Labs (all labs ordered are listed, but only abnormal results are displayed) Labs Reviewed  SARS CORONAVIRUS 2 BY RT PCR (HOSPITAL ORDER, PERFORMED IN [facility])  I-STAT CHEM 8, ED    EKG None  Radiology DG Neck Soft Tissue  Result Date: 12/16/2019 CLINICAL DATA:  Feeling of food stuck in 3. EXAM: NECK SOFT TISSUES - 1+ VIEW COMPARISON:  None. FINDINGS: No radiodense foreign body within the oropharynx or hypopharynx. Trachea normal. Epiglottis normal. Normal prevertebral soft tissues. IMPRESSION: Normal soft tissue exam of the neck. Electronically Signed   By: Genevive Bi M.D.   On: 12/16/2019 17:02   DG Chest 1 View  Result Date: 12/16/2019 CLINICAL DATA:  trouble swallowing EXAM: CHEST  1 VIEW COMPARISON:  Chest radiograph 04/28/2019 FINDINGS: Stable cardiomediastinal contours status post median sternotomy and CABG. The lungs are clear. No pneumothorax or significant pleural effusion. No acute finding in the visualized skeleton. IMPRESSION: No acute cardiopulmonary process. Electronically Signed   By: Emmaline Kluver M.D.   On: 12/16/2019 19:44    Procedures Procedures (including critical care time)  Medications Ordered in ED Medications  glucagon (human recombinant) (GLUCAGEN) injection 1 mg ( Intravenous MAR Unhold 12/16/19 2329)  glucagon (human recombinant) (GLUCAGEN) injection 1 mg (1 mg Intramuscular Given 12/16/19 2053)    ED Course  I have reviewed the triage vital signs and the nursing notes.  Pertinent labs &  imaging results that were available during my care of the patient were reviewed by me and considered in my  medical decision making (see chart for details).  Clinical Course as of Dec 15 2337  Wynelle Link Dec 16, 2019  1905 Dr. Jonnie Kind   [MB]  2043 Omeprazole protonix twice daily 40mg  several minutes    [MB]    Clinical Course User Index [MB]   MDM Rules/Calculators/A&P                      59 year old female with inability to swallow foods and liquids since yesterday. On presentation, the patient is alert and oriented, nontoxic-appearing, in no acute distress.  She is tolerating her own secretions and speaking with no difficulty.  Physical exam without acute abnormalities, uvula is midline, rises symmetrically, no evidence of food in oral cavity.  No concern for Ludwick's, peritonsillar abscess.  I p.o. challenged the patient in the ER, she proceeded to vomit the water back up within several minutes.  She is overall well-appearing though.  Concern for esophageal food impaction, pending GI consult.  7:08 PM: Spoke with Dr. 77 with [facility], he would like to try glucagon and report back to 145 minutes to assess the patient's status.  He also request chest x-ray.  Patient is on Eliquis, last EF in July 07 2034 to 40%.  Covid negative.  8:43 PM: Patient received IM glucagon, and after 45 minutes, was not able to hold down any fluids.  Reconsulted Dr. 08-12-1968, he will gather his team for an ER endoscopy.  Patient is NPO until then.  10:07 PM: GI at bedside  11:05 PM: Updated by Dr. Meridee Score, EGD successfully maneuvered food bolus into the stomach.  Grade D esophagitis was noted distally and was biopsied.  She also was noted to have some gastritis which was also biopsied.  He recommends 40 mg PPI twice daily for now.  Gradual advancement to normal diet over the next 48 hours.  He is updated the patient and the son who will come pick the patient up.  He will follow  up with the patient in 6 weeks.  On my reevaluation, the patient appears stable for discharge.  Prescribed several months worth of omeprazole twice daily.  Return precautions has been given and all questions answered.  Patient voices understanding is agreeable to the plan.  At this stage in ED course, the patient is medically screened and stable for discharge.  I discussed this patient with Dr. Meridee Score and he is agreeable to the above plan.  Final Clinical Impression(s) / ED Diagnoses Final diagnoses:  Esophageal obstruction due to food impaction    Rx / DC Orders ED Discharge Orders         Ordered    omeprazole (PRILOSEC) 40 MG capsule  Daily,   Status:  Discontinued     12/16/19 2300    omeprazole (PRILOSEC) 40 MG capsule  2 times daily     12/16/19 2309           2310 12/16/19 2339    2340, MD 12/17/19 [phone]

## 2019-12-16 NOTE — Consult Note (Signed)
Gastroenterology Inpatient Consultation   Attending Requesting Consult Dorie Rank, [facility] Day: 1  Reason for Consult Food Impaction concern   History of Present Illness  [name] is a 59 y.o. female with a pmh significant for ischemic cardiomyopathy with CHFrEF, status post CABG, atrial fibrillation/atrial flutter (on anticoagulation), hypertension, hyperlipidemia, obesity.  The GI service is consulted for evaluation and management of concern for acute food impaction.  The patient was eating dinner yesterday, barbecue and since then she has had a sensation of something being stuck in the midportion of her chest.  She has tried to eat and drink but everything has been coming up over the course of last night into this morning and afternoon.  Because things persisted she came in for further evaluation.  She was given a p.o. challenge and failed this.  Glucagon was administered after discussion with me and that has also failed in alleviating the patient's symptoms.  Patient has atrial fibrillation/atrial flutter and is on Eliquis.  She last tried to take her medication earlier today but is not clear that it actually stayed down.  The patient has had a similar type of situation 6 months ago which resolved on its own with time and with fluid being able to be administered and slowly this was allowing her to improve her symptoms.  She has never seen a gastroenterologist previously.  She denies overt gastroesophageal reflux disease or pyrosis on a frequent basis.  Patient does not take significant nonsteroidals or BC/Goody powders.  She has never had an upper or lower endoscopy.  Patient has had Cologuard screening as her colorectal cancer screening within the last year and a half.  GI Review of Systems Positive as above Negative for odynophagia, coffee-ground emesis, hematemesis, change in bowel habits, melena, hematochezia   Review of Systems  General: Denies fevers/chills/weight  loss HEENT: Positive for sore throat over the course of today as a result of the retching and vomiting; denies oral lesions Cardiovascular: Denies chest pain/palpitations Pulmonary: Denies shortness of breath Gastroenterological: See HPI Genitourinary: Denies darkened urine Hematological: Positive for easy bruising/bleeding due to anticoagulation Dermatological: Denies jaundice Psychological: Mood is anxious   Histories  Past Medical History Past Medical History:  Diagnosis Date  . Atrial fibrillation ([place])   . Atrial flutter ([place])   . CHF (congestive heart failure) ([place])   . Coronary atherosclerosis of native coronary artery   . HTN (hypertension)   . Hx of CABG    LIMA-LAD, SVG-DIAG, SVG-OM, SVG-RCA  . Hypercholesteremia   . Myocardial infarct ([place]) 2007  . NSTEMI (non-ST elevated myocardial infarction) ([place]) 10/13/2015  . Obesity    Past Surgical History:  Procedure Laterality Date  . ABDOMINAL HYSTERECTOMY    . CARDIAC CATHETERIZATION N/A 10/14/2015   Procedure: Left Heart Cath and Cors/Grafts Angiography;  Surgeon: Leonie Man, MD;  Location: [facility];  Service: Cardiovascular;  Laterality: N/A;  . CARDIAC CATHETERIZATION N/A 10/14/2015   Procedure: Coronary Stent Intervention;  Surgeon: Leonie Man, MD;  Location: [facility];  Service: Cardiovascular;  Laterality: N/A;  . CESAREAN SECTION  1985  . CORONARY ANGIOPLASTY WITH STENT PLACEMENT  2003   LAD Promus DES Archie Endo 10/13/2015  . CORONARY ARTERY BYPASS GRAFT  03/2006   "CABG X3"  . LEFT HEART CATH AND CORS/GRAFTS ANGIOGRAPHY N/A 12/09/2017   Procedure: LEFT HEART CATH AND CORS/GRAFTS ANGIOGRAPHY;  Surgeon: Leonie Man, MD;  Location: [facility];  Service:  Cardiovascular;  Laterality: N/A;  . MITRAL VALVE REPAIR     [name] 10/13/2015  . TEE WITHOUT CARDIOVERSION N/A 12/07/2017   Procedure: TRANSESOPHAGEAL ECHOCARDIOGRAM (TEE);  Surgeon: Chrystie Nose, MD;  Location[facility];   Service: Cardiovascular;  Laterality: N/A;    Allergies Allergies  Allergen Reactions  . Other Itching    GEL ON ELECTRODES FOR EKG  . Adhesive [Tape]     Family History Family History  Problem Relation Age of Onset  . Hypertension Father   . Stroke Sister   . Hypertension Sister   . Hypertension Brother   . Heart attack Neg Hx    The patient's FH is negative for IBD/IBS/Liver Disease/GI Malignancies.  Social History Social History   Socioeconomic History  . Marital status: Married    Spouse name: Not on file  . Number of children: Not on file  . Years of education: Not on file  . Highest education level: Not on file  Occupational History  . Not on file  Tobacco Use  . Smoking status: Former Smoker    Packs/day: 1.00    Years: 30.00    Pack years: 30.00    Types: Cigarettes  . Smokeless tobacco: Never Used  . Tobacco comment: "quit smoking cigarettes in ~ 2007"  Substance and Sexual Activity  . Alcohol use: Yes    Alcohol/week: 1.0 standard drinks    Types: 1 Cans of beer per week    Comment: occ  . Drug use: No  . Sexual activity: Not Currently  Other Topics Concern  . Not on file  Social History Narrative  . Not on file   Social Determinants of Health   Financial Resource Strain:   . Difficulty of Paying Living Expenses:   Food Insecurity:   . Worried About Programme researcher, broadcasting/film/video in the Last Year:   . Barista in the Last Year:   Transportation Needs:   . Freight forwarder (Medical):   [name] Lack of Transportation (Non-Medical):   Physical Activity:   . Days of Exercise per Week:   . Minutes of Exercise per Session:   Stress:   . Feeling of Stress :   Social Connections:   . Frequency of Communication with Friends and Family:   . Frequency of Social Gatherings with Friends and Family:   . Attends Religious Services:   . Active Member of Clubs or Organizations:   . Attends Banker Meetings:   [name] Marital Status:   Intimate  Partner Violence:   . Fear of Current or Ex-Partner:   . Emotionally Abused:   [name] Physically Abused:   . Sexually Abused:    The patient denies Drug, Alcohol, or Tobacco use.   Occupation is Travel   Medications  Home Medications No current facility-administered medications on file prior to encounter.   Current Outpatient Medications on File Prior to Encounter  Medication Sig Dispense Refill  . acetaminophen (TYLENOL) 500 MG tablet Take 1,000 mg by mouth as needed for mild pain.    [name] aspirin EC 81 MG tablet Take 1 tablet (81 mg total) by mouth daily. 90 tablet 3  . atorvastatin (LIPITOR) 80 MG tablet TAKE 1 TABLET BY MOUTH EVERY DAY 30 tablet 11  . digoxin (LANOXIN) 0.25 MG tablet TAKE 1 TABLET BY MOUTH DAILY. PLEASE MAKE OVERDUE APPT WITH DR. Katrinka Blazing BEFORE ANYMORE RF 1ST ATTEMPT 30 tablet 2  . ELIQUIS 5 MG TABS tablet TAKE 1 TABLET BY MOUTH  TWICE DAILY 60 tablet 10  . furosemide (LASIX) 40 MG tablet Take 1 tablet (40 mg total) by mouth daily. 90 tablet 3  . metoprolol succinate (TOPROL-XL) 100 MG 24 hr tablet TAKE 1 TABLET (100 MG TOTAL) BY MOUTH 2 (TWO) TIMES DAILY. 180 tablet 2  . nitroGLYCERIN (NITROSTAT) 0.4 MG SL tablet PLEASE SEE ATTACHED FOR DETAILED DIRECTIONS 25 tablet 9  . sacubitril-valsartan (ENTRESTO) 49-51 MG Take 1 tablet by mouth 2 (two) times daily. 180 tablet 3  . triamcinolone cream (KENALOG) 0.1 % Apply 1 application topically at bedtime.     Scheduled Inpatient Medications . glucagon (human recombinant)  1 mg Intravenous Once   Continuous Inpatient Infusions  PRN Inpatient Medications    Physical Examination  BP 139/71   Pulse (!) 54   Temp 98.1 F (36.7 C) (Oral)   Resp 16   Ht 5\' 2"  (1.575 m)   Wt 73.5 kg   SpO2 100%   BMI 29.63 kg/m  GEN: NAD, appears stated age, doesn't appear chronically ill PSYCH: Cooperative, without pressured speech EYE: Conjunctivae pink, sclerae anicteric ENT: MMM, without oral ulcers NECK: Supple CV:  Nontachycardic RESP: No wheezing present GI: NABS, soft, NT/ND, without rebound or guarding MSK/EXT: No significant lower extremity edema SKIN: No jaundice NEURO:  Alert & Oriented x 3, no focal deficits   Review of Data  I reviewed the following data at the time of this encounter:  Laboratory Studies   Recent Labs  Lab 12/16/19 1648  NA 145  K 4.3  CL 109  BUN 17  CREATININE 1.00  GLUCOSE 86   No results for input(s): AST, ALT, GGT, ALKPHOS in the last 168 hours.  Invalid input(s): TBILI, CONJBILI, ALB  Recent Labs  Lab 12/16/19 1648  HGB 14.3  HCT 42.0   No results for input(s): APTT, INR in the last 168 hours.  Imaging Studies  Neck x-ray IMPRESSION: Normal soft tissue exam of the neck.  Chest x-ray IMPRESSION: No acute cardiopulmonary process.  GI Procedures and Studies  No relevant studies to review   Assessment  [name] is a 59 y.o. female with a pmh significant for ischemic cardiomyopathy with CHFrEF, status post CABG, atrial fibrillation/atrial flutter (on anticoagulation), hypertension, hyperlipidemia, obesity.  The GI service is consulted for evaluation and management of concern for acute food impaction.  The patient is hemodynamically stable.  Clinically however, she is unable to tolerate oral intake and I have concern about an acute food impaction.  Chest x-ray and neck x-ray are unremarkable.  History significant for 1 prior episode of potential obstruction that self resolved.  I suspect she has a Schatzki ring and hiatal hernia although other etiologies such as rings or webs or malignancy need to be considered.  Diagnostic endoscopy with therapeutic intent of trying to remove the food bolus or push the food bolus into the stomach is necessary.  She has a history of heart failure with reduced ejection fraction but overall seems to be competent and not having any evidence currently of what would be considered cardiac issues.  As such we will proceed  with moderate sedation this evening in an attempt of trying to optimize and clear this as quickly as possible.  If issues arise will ask our anesthesia team for assistance tomorrow morning.  The risks and benefits of endoscopic evaluation were discussed with the patient; these include but are not limited to the risk of perforation, infection, bleeding, missed lesions, lack of diagnosis, severe illness requiring  hospitalization, as well as anesthesia and sedation related illnesses.  The patient is agreeable to proceed.  Increased risk in setting of recent anticoagulation use was described and discussed with the patient.  The patient understands that we would not recommend her driving home after today's procedure.  She is by herself but she can have her son potentially come to pick her up.  That will be the emergency department decision moving forward after we hopefully are successful at removing the acute food bolus impaction.   Plan/Recommendations  Please maintain NPO Status Plan for tentative EGD this evening Hold Heparin/VTE PPx Hold anticoagulation   Thank you for this consult.  We will continue to follow.  Please page/call with questions or concerns.   Corliss Parish, MD [facility] Office # [phone]

## 2019-12-16 NOTE — Op Note (Signed)
[facility] Patient Name: [name] Procedure Date : 12/16/2019 MRN: [id number] Attending MD: Justice Britain , MD Date of Birth: January 04, 1961 CSN: [id number] Age: 59 Admit Type: Outpatient Procedure:                Upper GI endoscopy Indications:              Dysphagia, Foreign body in the esophagus Providers:                Justice Britain, MD, Cleda Daub, RN,                            William Dalton, Technician Referring MD:             Laverle Hobby, MD Medicines:                Fentanyl 75 micrograms IV, Midazolam 5 mg IV Complications:            No immediate complications. Estimated Blood Loss:     Estimated blood loss was minimal. Procedure:                Pre-Anesthesia Assessment:                           - Prior to the procedure, a History and Physical                            was performed, and patient medications and                            allergies were reviewed. The patient's tolerance of                            previous anesthesia was also reviewed. The risks                            and benefits of the procedure and the sedation                            options and risks were discussed with the patient.                            All questions were answered, and informed consent                            was obtained. Prior Anticoagulants: The patient has                            taken Eliquis (apixaban), last dose was day of                            procedure. ASA Grade Assessment: II - A patient                            with mild systemic disease. After reviewing the  risks and benefits, the patient was deemed in                            satisfactory condition to undergo the procedure.                           After obtaining informed consent, the endoscope was                            passed under direct vision. Throughout the                            procedure, the patient's  blood pressure, pulse, and                            oxygen saturations were monitored continuously. The                            GIF-H190 ([id number]) Olympus gastroscope was                            introduced through the mouth, and advanced to the                            second part of duodenum. The upper GI endoscopy was                            accomplished without difficulty. The patient                            tolerated the procedure. Scope In: Scope Out: Findings:      No gross lesions were noted in the proximal esophagus.      Food bolus was found in the middle/distal esophagus and in the distal       esophagus. Removal of food was accomplished by gentle traction and       maneuvering of the bolus into the stomach. Suction via Endoscope of       small foodstuffs was also performed.      LA Grade D (one or more mucosal breaks involving at least 75% of       esophageal circumference) esophagitis with bleeding was found in the       distal esophagus after food bolus removal (30-39 cm) This was biopsied       with a cold forceps for histology.      A 3 cm hiatal hernia was present.      Segmental mild inflammation characterized by erythema and granularity       was found in the gastric body, at the incisura and in the gastric       antrum. Biopsies were taken with a cold forceps for histology and       Helicobacter pylori testing.      No gross lesions were noted in the duodenal bulb, in the first portion       of the duodenum and in the second portion of the duodenum. Impression:               -  No gross lesions in esophagus proximally.                           - Food in the mid/distal esophagus and in the                            distal esophagus. Removal was successful.                           - LA Grade D esophagitis with bleeding. Biopsied.                           - 3 cm hiatal hernia.                           - Gastritis. Biopsied for HP.                            - No gross lesions in the duodenal bulb, in the                            first portion of the duodenum and in the second                            portion of the duodenum. Recommendation:           - The patient will be observed post-procedure,                            until all discharge criteria are met.                           - Return patient to ED care for ongoing care.                           - Clear liquids for next 24 hours then Soft diet                            for 2 days.                           - Await pathology results.                           - Start Omeprazole or Protonix 40 mg twice daily                            for now and give enough refills for next 2-3 months.                           - Follow up in GI clinic in 6 weeks will be                            arranged.                           -  Plan repeat EGD in 10-12 weeks for follow up                            esophagitis healing and potential dilation.                           - The findings and recommendations were discussed                            with the patient.                           - The findings and recommendations were attempted                            to be discussed with the patient's family but not                            successful.                           - The findings and recommendations were discussed                            with the referring physician. Procedure Code(s):        --- Professional ---                           [phone], Esophagogastroduodenoscopy, flexible,                            transoral; with removal of foreign body(s)                           43239, Esophagogastroduodenoscopy, flexible,                            transoral; with biopsy, single or multiple Diagnosis Code(s):        --- Professional ---                           Q76.226J, Food in esophagus causing other injury,                            initial encounter                            K20.91, Esophagitis, unspecified with bleeding                           K44.9, Diaphragmatic hernia without obstruction or                            gangrene                           K29.70, Gastritis, unspecified, without bleeding  R13.10, Dysphagia, unspecified                           T18.108A, Unspecified foreign body in esophagus                            causing other injury, initial encounter CPT copyright 2019 American Medical Association. All rights reserved. The codes documented in this report are preliminary and upon coder review may  be revised to meet current compliance requirements. Justice Britain, MD 12/16/2019 10:47:29 PM Number of Addenda: 0

## 2019-12-16 NOTE — Progress Notes (Signed)
See Provation Note under Procedures Tab in Epic for full details of EGD  Food bolus noted. Maneuvered into the stomach and suction via endoscope for removal. Grade D esophagitis distally.  Biopsied. Gastritis noted.  Biopsied. Normal duodenum.  PPI 40 mg BID for now. CLD for 24 hours then Soft diet for 48 hours before advancement to normal diet. Patient updated. Son updated. ED updated.  Corliss Parish, MD [facility] Office # [phone]

## 2019-12-16 NOTE — Anesthesia Preprocedure Evaluation (Deleted)
Anesthesia Evaluation    Airway       Dental   Pulmonary former smoker,          Cardiovascular hypertension,     Neuro/Psych    GI/Hepatic   Endo/Other    Renal/GU      Musculoskeletal   Abdominal   Peds  Hematology   Anesthesia Other Findings   Reproductive/Obstetrics                          Anesthesia Physical Anesthesia Plan Anesthesia Quick Evaluation  

## 2019-12-16 NOTE — Discharge Instructions (Addendum)
Please take the prescribed medication twice a day for at least several months.  Please follow-up with Dr. Meridee Score in 6 weeks as instructed.  Return to the ER if your symptoms worsen.

## 2019-12-16 NOTE — ED Triage Notes (Signed)
Onset one day ago ate BBQ at 1200 and since then have not able to swallow water or food. Airway intact. Throat sore from coughing currently 4/10.

## 2019-12-18 ENCOUNTER — Telehealth: Payer: Self-pay

## 2019-12-18 LAB — SURGICAL PATHOLOGY

## 2019-12-18 NOTE — Telephone Encounter (Signed)
appt scheduled to see Dr Meridee Score on 02/14/20 at 330 pm.  Pt notified The pt has been advised of the information and verbalized understanding.

## 2019-12-18 NOTE — Telephone Encounter (Signed)
-----   Message from Lemar Lofty., MD sent at 12/16/2019 11:04 PM EDT ----- Regarding: Follow up Nello Corro, Set up a follow up in clinic in 6-weeks with me or one of the APPs. EGD in 10-12 weeks in [facility] with dilation. Thanks. GM

## 2020-01-10 ENCOUNTER — Other Ambulatory Visit: Payer: Self-pay | Admitting: Medical

## 2020-01-11 NOTE — Telephone Encounter (Signed)
She should be off diltiazem due to systolic HF.

## 2020-01-14 MED ORDER — ATORVASTATIN CALCIUM 80 MG PO TABS: 80.0000 mg | ORAL_TABLET | Freq: Every day | ORAL | 7 refills | Status: DC

## 2020-01-18 ENCOUNTER — Other Ambulatory Visit: Payer: Self-pay | Admitting: Medical

## 2020-01-18 MED ORDER — SACUBITRIL-VALSARTAN 49-51 MG PO TABS: 1.0000 | ORAL_TABLET | Freq: Two times a day (BID) | ORAL | 2 refills | Status: DC

## 2020-01-24 ENCOUNTER — Other Ambulatory Visit: Payer: Self-pay | Admitting: Medical

## 2020-02-11 IMAGING — DX DG CHEST 2V
2 series · 2 of 2 positions shown · non-contrast
Comparison: December 16, 2017

CLINICAL DATA: Weakness.  Near syncope.

EXAM:
CHEST - 2 VIEW

[chest lat]
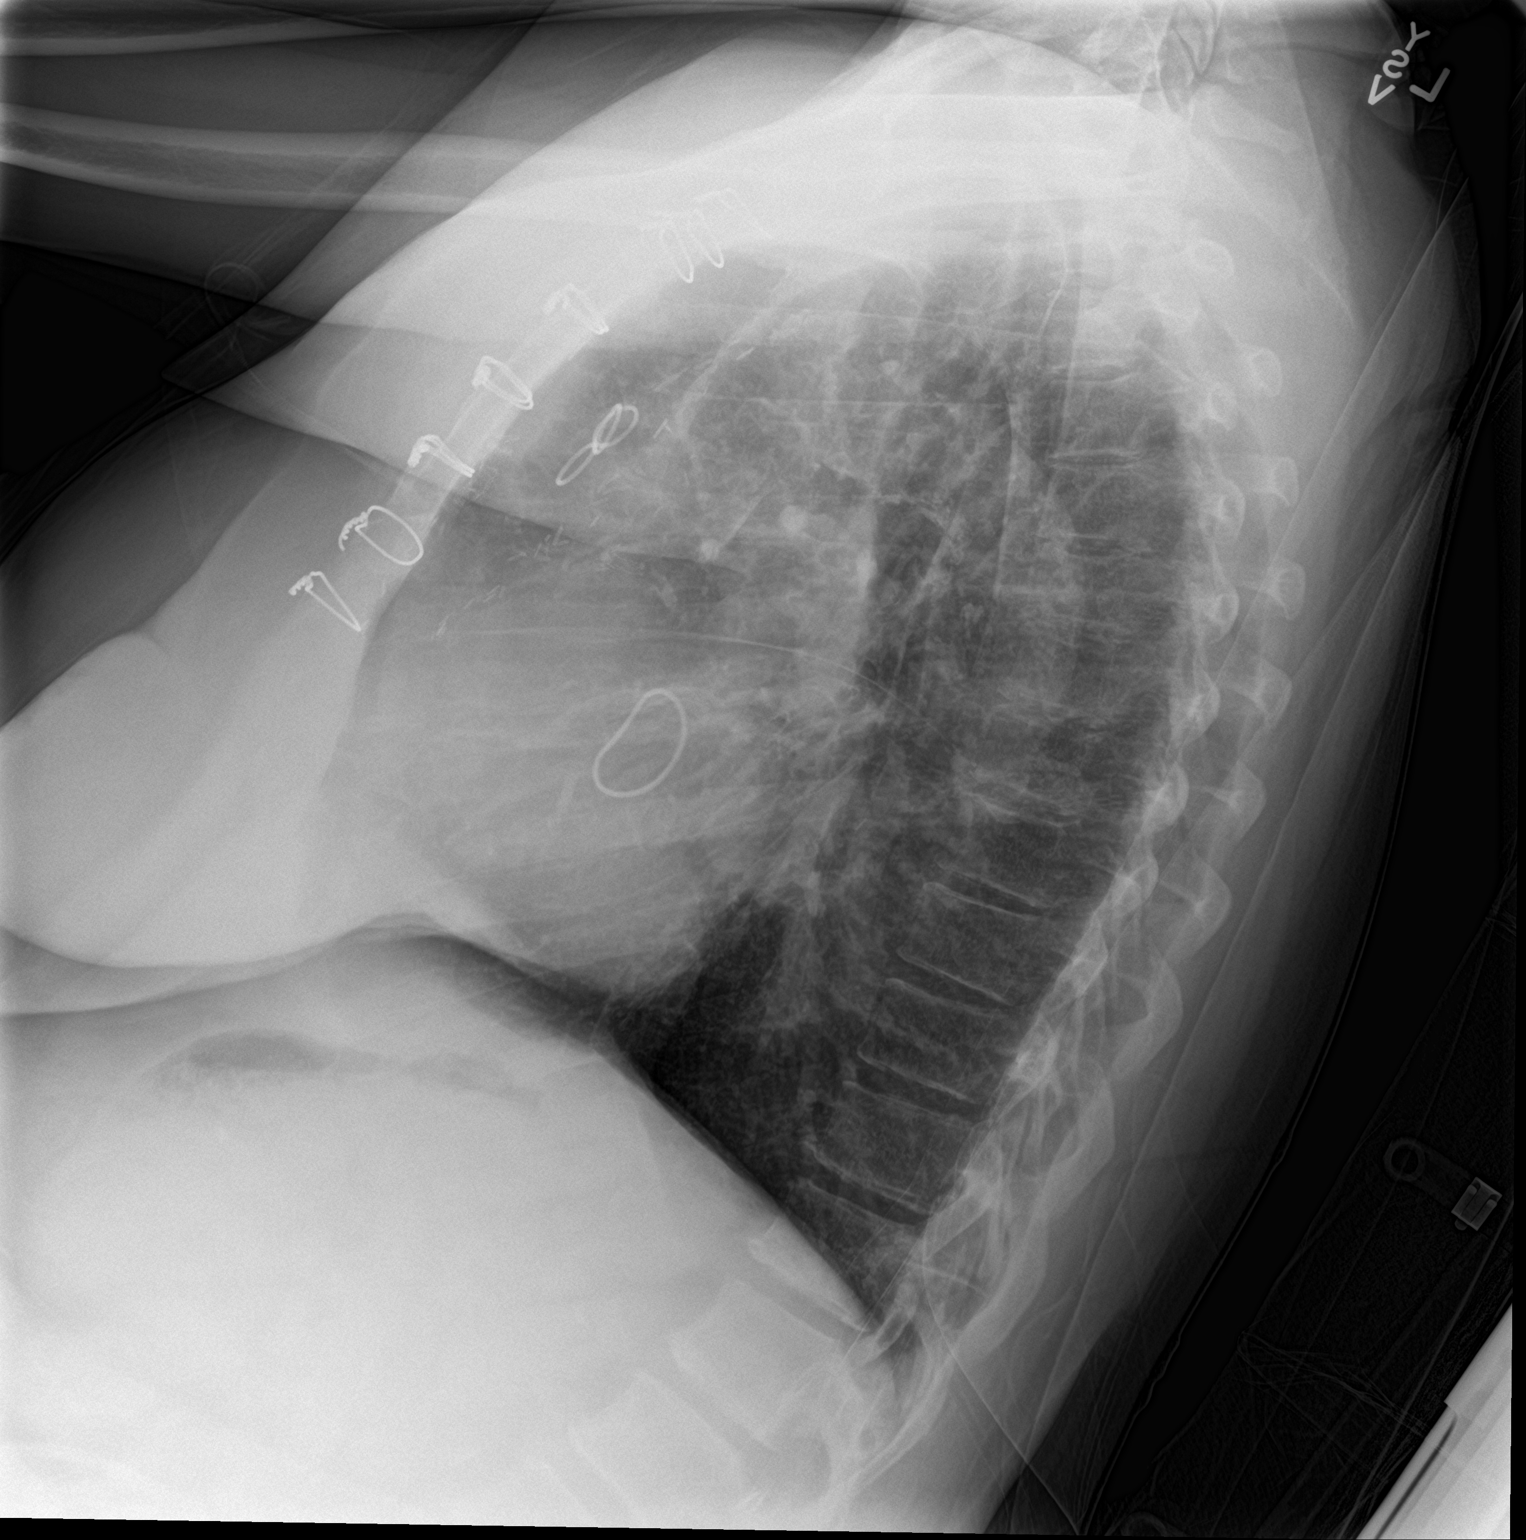

[chest ap]
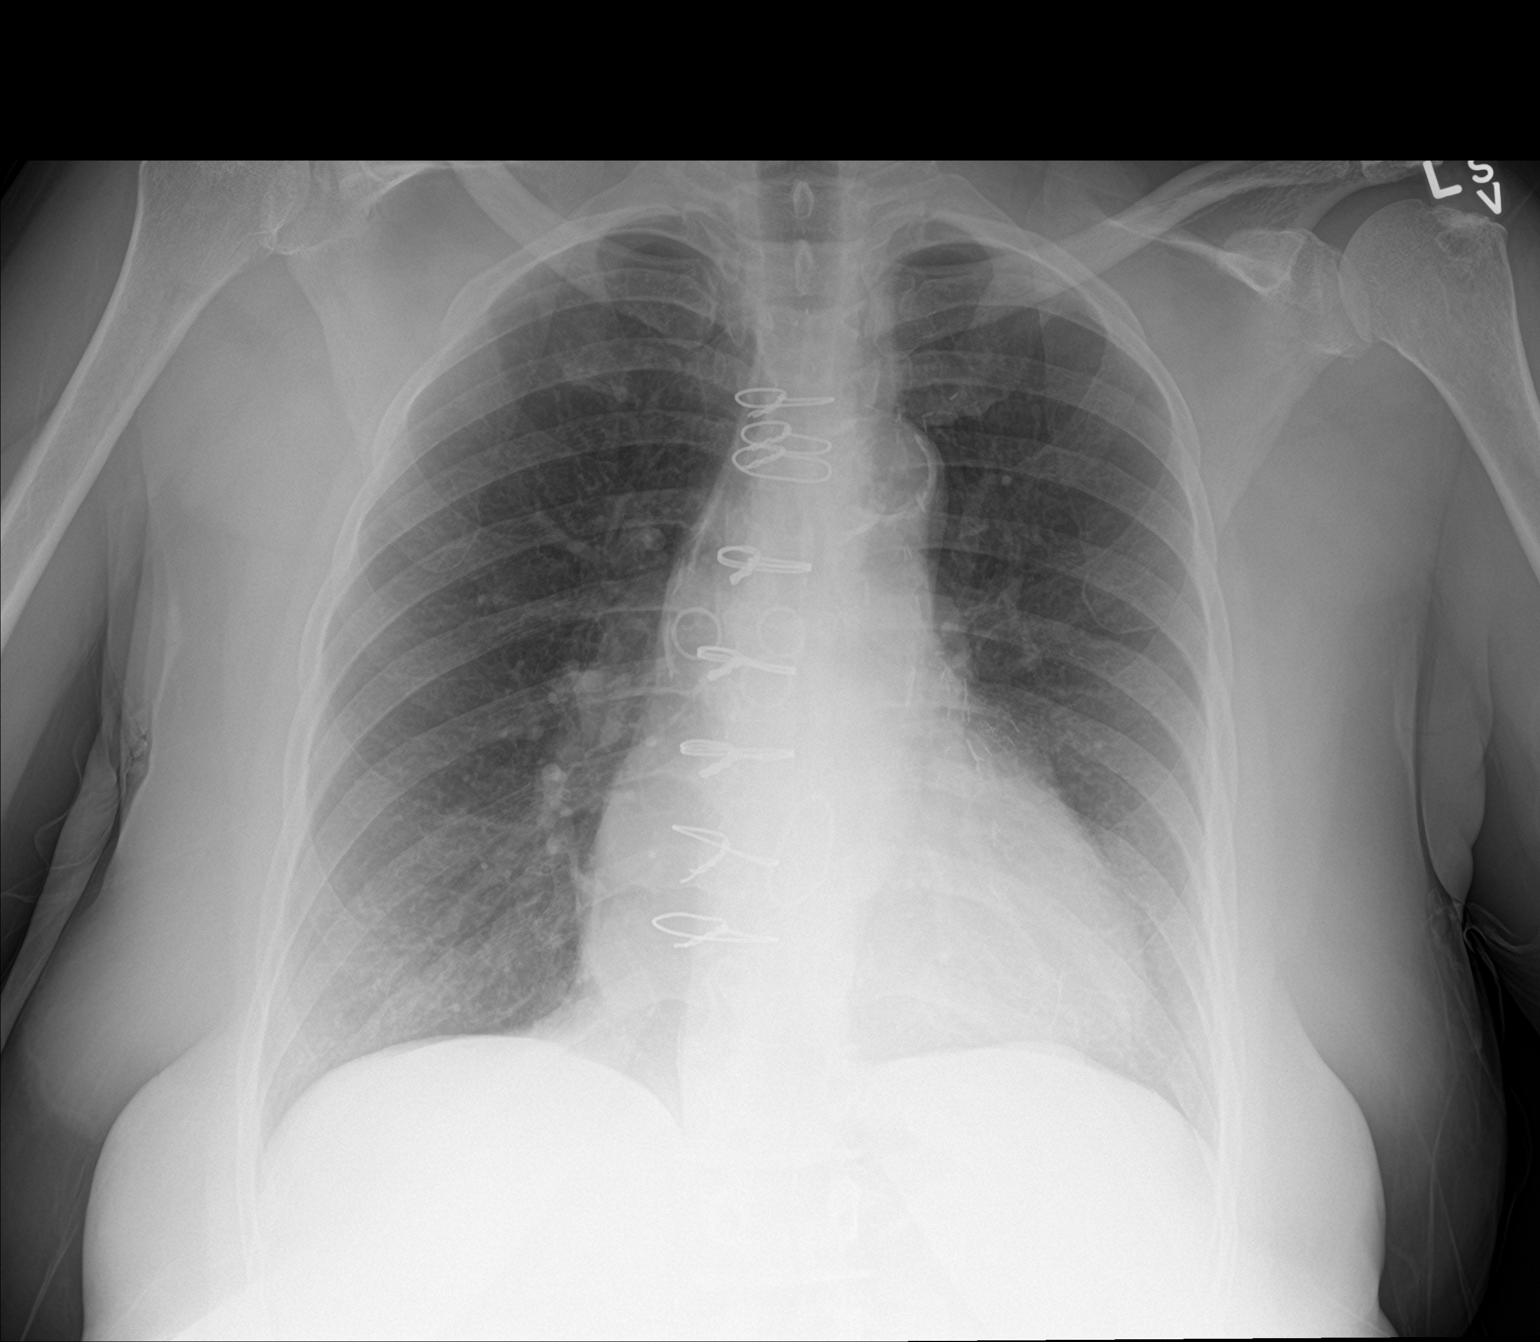

[2 of 2 positions shown; findings below may reference images not displayed]

FINDINGS: The heart size remains enlarged. The patient is status post prior
median sternotomy. Aortic calcifications are noted. There is no
acute osseous abnormality.
IMPRESSION: No active cardiopulmonary disease.

## 2020-02-14 ENCOUNTER — Ambulatory Visit: Payer: No Typology Code available for payment source | Admitting: Gastroenterology

## 2020-04-07 ENCOUNTER — Other Ambulatory Visit: Payer: Self-pay | Admitting: *Deleted

## 2020-04-07 MED ORDER — SACUBITRIL-VALSARTAN 49-51 MG PO TABS: 1.0000 | ORAL_TABLET | Freq: Two times a day (BID) | ORAL | 1 refills | Status: DC

## 2020-04-09 ENCOUNTER — Other Ambulatory Visit: Payer: Self-pay | Admitting: *Deleted

## 2020-04-09 MED ORDER — SACUBITRIL-VALSARTAN 49-51 MG PO TABS: 1.0000 | ORAL_TABLET | Freq: Two times a day (BID) | ORAL | 1 refills | Status: DC

## 2020-06-24 ENCOUNTER — Other Ambulatory Visit: Payer: Self-pay | Admitting: Interventional Cardiology

## 2020-06-24 NOTE — Telephone Encounter (Signed)
Pt last saw Dr Katrinka Blazing 09/13/19, last labs 12/16/19 Creat 1.00, age 59, weight 73.5kg, based on specified criteria pt is on appropriate dosage of Eliquis 5mg  BID.  Will refill rx.

## 2020-07-02 ENCOUNTER — Other Ambulatory Visit: Payer: Self-pay

## 2020-07-02 MED ORDER — FUROSEMIDE 40 MG PO TABS: 40.0000 mg | ORAL_TABLET | Freq: Every day | ORAL | 0 refills | Status: DC

## 2020-07-08 ENCOUNTER — Other Ambulatory Visit: Payer: Self-pay | Admitting: *Deleted

## 2020-07-08 MED ORDER — METOPROLOL SUCCINATE ER 100 MG PO TB24: ORAL_TABLET | ORAL | 4 refills | Status: DC

## 2020-09-03 ENCOUNTER — Other Ambulatory Visit: Payer: Self-pay | Admitting: Interventional Cardiology

## 2020-09-12 ENCOUNTER — Other Ambulatory Visit: Payer: Self-pay | Admitting: Interventional Cardiology

## 2020-09-26 ENCOUNTER — Other Ambulatory Visit: Payer: Self-pay | Admitting: Interventional Cardiology

## 2020-10-01 ENCOUNTER — Other Ambulatory Visit: Payer: Self-pay

## 2020-10-24 ENCOUNTER — Telehealth: Payer: Self-pay | Admitting: Interventional Cardiology

## 2020-10-24 MED ORDER — SACUBITRIL-VALSARTAN 49-51 MG PO TABS: 1.0000 | ORAL_TABLET | Freq: Two times a day (BID) | ORAL | 0 refills | Status: DC

## 2020-10-24 NOTE — Telephone Encounter (Signed)
*  STAT* If patient is at the pharmacy, call can be transferred to refill team.   1. Which medications need to be refilled? (please list name of each medication and dose if known) sacubitril-valsartan (ENTRESTO) 49-51 MG  2. Which pharmacy/location (including street and city if local pharmacy) is medication to be sent to? [id number] - [place], [place] - [address] AT [facility] [address]  3. Do they need a 30 day or 90 day supply? 90

## 2020-10-25 ENCOUNTER — Other Ambulatory Visit: Payer: Self-pay | Admitting: Physician Assistant

## 2020-10-25 ENCOUNTER — Other Ambulatory Visit: Payer: Self-pay | Admitting: Interventional Cardiology

## 2020-11-03 NOTE — Progress Notes (Signed)
Cardiology Office Note    Date:  11/11/2020   ID:  [name], DOB 07/30/1960, MRN [id number]   PCP:  Georgianne Fick, MD   [facility]  Cardiologist:  Lesleigh Noe, MD  Advanced Practice Provider:  No care team member to display Electrophysiologist:  None   [id number]}   Chief Complaint  Patient presents with  . Follow-up    History of Present Illness:  [name] is a 60 y.o. female  with a hx of CAD, prior LAD Promus DES 2003 and ultimately CABG with MV repair in 2007 for CHF related to multivessel CAD and MR. SVG to RCA DES 09/2015. Most recent LVEF 35-40%).   PAF on Eliquis,  PAD involving left lower extremity. Syncope 06/2020 when her husband had a stroke.  Patient last saw Dr. Katrinka Blazing 09/13/2019 at which time she was doing well.   Patient comes in for f/u. Denies chest pain, dyspnea, palpitations, edema. Walks 1/2 mile 3 days a week. 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 Also cares for her husband. She applied for disability and was rejected a month ago but appealed it. Has pain in legs with walking so can't walk far. She saw Dr. Allyson Sabal in 2020 and was scheduled for surgery but never had it because her husband had a stroke.   Past Medical History:  Diagnosis Date  . Atrial fibrillation (HCC)   . Atrial flutter (HCC)   . CHF (congestive heart failure) (HCC)   . Coronary atherosclerosis of native coronary artery   . HTN (hypertension)   . Hx of CABG    LIMA-LAD, SVG-DIAG, SVG-OM, SVG-RCA  . Hypercholesteremia   . Myocardial infarct (HCC) 2007  . NSTEMI (non-ST elevated myocardial infarction) (HCC) 10/13/2015  . Obesity     Past Surgical History:  Procedure Laterality Date  . ABDOMINAL HYSTERECTOMY    . BIOPSY  12/16/2019   Procedure: BIOPSY;  Surgeon: Meridee Score Netty Starring., MD;  Location: [facility] ENDOSCOPY;  Service: Gastroenterology;;  . CARDIAC CATHETERIZATION N/A 10/14/2015   Procedure: Left Heart Cath and Cors/Grafts  Angiography;  Surgeon: Marykay Lex, MD;  Location: [facility] INVASIVE CV LAB;  Service: Cardiovascular;  Laterality: N/A;  . CARDIAC CATHETERIZATION N/A 10/14/2015   Procedure: Coronary Stent Intervention;  Surgeon: Marykay Lex, MD;  Location: [facility] INVASIVE CV LAB;  Service: Cardiovascular;  Laterality: N/A;  . CESAREAN SECTION  1985  . CORONARY ANGIOPLASTY WITH STENT PLACEMENT  2003   LAD Promus DES [name] 10/13/2015  . CORONARY ARTERY BYPASS GRAFT  03/2006   "CABG X3"  . ESOPHAGOGASTRODUODENOSCOPY N/A 12/16/2019   Procedure: ESOPHAGOGASTRODUODENOSCOPY (EGD);  Surgeon: Lemar Lofty., MD;  Location: [facility] ENDOSCOPY;  Service: Gastroenterology;  Laterality: N/A;  . FOREIGN BODY REMOVAL  12/16/2019   Procedure: FOREIGN BODY REMOVAL;  Surgeon: Meridee Score Netty Starring., MD;  Location: [facility] ENDOSCOPY;  Service: Gastroenterology;;  . LEFT HEART CATH AND CORS/GRAFTS ANGIOGRAPHY N/A 12/09/2017   Procedure: LEFT HEART CATH AND CORS/GRAFTS ANGIOGRAPHY;  Surgeon: Marykay Lex, MD;  Location: [facility] INVASIVE CV LAB;  Service: Cardiovascular;  Laterality: N/A;  . MITRAL VALVE REPAIR     [name] 10/13/2015  . TEE WITHOUT CARDIOVERSION N/A 12/07/2017   Procedure: TRANSESOPHAGEAL ECHOCARDIOGRAM (TEE);  Surgeon: Chrystie Nose, MD;  Location: [facility] ENDOSCOPY;  Service: Cardiovascular;  Laterality: N/A;    Current Medications: Current Meds  Medication Sig  . acetaminophen (TYLENOL) 500 MG tablet Take 1,000 mg by mouth as needed for mild  pain.  . aspirin EC 81 MG tablet Take 1 tablet (81 mg total) by mouth daily.  [name]. atorvastatin (LIPITOR) 80 MG tablet Take 1 tablet (80 mg total) by mouth daily. Please make overdue appt with Dr. Katrinka BlazingSmith before anymore refills. Thank you 1st attempt  . ELIQUIS 5 MG TABS tablet TAKE 1 TABLET BY MOUTH TWICE DAILY  . furosemide (LASIX) 40 MG tablet Take 1 tablet (40 mg total) by mouth daily.  . metoprolol succinate (TOPROL-XL) 100 MG 24 hr tablet TAKE 1 TABLET(100 MG) BY MOUTH TWICE  DAILY. Please make overdue appt with Dr. Katrinka BlazingSmith before anymore refills. Thank you 1st attempt  . nitroGLYCERIN (NITROSTAT) 0.4 MG SL tablet PLEASE SEE ATTACHED FOR DETAILED DIRECTIONS  . sacubitril-valsartan (ENTRESTO) 49-51 MG Take 1 tablet by mouth 2 (two) times daily.  [name]. triamcinolone cream (KENALOG) 0.1 % Apply 1 application topically at bedtime.     Allergies:   Other and Adhesive [tape]   Social History   Socioeconomic History  . Marital status: Married    Spouse name: Not on file  . Number of children: Not on file  . Years of education: Not on file  . Highest education level: Not on file  Occupational History  . Not on file  Tobacco Use  . Smoking status: Former Smoker    Packs/day: 1.00    Years: 30.00    Pack years: 30.00    Types: Cigarettes  . Smokeless tobacco: Never Used  . Tobacco comment: "quit smoking cigarettes in ~ 2007"  Vaping Use  . Vaping Use: Never used  Substance and Sexual Activity  . Alcohol use: Yes    Alcohol/week: 1.0 standard drink    Types: 1 Cans of beer per week    Comment: occ  . Drug use: No  . Sexual activity: Not Currently  Other Topics Concern  . Not on file  Social History Narrative  . Not on file   Social Determinants of Health   Financial Resource Strain: Not on file  Food Insecurity: Not on file  Transportation Needs: Not on file  Physical Activity: Not on file  Stress: Not on file  Social Connections: Not on file     Family History:  The patient's family history includes Hypertension in her brother, father, and sister; Stroke in her sister.   ROS:   Please see the history of present illness.    ROS All other systems reviewed and are negative.   PHYSICAL EXAM:   VS:  BP 118/68   Pulse 74   Ht 5\' 2"  (1.575 m)   Wt 164 lb 9.6 oz (74.7 kg)   SpO2 96%   BMI 30.11 kg/m   Physical Exam  GEN: Well nourished, well developed, in no acute distress  Neck: no JVD, carotid bruits, or masses Cardiac:RRR; no murmurs, rubs,  or gallops  Respiratory:  clear to auscultation bilaterally, normal work of breathing GI: soft, nontender, nondistended, + BS Ext: without cyanosis, clubbing, or edema, Can't palpate DP or PT  pulses bilaterally Neuro:  Alert and Oriented x 3 Psych: euthymic mood, full affect  Wt Readings from Last 3 Encounters:  11/11/20 164 lb 9.6 oz (74.7 kg)  12/16/19 162 lb (73.5 kg)  09/13/19 159 lb 12.8 oz (72.5 kg)      Studies/Labs Reviewed:   EKG:  EKG is  ordered today.  The ekg ordered today demonstrates NSR with first degree AV block, inferior infarct, ant lat TWI,   Recent Labs: 12/16/2019: BUN  17; Creatinine, Ser 1.00; Hemoglobin 14.3; Potassium 4.3; Sodium 145   Lipid Panel    Component Value Date/Time   CHOL 123 12/04/2017 1536   CHOL 191 08/24/2017 1536   TRIG 89 12/04/2017 1536   HDL 32 (L) 12/04/2017 1536   HDL 58 08/24/2017 1536   CHOLHDL 3.8 12/04/2017 1536   VLDL 18 12/04/2017 1536   LDLCALC 73 12/04/2017 1536   LDLCALC 96 08/24/2017 1536    Additional studies/ records that were reviewed today include:   Holter monitor 08/16/2019  The underlying rhythm is normal sinus.  Nonsustained ventricular tachycardia, longest run 25 seconds at 180 bpm. Multiple nonsustained runs less than 5 beats. No report of complaints although disturbance occurred while presumably asleep.  No atrial fibrillation or flutter.   Echo 06/29/2019 IMPRESSIONS     1. Left ventricular ejection fraction, by visual estimation, is 35 to  40%. The left ventricle has normal function. There is moderately increased  left ventricular hypertrophy.   2. The left ventricle has no regional wall motion abnormalities.   3. Global right ventricle has mildly reduced systolic function.The right  ventricular size is normal. No increase in right ventricular wall  thickness.   4. Left atrial size was mildly dilated.   5. Right atrial size was normal.   6. Moderate mitral annular calcification.   7. The  mitral valve is grossly normal. Mild mitral valve regurgitation.   8. The tricuspid valve is grossly normal. Tricuspid valve regurgitation  is not demonstrated.   9. Aortic valve regurgitation is mild to moderate.  10. The aortic valve is normal in structure. Aortic valve regurgitation is  mild to moderate.  11. The pulmonic valve was grossly normal. Pulmonic valve regurgitation is  not visualized.  12. The atrial septum is grossly normal.   LE arterial dopplers 04/24/18  Final Interpretation:  Right: 50-74% stenosis noted in the CFA.  Multiple 30-49% stenosis noted in the proximal SFA, distal to the ostium  (high end range).  75-99% stenosis noted in the mid SFA.  Three vessel run-off.    Risk Assessment/Calculations:    CHA2DS2-VASc Score = 4  This indicates a 4.8% annual risk of stroke. The patient's score is based upon: CHF History: Yes HTN History: Yes Diabetes History: No Stroke History: No Vascular Disease History: Yes Age Score: 0 Gender Score: 1        ASSESSMENT:    1. Chronic systolic CHF (congestive heart failure) (HCC)   2. Ischemic cardiomyopathy   3. Coronary artery disease involving coronary bypass graft of native heart with angina pectoris (HCC)   4. Paroxysmal atrial fibrillation (HCC)   5. Essential hypertension   6. Claudication (HCC)   7. Peripheral vascular disease, unspecified (HCC)      PLAN:  In order of problems listed above:  Chronic systolic CHF compensated. No longer on Dig, will leave off.  Ischemic cardiomyopathy ejection fraction 35 to 40%.  Will f/u echo.  CAD status post DES to the LAD in 2003, CABG with MV repair 2007, DES to the SVG to the RCA 2017  PAF on Eliquis-rate controlled on Toprol and in NSR. No bleeding problems. Check labs.  Hypertension controlled  PAD-claudication in both legs now-was supposed to have surgery 2 yrs ago but husband had a stroke. Will repeat arterial dopplers and refer back to Dr.  Allyson Sabal.   Shared Decision Making/Informed Consent        Medication Adjustments/Labs and Tests Ordered: Current medicines are reviewed at  length with the patient today.  Concerns regarding medicines are outlined above.  Medication changes, Labs and Tests ordered today are listed in the Patient Instructions below. Patient Instructions  Medication Instructions:  Your physician recommends that you continue on your current medications as directed. Please refer to the Current Medication list given to you today.   *If you need a refill on your cardiac medications before your next appointment, please call your pharmacy*   Lab Work: Cmp, Cbc, Tsh - Today   If you have labs (blood work) drawn today and your tests are completely normal, you will receive your results only by: [name] MyChart Message (if you have MyChart) OR . A paper copy in the mail If you have any lab test that is abnormal or we need to change your treatment, we will call you to review the results.   Testing/Procedures: Your physician has requested that you have an echocardiogram. Echocardiography is a painless test that uses sound waves to create images of your heart. It provides your doctor with information about the size and shape of your heart and how well your heart's chambers and valves are working. This procedure takes approximately one hour. There are no restrictions for this procedure.  Your physician has requested that you have a lower extremity arterial exercise duplex. During this test, exercise and ultrasound are used to evaluate arterial blood flow in the legs. Allow one hour for this exam. There are no restrictions or special instructions.  Follow-Up: At [facility], you and your health needs are our priority.  As part of our continuing mission to provide you with exceptional heart care, we have created designated Provider Care Teams.  These Care Teams include your primary Cardiologist (physician) and Advanced  Practice Providers (APPs -  Physician Assistants and Nurse Practitioners) who all work together to provide you with the care you need, when you need it.  We recommend signing up for the patient portal called "MyChart".  Sign up information is provided on this After Visit Summary.  MyChart is used to connect with patients for Virtual Visits (Telemedicine).  Patients are able to view lab/test results, encounter notes, upcoming appointments, etc.  Non-urgent messages can be sent to your provider as well.   To learn more about what you can do with MyChart, go to [email or web address].    Your next appointment:   12 month(s)  The format for your next appointment:   In Person  Provider:   You may see Lesleigh Noe, MD or one of the following Advanced Practice Providers on your designated Care Team:    Georgie Chard, NP   Follow up with Dr. Allyson Sabal at his next available    Other Instructions None      Signed, Jacolyn Reedy, PA-C  11/11/2020 8:18 AM    [facility] [address] [place], [place], [place]  [address] Phone: [phone]; Fax: [phone]

## 2020-11-09 ENCOUNTER — Other Ambulatory Visit: Payer: Self-pay

## 2020-11-10 MED ORDER — METOPROLOL SUCCINATE ER 100 MG PO TB24: ORAL_TABLET | ORAL | 0 refills | Status: DC

## 2020-11-10 MED ORDER — FUROSEMIDE 40 MG PO TABS: 40.0000 mg | ORAL_TABLET | Freq: Every day | ORAL | 0 refills | Status: DC

## 2020-11-10 MED ORDER — ENTRESTO 49-51 MG PO TABS: 1.0000 | ORAL_TABLET | Freq: Two times a day (BID) | ORAL | 0 refills | Status: DC

## 2020-11-10 MED ORDER — ATORVASTATIN CALCIUM 80 MG PO TABS: 80.0000 mg | ORAL_TABLET | Freq: Every day | ORAL | 0 refills | Status: DC

## 2020-11-11 ENCOUNTER — Other Ambulatory Visit: Payer: Self-pay

## 2020-11-11 ENCOUNTER — Encounter: Payer: Self-pay | Admitting: Physician Assistant

## 2020-11-11 ENCOUNTER — Ambulatory Visit (INDEPENDENT_AMBULATORY_CARE_PROVIDER_SITE_OTHER): Payer: No Typology Code available for payment source | Admitting: Physician Assistant

## 2020-11-11 VITALS — BP 118/68 | HR 74 | Ht 62.0 in | Wt 164.6 lb

## 2020-11-11 DIAGNOSIS — I5022 Chronic systolic (congestive) heart failure: Secondary | ICD-10-CM

## 2020-11-11 DIAGNOSIS — I739 Peripheral vascular disease, unspecified: Secondary | ICD-10-CM

## 2020-11-11 DIAGNOSIS — I480 Paroxysmal atrial fibrillation: Secondary | ICD-10-CM | POA: Diagnosis not present

## 2020-11-11 DIAGNOSIS — I25709 Atherosclerosis of coronary artery bypass graft(s), unspecified, with unspecified angina pectoris: Secondary | ICD-10-CM

## 2020-11-11 DIAGNOSIS — I255 Ischemic cardiomyopathy: Secondary | ICD-10-CM | POA: Diagnosis not present

## 2020-11-11 DIAGNOSIS — I10 Essential (primary) hypertension: Secondary | ICD-10-CM

## 2020-11-11 LAB — COMPREHENSIVE METABOLIC PANEL
ALT: 29 IU/L (ref 0–32)
AST: 24 IU/L (ref 0–40)
Albumin/Globulin Ratio: 1.7 (ref 1.2–2.2)
Albumin: 4.2 g/dL (ref 3.8–4.8)
Alkaline Phosphatase: 91 IU/L (ref 44–121)
BUN/Creatinine Ratio: 8 — ABNORMAL LOW (ref 12–28)
BUN: 7 mg/dL — ABNORMAL LOW (ref 8–27)
Bilirubin Total: 0.4 mg/dL (ref 0.0–1.2)
CO2: 25 mmol/L (ref 20–29)
Calcium: 9.5 mg/dL (ref 8.7–10.3)
Chloride: 103 mmol/L (ref 96–106)
Creatinine, Ser: 0.85 mg/dL (ref 0.57–1.00)
Globulin, Total: 2.5 g/dL (ref 1.5–4.5)
Glucose: 103 mg/dL — ABNORMAL HIGH (ref 65–99)
Potassium: 3.9 mmol/L (ref 3.5–5.2)
Sodium: 142 mmol/L (ref 134–144)
Total Protein: 6.7 g/dL (ref 6.0–8.5)
eGFR: 77 mL/min/{1.73_m2} (ref >59)

## 2020-11-11 LAB — CBC
Hematocrit: 39.9 % (ref 34.0–46.6)
Hemoglobin: 13.8 g/dL (ref 11.1–15.9)
MCH: 31.3 pg (ref 26.6–33.0)
MCHC: 34.6 g/dL (ref 31.5–35.7)
MCV: 91 fL (ref 79–97)
Platelets: 206 10*3/uL (ref 150–450)
RBC: 4.41 x10E6/uL (ref 3.77–5.28)
RDW: 12.5 % (ref 11.7–15.4)
WBC: 5 10*3/uL (ref 3.4–10.8)

## 2020-11-11 LAB — LIPID PANEL
Chol/HDL Ratio: 2.8 ratio (ref 0.0–4.4)
Cholesterol, Total: 128 mg/dL (ref 100–199)
HDL: 46 mg/dL (ref >39)
LDL Chol Calc (NIH): 65 mg/dL (ref 0–99)
Triglycerides: 87 mg/dL (ref 0–149)
VLDL Cholesterol Cal: 17 mg/dL (ref 5–40)

## 2020-11-11 LAB — TSH: TSH: 2.56 u[IU]/mL (ref 0.450–4.500)

## 2020-11-11 MED ORDER — FUROSEMIDE 40 MG PO TABS: 40.0000 mg | ORAL_TABLET | Freq: Every day | ORAL | 0 refills | Status: DC

## 2020-11-11 MED ORDER — METOPROLOL SUCCINATE ER 100 MG PO TB24: ORAL_TABLET | ORAL | 3 refills | Status: DC

## 2020-11-11 MED ORDER — ATORVASTATIN CALCIUM 80 MG PO TABS: 80.0000 mg | ORAL_TABLET | Freq: Every day | ORAL | 0 refills | Status: DC

## 2020-11-11 NOTE — Patient Instructions (Addendum)
Medication Instructions:  Your physician recommends that you continue on your current medications as directed. Please refer to the Current Medication list given to you today.   *If you need a refill on your cardiac medications before your next appointment, please call your pharmacy*   Lab Work: Cmp, Cbc, Tsh, Lipids - Today   If you have labs (blood work) drawn today and your tests are completely normal, you will receive your results only by: [name] MyChart Message (if you have MyChart) OR . A paper copy in the mail If you have any lab test that is abnormal or we need to change your treatment, we will call you to review the results.   Testing/Procedures: Your physician has requested that you have an echocardiogram. Echocardiography is a painless test that uses sound waves to create images of your heart. It provides your doctor with information about the size and shape of your heart and how well your heart's chambers and valves are working. This procedure takes approximately one hour. There are no restrictions for this procedure.  Your physician has requested that you have a lower extremity arterial exercise duplex. During this test, exercise and ultrasound are used to evaluate arterial blood flow in the legs. Allow one hour for this exam. There are no restrictions or special instructions.  Follow-Up: At [facility], you and your health needs are our priority.  As part of our continuing mission to provide you with exceptional heart care, we have created designated Provider Care Teams.  These Care Teams include your primary Cardiologist (physician) and Advanced Practice Providers (APPs -  Physician Assistants and Nurse Practitioners) who all work together to provide you with the care you need, when you need it.  We recommend signing up for the patient portal called "MyChart".  Sign up information is provided on this After Visit Summary.  MyChart is used to connect with patients for Virtual Visits  (Telemedicine).  Patients are able to view lab/test results, encounter notes, upcoming appointments, etc.  Non-urgent messages can be sent to your provider as well.   To learn more about what you can do with MyChart, go to [email or web address].    Your next appointment:   12 month(s)  The format for your next appointment:   In Person  Provider:   You may see Lesleigh Noe, MD or one of the following Advanced Practice Providers on your designated Care Team:    Georgie Chard, NP   Follow up with Dr. Allyson Sabal at his next available    Other Instructions None

## 2020-11-12 ENCOUNTER — Other Ambulatory Visit: Payer: Self-pay | Admitting: Physician Assistant

## 2020-11-12 ENCOUNTER — Other Ambulatory Visit: Payer: Self-pay

## 2020-11-12 MED ORDER — FUROSEMIDE 40 MG PO TABS: 20.0000 mg | ORAL_TABLET | Freq: Every day | ORAL | 0 refills | Status: DC

## 2020-12-09 ENCOUNTER — Other Ambulatory Visit (HOSPITAL_COMMUNITY): Payer: No Typology Code available for payment source

## 2020-12-11 ENCOUNTER — Other Ambulatory Visit: Payer: Self-pay | Admitting: Physician Assistant

## 2020-12-17 ENCOUNTER — Other Ambulatory Visit: Payer: Self-pay | Admitting: Physician Assistant

## 2020-12-20 ENCOUNTER — Other Ambulatory Visit: Payer: Self-pay | Admitting: Physician Assistant

## 2021-01-14 ENCOUNTER — Other Ambulatory Visit: Payer: Self-pay | Admitting: Interventional Cardiology

## 2021-01-22 ENCOUNTER — Other Ambulatory Visit: Payer: Self-pay | Admitting: Physician Assistant

## 2021-01-22 DIAGNOSIS — I739 Peripheral vascular disease, unspecified: Secondary | ICD-10-CM

## 2021-02-03 ENCOUNTER — Ambulatory Visit: Payer: No Typology Code available for payment source | Admitting: Cardiovascular Disease

## 2021-02-03 ENCOUNTER — Inpatient Hospital Stay (HOSPITAL_COMMUNITY): Admission: RE | Admit: 2021-02-03 | Payer: No Typology Code available for payment source | Source: Ambulatory Visit

## 2021-02-10 ENCOUNTER — Ambulatory Visit (HOSPITAL_COMMUNITY): Payer: No Typology Code available for payment source | Attending: Internal Medicine

## 2021-02-10 ENCOUNTER — Other Ambulatory Visit: Payer: Self-pay

## 2021-02-10 DIAGNOSIS — I255 Ischemic cardiomyopathy: Secondary | ICD-10-CM | POA: Diagnosis not present

## 2021-02-10 LAB — ECHOCARDIOGRAM COMPLETE
Area-P 1/2: 2.31 cm2
MV M vel: 6.03 m/s
MV Peak grad: 145.4 mmHg
MV VTI: 1.82 cm2
P 1/2 time: 468 msec
S' Lateral: 5.1 cm

## 2021-02-11 ENCOUNTER — Encounter: Payer: Self-pay | Admitting: Physician Assistant

## 2021-02-23 MED ORDER — ENTRESTO 49-51 MG PO TABS: 1.0000 | ORAL_TABLET | Freq: Two times a day (BID) | ORAL | 2 refills | Status: DC

## 2021-02-23 MED ORDER — APIXABAN 5 MG PO TABS: 5.0000 mg | ORAL_TABLET | Freq: Two times a day (BID) | ORAL | 5 refills | Status: DC

## 2021-05-06 ENCOUNTER — Other Ambulatory Visit: Payer: Self-pay

## 2021-05-06 ENCOUNTER — Ambulatory Visit (HOSPITAL_COMMUNITY)
Admission: RE | Admit: 2021-05-06 | Discharge: 2021-05-06 | Disposition: A | Discharge status: Home / Self Care | Payer: No Typology Code available for payment source | Source: Ambulatory Visit | Attending: Cardiology | Admitting: Cardiology

## 2021-05-06 ENCOUNTER — Encounter: Payer: Self-pay | Admitting: Cardiovascular Disease

## 2021-05-06 ENCOUNTER — Ambulatory Visit (INDEPENDENT_AMBULATORY_CARE_PROVIDER_SITE_OTHER): Payer: No Typology Code available for payment source | Admitting: Cardiovascular Disease

## 2021-05-06 ENCOUNTER — Telehealth: Payer: Self-pay | Admitting: Interventional Cardiology

## 2021-05-06 DIAGNOSIS — I739 Peripheral vascular disease, unspecified: Secondary | ICD-10-CM

## 2021-05-06 NOTE — Progress Notes (Signed)
05/06/2021 [name]   1960-08-23  [id number]  Primary Physician Georgianne Fick, MD Primary Cardiologist: Runell Gess MD Roseanne Reno  HPI:  [name] is a 60 y.o.  married African-American female mother of 1, grandmother of 1 grandchild who works as an Scientist, water quality for [employer].  She was referred by Dr. Verdis Prime for peripheral vascular evaluation because of lifestyle limiting claudication.  I last saw her in the office 03/06/2019.  Risk factors include remote tobacco abuse having quit 13 to 14 years ago, treated hypertension hyperlipidemia.  There is no family history of heart disease.  She is never had a stroke but does have a history of ischemic heart disease status post CABG in 07 with non-STEMI 12/04/2017.  She had occluded native vessels with 2 patent vein grafts and moderately severe LV dysfunction.  She did have a flutter and had attempted TEE cardioversion however there was demonstration of left atrial thrombus this never occurred.  She was switched from Coumadin which she was on remotely to Eliquis.  She spontaneously converted to sinus rhythm.  She denies chest pain or shortness of breath.  Since I saw her 2 years ago she continues to remain stable.  She denies chest pain or shortness of breath.  She also denies lifestyle limiting claudication.  She had Dopplers performed today revealed a right ABI of 0.78 and a left of 0.97.   Current Meds  Medication Sig   acetaminophen (TYLENOL) 500 MG tablet Take 1,000 mg by mouth as needed for mild pain.   apixaban (ELIQUIS) 5 MG TABS tablet Take 1 tablet (5 mg total) by mouth 2 (two) times daily.   aspirin EC 81 MG tablet Take 1 tablet (81 mg total) by mouth daily.   atorvastatin (LIPITOR) 80 MG tablet TAKE 1 TABLET(80 MG) BY MOUTH DAILY   furosemide (LASIX) 40 MG tablet TAKE 1 TABLET(40 MG) BY MOUTH DAILY   metoprolol succinate (TOPROL-XL) 100 MG 24 hr tablet TAKE 1 TABLET(100 MG) BY MOUTH TWICE  DAILY   nitroGLYCERIN (NITROSTAT) 0.4 MG SL tablet PLEASE SEE ATTACHED FOR DETAILED DIRECTIONS   sacubitril-valsartan (ENTRESTO) 49-51 MG Take 1 tablet by mouth 2 (two) times daily.   triamcinolone cream (KENALOG) 0.1 % Apply 1 application topically at bedtime.     Allergies  Allergen Reactions   Other Itching    GEL ON ELECTRODES FOR EKG   Adhesive [Tape]     Social History   Socioeconomic History   Marital status: Married    Spouse name: Not on file   Number of children: Not on file   Years of education: Not on file   Highest education level: Not on file  Occupational History   Not on file  Tobacco Use   Smoking status: Former    Packs/day: 1.00    Years: 30.00    Pack years: 30.00    Types: Cigarettes   Smokeless tobacco: Never   Tobacco comments:    "quit smoking cigarettes in ~ 2007"  Vaping Use   Vaping Use: Never used  Substance and Sexual Activity   Alcohol use: Yes    Alcohol/week: 1.0 standard drink    Types: 1 Cans of beer per week    Comment: occ   Drug use: No   Sexual activity: Not Currently  Other Topics Concern   Not on file  Social History Narrative   Not on file   Social Determinants of Health   Financial Resource  Strain: Not on file  Food Insecurity: Not on file  Transportation Needs: Not on file  Physical Activity: Not on file  Stress: Not on file  Social Connections: Not on file  Intimate Partner Violence: Not on file     Review of Systems: General: negative for chills, fever, night sweats or weight changes.  Cardiovascular: negative for chest pain, dyspnea on exertion, edema, orthopnea, palpitations, paroxysmal nocturnal dyspnea or shortness of breath Dermatological: negative for rash Respiratory: negative for cough or wheezing Urologic: negative for hematuria Abdominal: negative for nausea, vomiting, diarrhea, bright red blood per rectum, melena, or hematemesis Neurologic: negative for visual changes, syncope, or dizziness All  other systems reviewed and are otherwise negative except as noted above.    Blood pressure 132/64, pulse (!) 59, height 5\' 2"  (1.575 m), weight 170 lb 12.8 oz (77.5 kg), SpO2 99 %.  General appearance: alert and no distress Neck: no adenopathy, no carotid bruit, no JVD, supple, symmetrical, trachea midline, and thyroid not enlarged, symmetric, no tenderness/mass/nodules Lungs: clear to auscultation bilaterally Heart: regular rate and rhythm, S1, S2 normal, no murmur, click, rub or gallop Extremities: extremities normal, atraumatic, no cyanosis or edema Pulses: 2+ and symmetric Diminished pedal pulses Skin: Skin color, texture, turgor normal. No rashes or lesions Neurologic: Grossly normal  EKG sinus bradycardia 59 with lateral T wave inversion.  Personally reviewed this EKG.  ASSESSMENT AND PLAN:   Peripheral vascular disease, unspecified (HCC) [name] returns today for follow-up.  I last saw her 2 years ago as a referral from Dr. for PAD.  She did have high-grade disease in her distal right SFA and we had arranged for her to undergo angiography but unfortunately her husband had a stroke at that time and her procedure was canceled.  Over the last 2 years she really denies lifestyle limiting claudication.  She did have ABIs done today revealing a right ABI of 0.78 and a left of 0.97.  Given her lack of symptoms I do not feel compelled to pursue an invasive strategy.  We will continue to follow her noninvasively on an annual basis.     Katrinka Blazing MD FACP,FACC,FAHA, [facility] 05/06/2021 10:28 AM

## 2021-05-06 NOTE — Telephone Encounter (Signed)
Returned call to pt.  She has been made of her Vas [place] ABI.

## 2021-05-06 NOTE — Telephone Encounter (Signed)
Pt is returning call regarding the results from her tests today. Please advise pt further

## 2021-05-06 NOTE — Assessment & Plan Note (Signed)
[name] returns today for follow-up.  I last saw her 2 years ago as a referral from Dr. Katrinka Blazing for PAD.  She did have high-grade disease in her distal right SFA and we had arranged for her to undergo angiography but unfortunately her husband had a stroke at that time and her procedure was canceled.  Over the last 2 years she really denies lifestyle limiting claudication.  She did have ABIs done today revealing a right ABI of 0.78 and a left of 0.97.  Given her lack of symptoms I do not feel compelled to pursue an invasive strategy.  We will continue to follow her noninvasively on an annual basis.

## 2021-05-06 NOTE — Patient Instructions (Signed)
Medication Instructions:  Your physician recommends that you continue on your current medications as directed. Please refer to the Current Medication list given to you today.  *If you need a refill on your cardiac medications before your next appointment, please call your pharmacy*   Testing/Procedures: Your physician has requested that you have a lower extremity arterial duplex. This test is an ultrasound of the arteries in the legs. It looks at arterial blood flow in the legs. Allow one hour for Lower Arterial scans. There are no restrictions or special instructions.  Your physician has requested that you have an ankle brachial index (ABI). During this test an ultrasound and blood pressure cuff are used to evaluate the arteries that supply the arms and legs with blood. Allow thirty minutes for this exam. There are no restrictions or special instructions.  To be done in Oct. 2023. These procedures are done at [address]  Follow-Up: At [facility], you and your health needs are our priority.  As part of our continuing mission to provide you with exceptional heart care, we have created designated Provider Care Teams.  These Care Teams include your primary Cardiologist (physician) and Advanced Practice Providers (APPs -  Physician Assistants and Nurse Practitioners) who all work together to provide you with the care you need, when you need it.  We recommend signing up for the patient portal called "MyChart".  Sign up information is provided on this After Visit Summary.  MyChart is used to connect with patients for Virtual Visits (Telemedicine).  Patients are able to view lab/test results, encounter notes, upcoming appointments, etc.  Non-urgent messages can be sent to your provider as well.   To learn more about what you can do with MyChart, go to [email or web address].    Your next appointment:   12 month(s)  The format for your next appointment:   In Person  Provider:    Jonathan Berry, MD 

## 2021-07-14 ENCOUNTER — Encounter: Payer: Self-pay | Admitting: Interventional Cardiology

## 2021-07-14 ENCOUNTER — Other Ambulatory Visit: Payer: Self-pay | Admitting: Interventional Cardiology

## 2021-07-14 NOTE — Telephone Encounter (Signed)
Pt last saw Dr Allyson Sabal 05/06/21, last labs 11/11/20 Creat 0.85, age 60. Weight 77.5kg, based on specified criteria pt is on appropriate dosage of Eliquis 5mg  BID for afib.  Will refill rx.

## 2021-07-15 MED ORDER — ENTRESTO 49-51 MG PO TABS: 1.0000 | ORAL_TABLET | Freq: Two times a day (BID) | ORAL | 1 refills | Status: DC

## 2021-11-08 ENCOUNTER — Other Ambulatory Visit: Payer: Self-pay | Admitting: Physician Assistant

## 2021-11-25 ENCOUNTER — Ambulatory Visit: Payer: No Typology Code available for payment source | Admitting: Physician Assistant

## 2021-11-29 NOTE — Progress Notes (Signed)
?Cardiology Office Note:   ? ?Date:  12/01/2021  ? ?ID:  Candice ShortJanet Thomas, DOB [birth year], MRN [id number] ? ?PCP:  Georgianne Fickamachandran, Ajith, MD ?  ?[facility] Providers ?Cardiologist:  Lesleigh NoeHenry W Smith III, MD ?Click to update primary MD,subspecialty MD or APP then REFRESH:1}   ? ?Referring MD: Georgianne Fickamachandran, Ajith, MD  ? ?Chief Complaint: follow-up CAD ? ?History of Present Illness:   ? ?[name] with a hx of CAD s/p CABG with MV repair 2007, prior LAD Promus DES 2003, SVG to RCA DES 09/2015,  NSTEMI 11/2017, ICM, atrial fibrillation, claudication, hypertension, and hyperlipidemia.  ? ?Most recent cardiac cath 12/09/17 revealed new ostial occlusion of likely sequential SVG-AM-dRCA (proximal to stent from March 2017).  This is in addition to known CTO of SVG to OM 2.  Severe native disease with occluded mid RCA, mid circumflex and mid LAD as well as diagonal 2.  Now 2 out of 4-5 grafts remain patent - SVG-Diag2 is patent with collaterals filling OM 2 and distal circumflex, LIMA to LAD is patent.  She was found to have new onset atrial fibrillation RVR.  ? ?She was referred to Dr. Allyson SabalBerry for PAD and last seen by him on 05/06/2021.  Testing revealed high-grade disease in her distal right SFA and he arranged angiography, however her husband had a stroke at the time and the procedure was canceled.  An appointment 04/2021 she denies lifestyle limiting claudication.  Conservative management continued. ? ?She was last seen in our office on 11/11/2020 by Jacolyn ReedyMichele Lenze, PA at which time echo revealed stable EF at 45-45%, MV repair functioning normally with just trivial leakage. Lasix was reduced in the setting of lab work revealed mild dehydration.  One year follow-up was recommended.  ? ?Today, she is here alone for her follow-up.  She reports she has been doing well and is just here for a checkup.  She has noticed some SOB since she reduced her Lasix to 20 mg a day.  Approximately once every 2 weeks,  she takes Lasix 40 mg for increased SOB.  No orthopnea, PND, edema. 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 When she is not working she is taking care of her husband who had a stroke so she often feels very fatigued.  She is currently not exercising on a consistent basis. Plans to retire in 1 month and is hopeful that she will have more time to exercise at that point.  No claudication symptoms. Does not monitor BP at home. She denies chest pain, palpitations, melena, hematuria, hemoptysis, diaphoresis, weakness, presyncope, and syncope. ? ?Past Medical History:  ?Diagnosis Date  ? Atrial fibrillation (HCC)   ? Atrial flutter (HCC)   ? CHF (congestive heart failure) (HCC)   ? Coronary atherosclerosis of native coronary artery   ? Echocardiogram   ? Echocardiogram 7/22: EF 40-45, inf and inf-lat AK, mod reduced RVSF, severe LAE, trivial MR, mild AI, mild AV sclerosis w/o AS  ? HTN (hypertension)   ? Hx of CABG   ? LIMA-LAD, SVG-DIAG, SVG-OM, SVG-RCA  ? Hypercholesteremia   ? Myocardial infarct [facility](HCC) 2007  ? NSTEMI (non-ST elevated myocardial infarction) (HCC) 10/13/2015  ? Obesity   ? ? ?Past Surgical History:  ?Procedure Laterality Date  ? ABDOMINAL HYSTERECTOMY    ? BIOPSY  12/16/2019  ? Procedure: BIOPSY;  Surgeon: Lemar LoftyMansouraty, Gabriel Jr., MD;  [facility];  Service: Gastroenterology;;  ? CARDIAC CATHETERIZATION N/A 10/14/2015  ?  Procedure: Left Heart Cath and Cors/Grafts Angiography;  Surgeon: Marykay Lex, MD;  Location: [facility] INVASIVE CV LAB;  Service: Cardiovascular;  Laterality: N/A;  ? CARDIAC CATHETERIZATION N/A 10/14/2015  ? Procedure: Coronary Stent Intervention;  Surgeon: Marykay Lex, MD;  Location: [facility] INVASIVE CV LAB;  Service: Cardiovascular;  Laterality: N/A;  ? CESAREAN SECTION  1985  ? CORONARY ANGIOPLASTY WITH STENT PLACEMENT  2003  ? LAD Promus DES /notes 10/13/2015  ? CORONARY ARTERY BYPASS GRAFT  03/2006  ? "CABG X3"  ? ESOPHAGOGASTRODUODENOSCOPY N/A 12/16/2019   ? Procedure: ESOPHAGOGASTRODUODENOSCOPY (EGD);  Surgeon: Lemar Lofty., MD;  Location: [facility] ENDOSCOPY;  Service: Gastroenterology;  Laterality: N/A;  ? FOREIGN BODY REMOVAL  12/16/2019  ? Procedure: FOREIGN BODY REMOVAL;  Surgeon: Meridee Score Netty Starring., MD;  Location: [facility] ENDOSCOPY;  Service: Gastroenterology;;  ? LEFT HEART CATH AND CORS/GRAFTS ANGIOGRAPHY N/A 12/09/2017  ? Procedure: LEFT HEART CATH AND CORS/GRAFTS ANGIOGRAPHY;  Surgeon: Marykay Lex, MD;  Location: [facility] INVASIVE CV LAB;  Service: Cardiovascular;  Laterality: N/A;  ? MITRAL VALVE REPAIR    ? [name] 10/13/2015  ? TEE WITHOUT CARDIOVERSION N/A 12/07/2017  ? Procedure: TRANSESOPHAGEAL ECHOCARDIOGRAM (TEE);  Surgeon: Chrystie Nose, MD;  Location: [facility] ENDOSCOPY;  Service: Cardiovascular;  Laterality: N/A;  ? ? ?Current Medications: ?Current Meds  ?Medication Sig  ? acetaminophen (TYLENOL) 500 MG tablet Take 1,000 mg by mouth as needed for mild pain.  ? aspirin EC 81 MG tablet Take 1 tablet (81 mg total) by mouth daily.  ? atorvastatin (LIPITOR) 80 MG tablet TAKE 1 TABLET(80 MG) BY MOUTH DAILY  ? ELIQUIS 5 MG TABS tablet TAKE 1 TABLET(5 MG) BY MOUTH TWICE DAILY  ? furosemide (LASIX) 20 MG tablet Take 20 mg by mouth daily.  ? metoprolol succinate (TOPROL-XL) 100 MG 24 hr tablet TAKE 1 TABLET(100 MG) BY MOUTH TWICE DAILY  ? sacubitril-valsartan (ENTRESTO) 49-51 MG Take 1 tablet by mouth 2 (two) times daily.  ? triamcinolone cream (KENALOG) 0.1 % Apply 1 application topically at bedtime.  ? [DISCONTINUED] nitroGLYCERIN (NITROSTAT) 0.4 MG SL tablet PLEASE SEE ATTACHED FOR DETAILED DIRECTIONS  ?  ? ?Allergies:   Other and Adhesive [tape]  ? ?Social History  ? ?Socioeconomic History  ? Marital status: Married  ?  Spouse name: Not on file  ? Number of children: Not on file  ? Years of education: Not on file  ? Highest education level: Not on file  ?Occupational History  ? Not on file  ?Tobacco Use  ? Smoking status: Former  ?  Packs/day: 1.00  ?   Years: 30.00  ?  Pack years: 30.00  ?  Types: Cigarettes  ? Smokeless tobacco: Never  ? Tobacco comments:  ?  "quit smoking cigarettes in ~ 2007"  ?Vaping Use  ? Vaping Use: Never used  ?Substance and Sexual Activity  ? Alcohol use: Yes  ?  Alcohol/week: 1.0 standard drink  ?  Types: 1 Cans of beer per week  ?  Comment: occ  ? Drug use: No  ? Sexual activity: Not Currently  ?Other Topics Concern  ? Not on file  ?Social History Narrative  ? Not on file  ? ?Social Determinants of Health  ? ?Financial Resource Strain: Not on file  ?Food Insecurity: Not on file  ?Transportation Needs: Not on file  ?Physical Activity: Not on file  ?Stress: Not on file  ?Social Connections: Not on file  ?  ? ?Family History: ?The patient's family history  includes Hypertension in her brother, father, and sister; Stroke in her sister. There is no history of Heart attack. ? ?ROS:   ?Please see the history of present illness.    ?+ SOB ? All other systems reviewed and are negative. ? ?Labs/Other Studies Reviewed:   ? ?The following studies were reviewed today: ? ?VAS [place] ABI 05/06/21 ? ?Right: Resting right ankle-brachial index indicates moderate right lower  ?extremity arterial disease. The right toe-brachial index is normal.  ? ?Left: Resting left ankle-brachial index is within normal range. No  ?evidence of significant left lower extremity arterial disease. The left  ?toe-brachial index is normal.  ? ? ?Echo 02/10/21 ? ?1. Left ventricular ejection fraction, by estimation, is 40 to 45%. The  ?left ventricle has mildly decreased function. The left ventricle  ?demonstrates regional wall motion abnormalities (see scoring  ?diagram/findings for description). The left ventricular  ? internal cavity size was moderately dilated. Left ventricular diastolic  ?function could not be evaluated. There is severe akinesis of the left  ?ventricular, basal-mid inferior wall and inferolateral wall.  ? 2. Right ventricular systolic function is moderately  reduced. The right  ?ventricular size is normal.  ? 3. Left atrial size was severely dilated.  ? 4. The mitral valve has been repaired/replaced. Trivial mitral valve  ?regurgitation. No evidence of mitral ste

## 2021-12-01 ENCOUNTER — Ambulatory Visit (INDEPENDENT_AMBULATORY_CARE_PROVIDER_SITE_OTHER): Payer: No Typology Code available for payment source | Admitting: Nurse Practitioner

## 2021-12-01 ENCOUNTER — Encounter: Payer: Self-pay | Admitting: Nurse Practitioner

## 2021-12-01 VITALS — BP 144/74 | HR 65 | Ht 62.5 in | Wt 180.6 lb

## 2021-12-01 DIAGNOSIS — I10 Essential (primary) hypertension: Secondary | ICD-10-CM

## 2021-12-01 DIAGNOSIS — I2510 Atherosclerotic heart disease of native coronary artery without angina pectoris: Secondary | ICD-10-CM

## 2021-12-01 DIAGNOSIS — I739 Peripheral vascular disease, unspecified: Secondary | ICD-10-CM

## 2021-12-01 DIAGNOSIS — I480 Paroxysmal atrial fibrillation: Secondary | ICD-10-CM | POA: Diagnosis not present

## 2021-12-01 DIAGNOSIS — E785 Hyperlipidemia, unspecified: Secondary | ICD-10-CM

## 2021-12-01 MED ORDER — NITROGLYCERIN 0.4 MG SL SUBL: SUBLINGUAL_TABLET | SUBLINGUAL | 3 refills | Status: AC

## 2021-12-01 NOTE — Patient Instructions (Signed)
Medication Instructions:  ? ? ?Your physician recommends that you continue on your current medications as directed. Please refer to the Current Medication list given to you today. ? ? ?*If you need a refill on your cardiac medications before your next appointment, please call your pharmacy* ? ? ?Lab Work: ? ?None ordered.  ? ?If you have labs (blood work) drawn today and your tests are completely normal, you will receive your results only by: ?MyChart Message (if you have MyChart) OR ?A paper copy in the mail ?If you have any lab test that is abnormal or we need to change your treatment, we will call you to review the results. ? ? ?Testing/Procedures: ? ?None ordered.  ? ? ?Follow-Up: ?At [facility], you and your health needs are our priority.  As part of our continuing mission to provide you with exceptional heart care, we have created designated Provider Care Teams.  These Care Teams include your primary Cardiologist (physician) and Advanced Practice Providers (APPs -  Physician Assistants and Nurse Practitioners) who all work together to provide you with the care you need, when you need it. ? ?We recommend signing up for the patient portal called "MyChart".  Sign up information is provided on this After Visit Summary.  MyChart is used to connect with patients for Virtual Visits (Telemedicine).  Patients are able to view lab/test results, encounter notes, upcoming appointments, etc.  Non-urgent messages can be sent to your provider as well.   ?To learn more about what you can do with MyChart, go to [email or web address].   ? ?Your next appointment:   ?6 month(s) ? ?The format for your next appointment:   ?In Person ? ?Provider:   ?Lesleigh Noe, MD   ? ? ?Other Instructions ? ? ?HOW TO TAKE YOUR BLOOD PRESSURE: ?Rest 5 minutes before taking your blood pressure. ? Don?t smoke or drink caffeinated beverages for at least 30 minutes before. ?Take your blood pressure before (not after) you eat. ?Sit  comfortably with your back supported and both feet on the floor (don?t cross your legs). ?Elevate your arm to heart level on a table or a desk. ?Use the proper sized cuff. It should fit smoothly and snugly around your bare upper arm. There should be enough room to slip a fingertip under the cuff. The bottom edge of the cuff should be 1 inch above the crease of the elbow. HOW TO TAKE YOUR BLOOD PRESSURE: ?Rest 5 minutes before taking your blood pressure. ? Don?t smoke or drink caffeinated beverages for at least 30 minutes before. ?Take your blood pressure before (not after) you eat. ?Sit comfortably with your back supported and both feet on the floor (don?t cross your legs). ?Elevate your arm to heart level on a table or a desk. ?Use the proper sized cuff. It should fit smoothly and snugly around your bare upper arm. There should be enough room to slip a fingertip under the cuff. The bottom edge of the cuff should be 1 inch above the crease of the elbow.  ?Blood Pressure Record Sheet ?To take your blood pressure, you will need a blood pressure machine. You may be prescribed one, or you can buy a blood pressure machine (blood pressure monitor) at your clinic, drug store, or online. When choosing one, look for these features: ?An automatic monitor that has an arm cuff. ?A cuff that wraps snugly, but not too tightly, around your upper arm. You should be able to fit only one finger between your  arm and the cuff. ?A device that stores blood pressure reading results. ?Do not choose a monitor that measures your blood pressure from your wrist or finger. ?Follow your health care provider's instructions for how to take your blood pressure. To use this form: ?Get one reading in the morning (a.m.) before you take any medicines. ?Get one reading in the evening (p.m.) before supper. ?Take at least two readings with each blood pressure check. This makes sure the results are correct. Wait 1-2 minutes between measurements. ?Write  down the results in the spaces on this form. ?Repeat this once a week, or as told by your health care provider. ?Make a follow-up appointment with your health care provider to discuss the results. ?Blood pressure log ?Date: _______________________ ?a.m. _____________________(1st reading) _____________________(2nd reading) ?p.m. _____________________(1st reading) _____________________(2nd reading) ?Date: _______________________ ?a.m. _____________________(1st reading) _____________________(2nd reading) ?p.m. _____________________(1st reading) _____________________(2nd reading) ?Date: _______________________ ?a.m. _____________________(1st reading) _____________________(2nd reading) ?p.m. _____________________(1st reading) _____________________(2nd reading) ?Date: _______________________ ?a.m. _____________________(1st reading) _____________________(2nd reading) ?p.m. _____________________(1st reading) _____________________(2nd reading) ?Date: _______________________ ?a.m. _____________________(1st reading) _____________________(2nd reading) ?p.m. _____________________(1st reading) _____________________(2nd reading) ?This information is not intended to replace advice given to you by your health care provider. Make sure you discuss any questions you have with your health care provider. ?Document Revised: 03/19/2021 Document Reviewed: 03/19/2021 ?Elsevier Patient Education ? 2023 Elsevier Inc. ? ?Please send BP readings through mychart in two weeks.  ? ?Important Information About Sugar ? ? ? ? ?  ?

## 2021-12-07 ENCOUNTER — Other Ambulatory Visit: Payer: Self-pay | Admitting: *Deleted

## 2021-12-07 MED ORDER — FUROSEMIDE 20 MG PO TABS: 20.0000 mg | ORAL_TABLET | Freq: Every day | ORAL | 1 refills | Status: AC

## 2022-01-08 ENCOUNTER — Encounter: Payer: Self-pay | Admitting: Interventional Cardiology

## 2022-01-08 MED ORDER — ATORVASTATIN CALCIUM 80 MG PO TABS: ORAL_TABLET | ORAL | 3 refills | Status: AC

## 2022-02-01 ENCOUNTER — Other Ambulatory Visit: Payer: Self-pay

## 2022-02-01 MED ORDER — ENTRESTO 49-51 MG PO TABS: 1.0000 | ORAL_TABLET | Freq: Two times a day (BID) | ORAL | 1 refills | Status: DC

## 2022-02-06 ENCOUNTER — Encounter: Payer: Self-pay | Admitting: Interventional Cardiology

## 2022-02-08 MED ORDER — APIXABAN 5 MG PO TABS: ORAL_TABLET | ORAL | 5 refills | Status: DC

## 2022-03-02 ENCOUNTER — Other Ambulatory Visit: Payer: Self-pay

## 2022-03-02 MED ORDER — ENTRESTO 49-51 MG PO TABS: 1.0000 | ORAL_TABLET | Freq: Two times a day (BID) | ORAL | 2 refills | Status: AC

## 2022-03-15 ENCOUNTER — Telehealth: Payer: Self-pay | Admitting: Pharmacist

## 2022-03-15 NOTE — Telephone Encounter (Signed)
PA request for Eliquis received. PA approved.

## 2022-03-16 ENCOUNTER — Encounter: Payer: Self-pay | Admitting: Interventional Cardiology

## 2022-03-17 ENCOUNTER — Other Ambulatory Visit: Payer: Self-pay

## 2022-03-17 MED ORDER — METOPROLOL SUCCINATE ER 100 MG PO TB24: ORAL_TABLET | ORAL | 2 refills | Status: DC

## 2022-03-17 MED ORDER — METOPROLOL SUCCINATE ER 100 MG PO TB24: 100.0000 mg | ORAL_TABLET | Freq: Two times a day (BID) | ORAL | 8 refills | Status: DC

## 2022-03-18 ENCOUNTER — Other Ambulatory Visit: Payer: Self-pay

## 2022-03-18 MED ORDER — METOPROLOL SUCCINATE ER 100 MG PO TB24: 100.0000 mg | ORAL_TABLET | Freq: Two times a day (BID) | ORAL | 10 refills | Status: AC

## 2022-03-18 NOTE — Telephone Encounter (Signed)
Refill for metoprolol succinate (TOPROL-XL) 100 MG 24 hr tablet has been sent to [facility] on [address]

## 2022-05-06 ENCOUNTER — Encounter (HOSPITAL_COMMUNITY): Payer: No Typology Code available for payment source

## 2022-06-08 ENCOUNTER — Ambulatory Visit: Payer: No Typology Code available for payment source | Admitting: Interventional Cardiology

## 2022-07-20 ENCOUNTER — Ambulatory Visit: Payer: No Typology Code available for payment source | Admitting: Physician Assistant

## 2022-08-19 ENCOUNTER — Other Ambulatory Visit: Payer: Self-pay | Admitting: *Deleted

## 2022-08-19 ENCOUNTER — Ambulatory Visit: Payer: Commercial Managed Care - HMO | Attending: Interventional Cardiology | Admitting: Physician Assistant

## 2022-08-19 ENCOUNTER — Encounter: Payer: Self-pay | Admitting: Physician Assistant

## 2022-08-19 VITALS — BP 134/84 | HR 59 | Ht 62.5 in | Wt 169.8 lb

## 2022-08-19 DIAGNOSIS — I5022 Chronic systolic (congestive) heart failure: Secondary | ICD-10-CM

## 2022-08-19 DIAGNOSIS — I4891 Unspecified atrial fibrillation: Secondary | ICD-10-CM

## 2022-08-19 DIAGNOSIS — I480 Paroxysmal atrial fibrillation: Secondary | ICD-10-CM

## 2022-08-19 DIAGNOSIS — I2510 Atherosclerotic heart disease of native coronary artery without angina pectoris: Secondary | ICD-10-CM

## 2022-08-19 DIAGNOSIS — I10 Essential (primary) hypertension: Secondary | ICD-10-CM

## 2022-08-19 DIAGNOSIS — I739 Peripheral vascular disease, unspecified: Secondary | ICD-10-CM

## 2022-08-19 DIAGNOSIS — E785 Hyperlipidemia, unspecified: Secondary | ICD-10-CM

## 2022-08-19 MED ORDER — APIXABAN 5 MG PO TABS: ORAL_TABLET | ORAL | 5 refills | Status: AC

## 2022-08-19 NOTE — Telephone Encounter (Signed)
Eliquis 5mg  refill request received. Patient is 62 years old, weight-81.9kg, Crea-0.90 on 05/26/22 via [place] from [facility] : [facility] office, Diagnosis-Afib, and last seen by Florene Glen on 12/01/2021. Dose is appropriate based on dosing criteria. Will send in refill to requested pharmacy.

## 2022-08-19 NOTE — Patient Instructions (Signed)
Medication Instructions:  Your physician recommends that you continue on your current medications as directed. Please refer to the Current Medication list given to you today.  *If you need a refill on your cardiac medications before your next appointment, please call your pharmacy*   Lab Work: None ordered If you have labs (blood work) drawn today and your tests are completely normal, you will receive your results only by: [place] (if you have MyChart) OR A paper copy in the mail If you have any lab test that is abnormal or we need to change your treatment, we will call you to review the results.   Follow-Up: At [facility], you and your health needs are our priority.  As part of our continuing mission to provide you with exceptional heart care, we have created designated Provider Care Teams.  These Care Teams include your primary Cardiologist (physician) and Advanced Practice Providers (APPs -  Physician Assistants and Nurse Practitioners) who all work together to provide you with the care you need, when you need it.   Your next appointment:   3-4 month(s)  Provider:   Dr Gasper Sells  Other Instructions Check your blood pressure daily, one hour after taking your morning medications and keep a log.

## 2022-08-19 NOTE — Progress Notes (Signed)
Office Visit    Patient Name: [name] Date of Encounter: 08/19/2022  PCP:  Merrilee Seashore, [place]  Cardiologist:  Werner Lean, MD  Advanced Practice Provider:  No care team member to display Electrophysiologist:  None    HPI    [name] is a 62 y.o. female with a past medical history significant for CAD status post CABG with MV repair 2007, prior LAD Promus DES 2003, SVG to RCA DES 09/2015, NSTEMI 11/2017, ICM, atrial fibrillation, claudication, hypertension, and hyperlipidemia presents today for follow-up appointment.  Most recent cardiac catheterization 12/09/2017 revealed new ostial occlusion of the likely sequential SVG-AM-dRCA (proximal to stent from March 2017).  In addition CTO of SVG to OM 2.  Severe native disease with occluded mid RCA, mid circumflex and mid LAD as well as diagonal 2.  Now 2 out of 4-5 grafts remain patent.  SVG to diagonal 2 is patent with collaterals filling OM 2 and distal circumflex, LIMA to LAD is patent.  Found to have new onset atrial fibrillation with RVR.  Refer to Dr. Gwenlyn Found for PAD and was last seen by him 05/06/2021.  Testing revealed high-grade disease in her distal right SFA and arranged angiography, however her husband had a stroke at that time and the procedure was canceled.  At appointment 04/2021 she denied lifestyle limiting claudication.  Conservative management continued.  Seen by Estella Husk, PA 11/11/2020 at which time echocardiogram revealed EF 45 to 55%, MV repair functioning normally with just a trivial leak.  Lasix was reduced the setting of lab work revealing mild dehydration.  1 year follow-up was recommended.  She was seen by Christen Bame or 11/2021 and at that time she was doing well.  She notices shortness of breath since she reduced her Lasix to 20 mg daily.  Approximately once every 2 weeks, she takes her Lasix 40 mg for increased shortness of breath.  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  Currently not exercising on a consistent basis.  When she is not working she is taking care of her husband who had a stroke.  She was planning on retiring and was hoping to have more time for exercise at that point.  Still no claudication symptoms.  Today, she tells me that she just moved and has been walking several times since then.  She does not have any swelling or shortness of breath.  No chest pain.  She needs a refill of her Eliquis we will take care of that for her today.  No claudication symptoms.  She recently retired so life has been much less stressful.  Reports no shortness of breath nor dyspnea on exertion. Reports no chest pain, pressure, or tightness. No edema, orthopnea, PND. Reports no palpitations.   Past Medical History    Past Medical History:  Diagnosis Date   Atrial fibrillation [facility])    Atrial flutter (HCC)    CHF (congestive heart failure) ([place])    Coronary atherosclerosis of native coronary artery    Echocardiogram    Echocardiogram 7/22: EF 40-45, inf and inf-lat AK, mod reduced RVSF, severe LAE, trivial MR, mild AI, mild AV sclerosis w/o AS   HTN (hypertension)    Hx of CABG    LIMA-LAD, SVG-DIAG, SVG-OM, SVG-RCA   Hypercholesteremia    Myocardial infarct [facility]) 2007   NSTEMI (non-ST elevated myocardial infarction) ([place]) 10/13/2015   Obesity    Past Surgical  History:  Procedure Laterality Date   ABDOMINAL HYSTERECTOMY     BIOPSY  12/16/2019   Procedure: BIOPSY;  Surgeon: Rush Landmark Telford Nab., MD;  Location: [place];  Service: Gastroenterology;;   CARDIAC CATHETERIZATION N/A 10/14/2015   Procedure: Left Heart Cath and Cors/Grafts Angiography;  Surgeon: Leonie Man, MD;  Location: [place] CV LAB;  Service: Cardiovascular;  Laterality: N/A;   CARDIAC CATHETERIZATION N/A 10/14/2015   Procedure: Coronary Stent Intervention;  Surgeon: Leonie Man, MD;  Location: [place] CV LAB;  Service:  Cardiovascular;  Laterality: N/A;   Alpha WITH STENT PLACEMENT  2003   LAD Promus DES /notes 10/13/2015   CORONARY ARTERY BYPASS GRAFT  03/2006   "CABG X3"   ESOPHAGOGASTRODUODENOSCOPY N/A 12/16/2019   Procedure: ESOPHAGOGASTRODUODENOSCOPY (EGD);  Surgeon: Irving Copas., MD;  Location: [place];  Service: Gastroenterology;  Laterality: N/A;   FOREIGN BODY REMOVAL  12/16/2019   Procedure: FOREIGN BODY REMOVAL;  Surgeon: Rush Landmark Telford Nab., MD;  Location: [place];  Service: Gastroenterology;;   LEFT HEART CATH AND CORS/GRAFTS ANGIOGRAPHY N/A 12/09/2017   Procedure: LEFT HEART CATH AND CORS/GRAFTS ANGIOGRAPHY;  Surgeon: Leonie Man, MD;  Location: [place] CV LAB;  Service: Cardiovascular;  Laterality: N/A;   MITRAL VALVE REPAIR     /notes 10/13/2015   TEE WITHOUT CARDIOVERSION N/A 12/07/2017   Procedure: TRANSESOPHAGEAL ECHOCARDIOGRAM (TEE);  Surgeon: Pixie Casino, MD;  Location[facility] ENDOSCOPY;  Service: Cardiovascular;  Laterality: N/A;    Allergies  Allergies  Allergen Reactions   Other Itching    GEL ON ELECTRODES FOR EKG   Adhesive [Tape]     EKGs/Labs/Other Studies Reviewed:   The following studies were reviewed today: VAS [place] ABI 05/06/21   Right: Resting right ankle-brachial index indicates moderate right lower  extremity arterial disease. The right toe-brachial index is normal.   Left: Resting left ankle-brachial index is within normal range. No  evidence of significant left lower extremity arterial disease. The left  toe-brachial index is normal.      Echo 02/10/21   1. Left ventricular ejection fraction, by estimation, is 40 to 45%. The  left ventricle has mildly decreased function. The left ventricle  demonstrates regional wall motion abnormalities (see scoring  diagram/findings for description). The left ventricular   internal cavity size was moderately dilated. Left ventricular diastolic   function could not be evaluated. There is severe akinesis of the left  ventricular, basal-mid inferior wall and inferolateral wall.   2. Right ventricular systolic function is moderately reduced. The right  ventricular size is normal.   3. Left atrial size was severely dilated.   4. The mitral valve has been repaired/replaced. Trivial mitral valve  regurgitation. No evidence of mitral stenosis. There is a prosthetic  annuloplasty ring present in the mitral position.   5. The aortic valve is tricuspid. Aortic valve regurgitation is mild.  Mild aortic valve sclerosis is present, with no evidence of aortic valve  stenosis. Aortic regurgitation PHT measures 468 msec.   6. The inferior vena cava is normal in size with greater than 50%  respiratory variability, suggesting right atrial pressure of 3 mmHg.   Comparison(s): Changes from prior study are noted. 06/29/2019: LVEF  35-40%.   FINDINGS   Left Ventricle: Left ventricular ejection fraction, by estimation, is 40  to 45%. The left ventricle has mildly decreased function. The left  ventricle demonstrates regional wall motion abnormalities. Severe akinesis  of the  left ventricular, basal-mid  inferior wall and inferolateral wall. The left ventricular internal cavity  size was moderately dilated. There is no left ventricular hypertrophy.  Left ventricular diastolic function could not be evaluated due to mitral  valve repair. Left ventricular  diastolic function could not be evaluated.  Right Ventricle: The right ventricular size is normal. No increase in  right ventricular wall thickness. Right ventricular systolic function is  moderately reduced.  Left Atrium: Left atrial size was severely dilated.  Right Atrium: Right atrial size was normal in size.  Pericardium: There is no evidence of pericardial effusion.  Mitral Valve: The mitral valve has been repaired/replaced. Trivial mitral  valve regurgitation. There is a prosthetic  annuloplasty ring present in  the mitral position. No evidence of mitral valve stenosis. MV peak  gradient, 15.8 mmHg. The mean mitral  valve gradient is 4.0 mmHg.  Tricuspid Valve: The tricuspid valve is grossly normal. Tricuspid valve  regurgitation is trivial.  Aortic Valve: The aortic valve is tricuspid. Aortic valve regurgitation is  mild. Aortic regurgitation PHT measures      LHC 12/09/17   Prox Cx lesion is 70% stenosed. Ost 1st Mrg to 1st Mrg lesion is 65% stenosed. Prox Cx to Mid Cx lesion is 100% stenosed - after 1st Mrg SVG-2nd Mrg: Origin lesion is 100% stenosed. Known CTO. Prox LAD lesion is 75% stenosed pre-2nd Diag. Prox LAD to Mid LAD lesion is 100% stenosed AFTER 2nd Diag & SP2 (SP1 & SP2 fill rPDA with faint collaterals) LIMA-LAD graft was visualized by angiography. The graft exhibits no disease. Ost 2nd Diag lesion is 100% stenosed. SVG-Diag2 graft was visualized by angiography and is normal in caliber. The graft exhibits no disease --collateral branches fill 2nd Mrg into the distal Cx. Prox RCA to Dist RCA lesion is 100% stenosed - known CTO. NEW: Seq SVG- RPAV-dRCA graft was visualized by angiography and is large. Prox Graft to Mid Graft lesion before Acute Mrg is 100% stenosed. LV end diastolic pressure is mildly elevated.   Newly found ostial occlusion of likely sequential SVG-AM-dRCA (proximal to stent from March 2017).  This is in addition to known CTO of SVG-OM 2 Severe native disease with occluded mid RCA, mid circumflex and mid LAD as well as Diag2 Now 2 out of 4-5 grafts remain patent -SVG-Diag2 is patent with collaterals filling OM 2 and distal Circumflex, LIMA-LAD is patent.   Suspect mild troponin elevation was related to new onset A. fib RVR with ischemic cardiomyopathy and severe existing CAD.  EKG:  EKG is  ordered today.  The ekg ordered today demonstrates NSR rate 59 bpm  Recent Labs: No results found for requested labs within last 365 days.   Recent Lipid Panel    Component Value Date/Time   CHOL 128 11/11/2020 0842   TRIG 87 11/11/2020 0842   HDL 46 11/11/2020 0842   CHOLHDL 2.8 11/11/2020 0842   CHOLHDL 3.8 12/04/2017 1536   VLDL 18 12/04/2017 1536   LDLCALC 65 11/11/2020 0842    Risk Assessment/Calculations:   CHA2DS2-VASc Score = 4   This indicates a 4.8% annual risk of stroke. The patient's score is based upon: CHF History: 1 HTN History: 1 Diabetes History: 0 Stroke History: 0 Vascular Disease History: 1 Age Score: 0 Gender Score: 1     Home Medications   Current Meds  Medication Sig   acetaminophen (TYLENOL) 500 MG tablet Take 1,000 mg by mouth as needed for mild pain.   apixaban (ELIQUIS) 5  MG TABS tablet TAKE 1 TABLET(5 MG) BY MOUTH TWICE DAILY   aspirin EC 81 MG tablet Take 1 tablet (81 mg total) by mouth daily.   atorvastatin (LIPITOR) 80 MG tablet TAKE 1 TABLET(80 MG) BY MOUTH DAILY   furosemide (LASIX) 20 MG tablet Take 1 tablet (20 mg total) by mouth daily.   metoprolol succinate (TOPROL-XL) 100 MG 24 hr tablet Take 1 tablet (100 mg total) by mouth 2 (two) times daily. Take with or immediately following a meal.   nitroGLYCERIN (NITROSTAT) 0.4 MG SL tablet PLEASE SEE ATTACHED FOR DETAILED DIRECTIONS   sacubitril-valsartan (ENTRESTO) 49-51 MG Take 1 tablet by mouth 2 (two) times daily.   triamcinolone cream (KENALOG) 0.1 % Apply 1 application topically at bedtime.     Review of Systems      All other systems reviewed and are otherwise negative except as noted above.  Physical Exam    VS:  BP 134/84   Pulse (!) 59   Ht 5' 2.5" (1.588 m)   Wt 169 lb 12.8 oz (77 kg)   SpO2 99%   BMI 30.56 kg/m  , BMI Body mass index is 30.56 kg/m.  Wt Readings from Last 3 Encounters:  08/19/22 169 lb 12.8 oz (77 kg)  12/01/21 180 lb 9.6 oz (81.9 kg)  05/06/21 170 lb 12.8 oz (77.5 kg)     GEN: Well nourished, well developed, in no acute distress. HEENT: normal. Neck: Supple, no JVD, carotid  bruits, or masses. Cardiac: RRR, no murmurs, rubs, or gallops. No clubbing, cyanosis, edema.  Radials/PT 2+ and equal bilaterally.  Respiratory:  Respirations regular and unlabored, clear to auscultation bilaterally. GI: Soft, nontender, nondistended. MS: No deformity or atrophy. Skin: Warm and dry, no rash. Neuro:  Strength and sensation are intact. Psych: Normal affect.  Assessment & Plan    CAD without angina -stable -continue Eliquis 5 mg twice a day, aspirin 81 mg daily, Lipitor 80 mg daily, Lasix 20 mg daily, metoprolol 100 mg twice a day, nitro as needed, Entresto 49-51 mg twice daily  PAF on chronic anticoagulation -continue Eliquis, no issues with bleeding -NSR today  Chronic HFrEF -euvolemic on exam -no issues with SOB and LE edema -continue current medication regimen  Hypertension -134/84 on repeat -continue current medications  Hyperlipidemia -her primary looks after her lipids -would add a BMP when lipids are done since she is on Entresto  Peripheral vascular disease - no recent issues         Disposition: Follow up 5 months with Christell Constant, MD or APP.  Signed, Sharlene Dory, PA-C 08/19/2022, 5:18 PM [facility]

## 2022-10-18 DEATH — deceased

## 2023-02-16 ENCOUNTER — Ambulatory Visit: Payer: Commercial Managed Care - HMO | Attending: Internal Medicine | Admitting: Internal Medicine

## 2023-02-16 ENCOUNTER — Encounter: Payer: Self-pay | Admitting: Internal Medicine

## 2023-02-16 NOTE — Progress Notes (Deleted)
Cardiology Office Note:    Date:  02/16/2023   ID:  [name], DOB [birth year], MRN [id number]  PCP:  Georgianne Fick, MD  Cardiologist:  Christell Constant, MD   Referring MD: Georgianne Fick, MD   No chief complaint on file.   History of Present Illness:    [name] is a 62 y.o. female with a hx of CAD, prior LAD Promus DES 2003 and ultimately CABG with MV repair in 2007 for CHF related to multivessel CAD and MR. SVG to RCA DES 09/2015. Most recent LVEF 35-40%).  PAD involving left lower extremity.  She is doing well.  This is a post emergency room visit for syncope in December.  She explains that she was at the hospital with her husband who had a stroke.  She was stressed out related to it.  She developed warmth, weakness, nausea, and eventually fainted.  She was worked up and had a mild elevation in troponin but no symptoms to suggest ischemia/angina.  She has done well since then.  She denies orthopnea, PND, chest pain, and recurrent syncope.  She is compliant with her medications.  She feels back to normal.  Her husband is now home but has a long way to progress to be functional and independent.  He has aphasia and difficulty walking.  Past Medical History:  Diagnosis Date   Atrial fibrillation [facility])    Atrial flutter (HCC)    CHF (congestive heart failure) (HCC)    Coronary atherosclerosis of native coronary artery    Echocardiogram    Echocardiogram 7/22: EF 40-45, inf and inf-lat AK, mod reduced RVSF, severe LAE, trivial MR, mild AI, mild AV sclerosis w/o AS   HTN (hypertension)    Hx of CABG    LIMA-LAD, SVG-DIAG, SVG-OM, SVG-RCA   Hypercholesteremia    Myocardial infarct [facility]) 2007   NSTEMI (non-ST elevated myocardial infarction) (HCC) 10/13/2015   Obesity     Past Surgical History:  Procedure Laterality Date   ABDOMINAL HYSTERECTOMY     BIOPSY  12/16/2019   Procedure: BIOPSY;  Surgeon: Lemar Lofty., MD;  Location: [facility] ENDOSCOPY;   Service: Gastroenterology;;   CARDIAC CATHETERIZATION N/A 10/14/2015   Procedure: Left Heart Cath and Cors/Grafts Angiography;  Surgeon: Marykay Lex, MD;  Location: [facility] INVASIVE CV LAB;  Service: Cardiovascular;  Laterality: N/A;   CARDIAC CATHETERIZATION N/A 10/14/2015   Procedure: Coronary Stent Intervention;  Surgeon: Marykay Lex, MD;  Location: [facility] INVASIVE CV LAB;  Service: Cardiovascular;  Laterality: N/A;   CESAREAN SECTION  1985   CORONARY ANGIOPLASTY WITH STENT PLACEMENT  2003   LAD Promus DES /notes 10/13/2015   CORONARY ARTERY BYPASS GRAFT  03/2006   "CABG X3"   ESOPHAGOGASTRODUODENOSCOPY N/A 12/16/2019   Procedure: ESOPHAGOGASTRODUODENOSCOPY (EGD);  Surgeon: Lemar Lofty., MD;  Location: [facility] ENDOSCOPY;  Service: Gastroenterology;  Laterality: N/A;   FOREIGN BODY REMOVAL  12/16/2019   Procedure: FOREIGN BODY REMOVAL;  Surgeon: Meridee Score Netty Starring., MD;  Location: [facility] ENDOSCOPY;  Service: Gastroenterology;;   LEFT HEART CATH AND CORS/GRAFTS ANGIOGRAPHY N/A 12/09/2017   Procedure: LEFT HEART CATH AND CORS/GRAFTS ANGIOGRAPHY;  Surgeon: Marykay Lex, MD;  Location: [facility] INVASIVE CV LAB;  Service: Cardiovascular;  Laterality: N/A;   MITRAL VALVE REPAIR     /notes 10/13/2015   TEE WITHOUT CARDIOVERSION N/A 12/07/2017   Procedure: TRANSESOPHAGEAL ECHOCARDIOGRAM (TEE);  Surgeon: Chrystie Nose, MD;  Location: [facility] ENDOSCOPY;  Service: Cardiovascular;  Laterality: N/A;  Current Medications: No outpatient medications have been marked as taking for the 02/16/23 encounter (Appointment) with Christell Constant, MD.     Allergies:   Other and Adhesive [tape]   Social History   Socioeconomic History   Marital status: Married    Spouse name: Not on file   Number of children: Not on file   Years of education: Not on file   Highest education level: Not on file  Occupational History   Not on file  Tobacco Use   Smoking status: Former    Current packs/day: 1.00     Average packs/day: 1 pack/day for 30.0 years (30.0 ttl pk-yrs)    Types: Cigarettes   Smokeless tobacco: Never   Tobacco comments:    "quit smoking cigarettes in ~ 2007"  Vaping Use   Vaping status: Never Used  Substance and Sexual Activity   Alcohol use: Yes    Alcohol/week: 1.0 standard drink of alcohol    Types: 1 Cans of beer per week    Comment: occ   Drug use: No   Sexual activity: Not Currently  Other Topics Concern   Not on file  Social History Narrative   Not on file   Social Determinants of Health   Financial Resource Strain: Not on file  Food Insecurity: Not on file  Transportation Needs: Not on file  Physical Activity: Not on file  Stress: Not on file  Social Connections: Not on file     Family History: The patient's family history includes Hypertension in her brother, father, and sister; Stroke in her sister. There is no history of Heart attack.  ROS:   Please see the history of present illness.    Highly stressed because of her husband stroke.  All other systems reviewed and are negative.  EKGs/Labs/Other Studies Reviewed:    The following studies were reviewed today: No new cardiac functional or imaging data.  EKG:  EKG EKG from that emergency room visit July 02, 2019 demonstrated T wave abnormality precordial and inferior leads unchanged from prior tracings.  Recent Labs: No results found for requested labs within last 365 days.  Recent Lipid Panel    Component Value Date/Time   CHOL 128 11/11/2020 0842   TRIG 87 11/11/2020 0842   HDL 46 11/11/2020 0842   CHOLHDL 2.8 11/11/2020 0842   CHOLHDL 3.8 12/04/2017 1536   VLDL 18 12/04/2017 1536   LDLCALC 65 11/11/2020 0842    Physical Exam:    VS:  There were no vitals taken for this visit.    Wt Readings from Last 3 Encounters:  08/19/22 169 lb 12.8 oz (77 kg)  12/01/21 180 lb 9.6 oz (81.9 kg)  05/06/21 170 lb 12.8 oz (77.5 kg)     GEN: Mildly overweight. No acute distress HEENT:  Normal NECK: No JVD. LYMPHATICS: No lymphadenopathy CARDIAC: S4 gallop RRR with 2/6 left lower sternal systolic murmur, but no S3gallop, or edema. VASCULAR:  Normal Pulses. No bruits. RESPIRATORY:  Clear to auscultation without rales, wheezing or rhonchi  ABDOMEN: Soft, non-tender, non-distended, No pulsatile mass, MUSCULOSKELETAL: No deformity  SKIN: Warm and dry NEUROLOGIC:  Alert and oriented x 3 PSYCHIATRIC:  Normal affect   ASSESSMENT:    No diagnosis found.  PLAN:    CAD s/p CABG, prior LAD DES HLD -continue Eliquis 5 mg twice a day, aspirin 81 mg daily, Lipitor 80 mg daily, Lasix 20 mg daily, metoprolol 100 mg twice a day, nitro as needed, Entresto 49-51 mg twice  daily  PAF on chronic anticoagulation -continue Eliquis, no issues with bleeding -NSR today  Chronic HFrEF (combined) HTN -euvolemic on exam -no issues with SOB and LE edema -continue current medication regimen   Peripheral vascular disease - no recent issues, sees Dr. Allyson Sabal    Medication Adjustments/Labs and Tests Ordered: Current medicines are reviewed at length with the patient today.  Concerns regarding medicines are outlined above.  No orders of the defined types were placed in this encounter.  No orders of the defined types were placed in this encounter.   There are no Patient Instructions on file for this visit.   Signed, Christell Constant, MD  02/16/2023 9:58 AM    [facility]
